# Patient Record
Sex: Female | Born: 1985 | Race: Black or African American | Hispanic: No | Marital: Married | Smoking: Never smoker
Health system: Southern US, Community
[De-identification: ages and names within clinical notes are randomized; demographics above are authoritative.]

## PROBLEM LIST (undated history)

## (undated) ENCOUNTER — Inpatient Hospital Stay (HOSPITAL_COMMUNITY): Payer: Self-pay

## (undated) HISTORY — PX: NO PAST SURGERIES: SHX2092

---

## 2008-01-25 ENCOUNTER — Inpatient Hospital Stay (HOSPITAL_COMMUNITY): Admission: AD | Admit: 2008-01-25 | Discharge: 2008-01-25 | Payer: Self-pay | Admitting: Obstetrics & Gynecology

## 2008-01-28 ENCOUNTER — Inpatient Hospital Stay (HOSPITAL_COMMUNITY): Admission: AD | Admit: 2008-01-28 | Discharge: 2008-01-28 | Payer: Self-pay | Admitting: Obstetrics and Gynecology

## 2008-02-06 ENCOUNTER — Inpatient Hospital Stay (HOSPITAL_COMMUNITY): Admission: RE | Admit: 2008-02-06 | Discharge: 2008-02-06 | Payer: Self-pay | Admitting: Obstetrics & Gynecology

## 2008-02-16 ENCOUNTER — Encounter: Payer: Self-pay | Admitting: Family Medicine

## 2008-03-02 ENCOUNTER — Encounter: Payer: Self-pay | Admitting: Family Medicine

## 2008-03-23 ENCOUNTER — Encounter: Payer: Self-pay | Admitting: Family Medicine

## 2008-08-05 ENCOUNTER — Ambulatory Visit: Payer: Self-pay | Admitting: Obstetrics & Gynecology

## 2008-08-05 ENCOUNTER — Inpatient Hospital Stay (HOSPITAL_COMMUNITY): Admission: AD | Admit: 2008-08-05 | Discharge: 2008-08-12 | Payer: Self-pay | Admitting: Obstetrics & Gynecology

## 2008-08-10 ENCOUNTER — Encounter: Payer: Self-pay | Admitting: Family

## 2008-12-13 ENCOUNTER — Encounter: Payer: Self-pay | Admitting: Family Medicine

## 2008-12-26 IMAGING — US US OB TRANSVAGINAL
1 series · 14 of 18 positions shown · non-contrast
Comparison: none

OBSTETRICAL ULTRASOUND:
 This ultrasound exam was performed in the [HOSPITAL] Ultrasound Department.  The OB US report was generated in the AS system, and faxed to the ordering physician.  This report is also available in [REDACTED] PACS.

[Series 1: us ob transvaginal · 0.11mm/px · 14 of 18 slices shown]
[im 1/18]
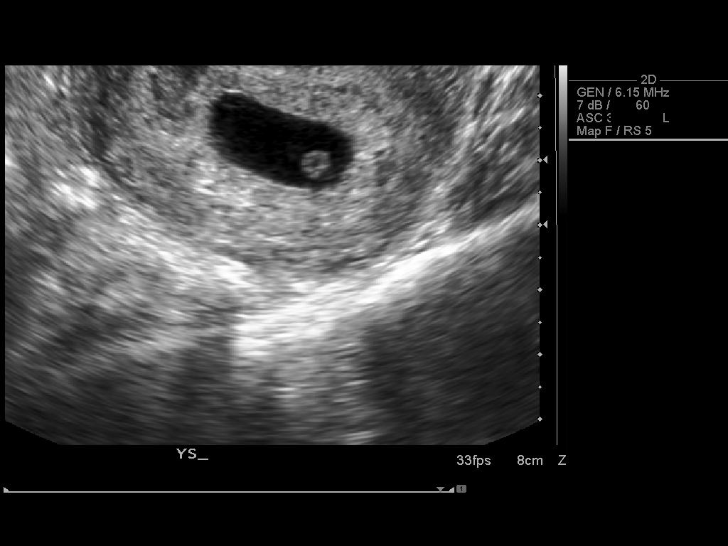
[im 2/18]
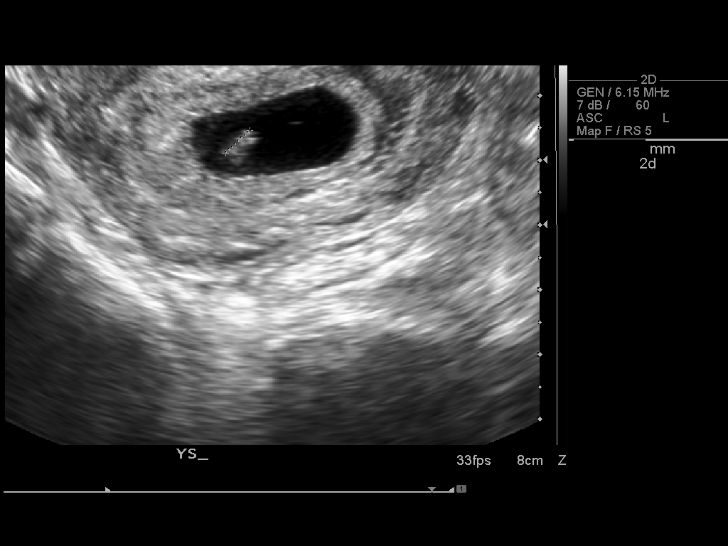
[im 4/18]
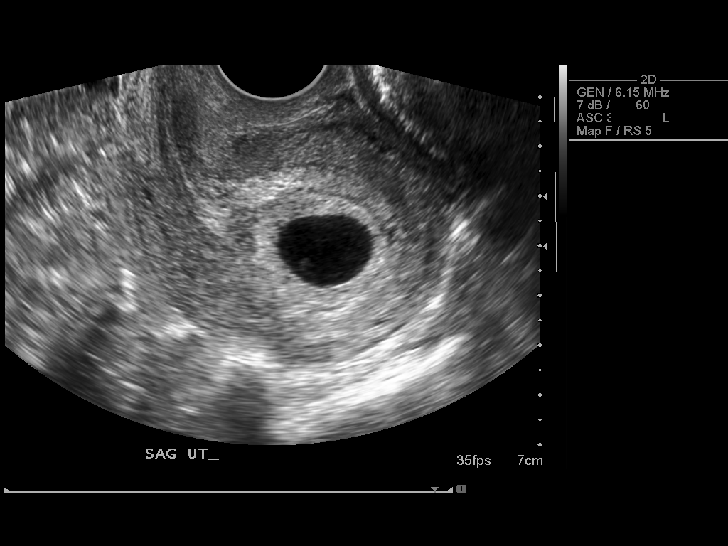
[im 5/18]
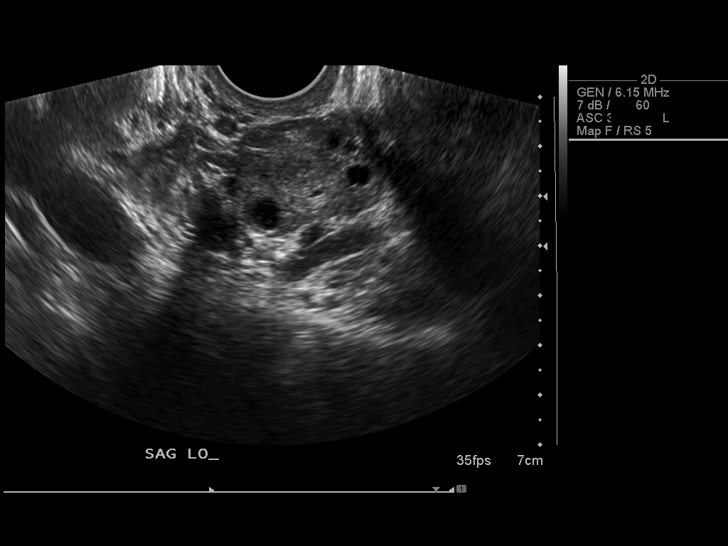
[im 6/18]
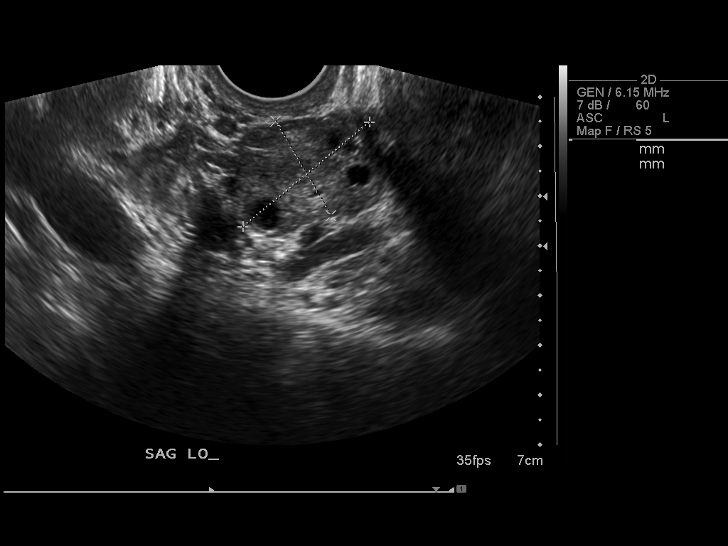
[im 8/18]
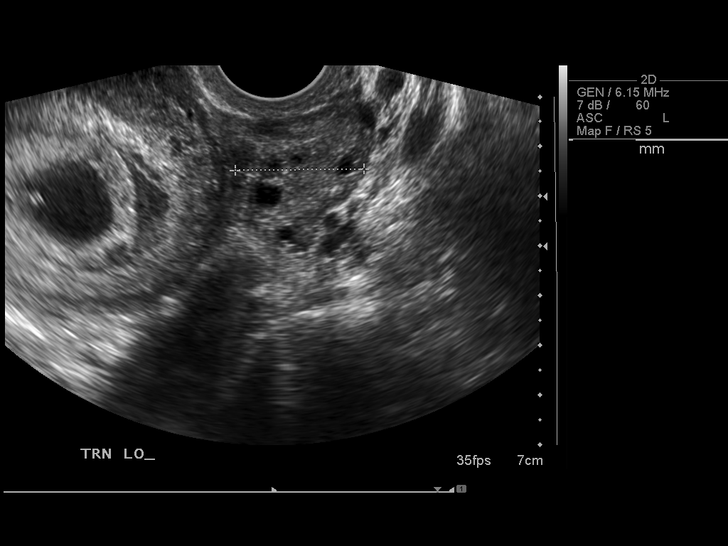
[im 9/18]
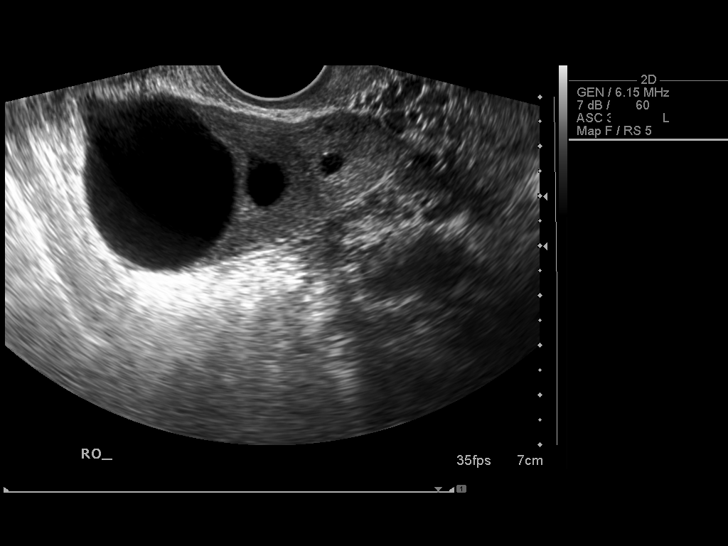
[im 10/18]
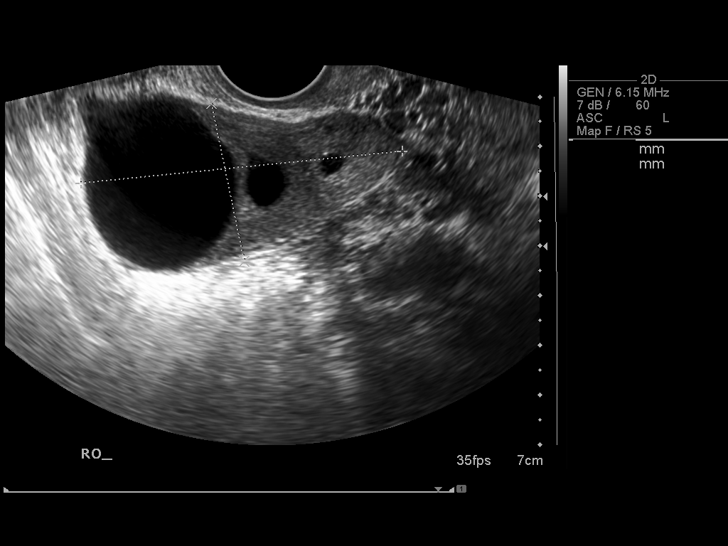
[im 11/18]
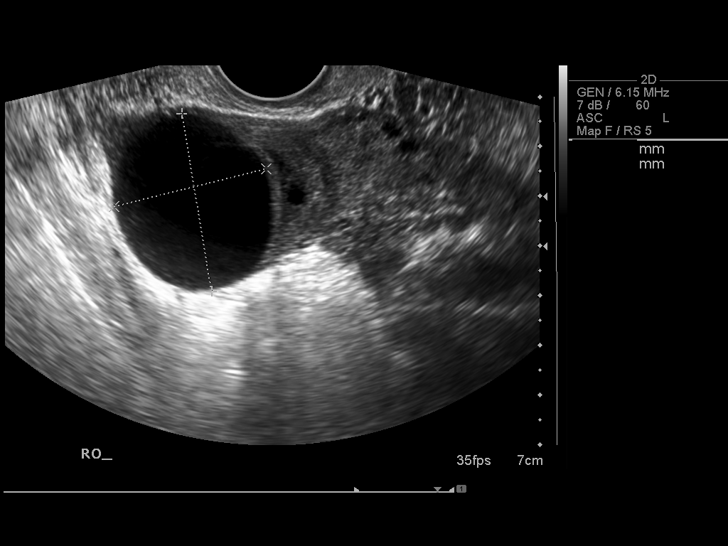
[im 13/18]
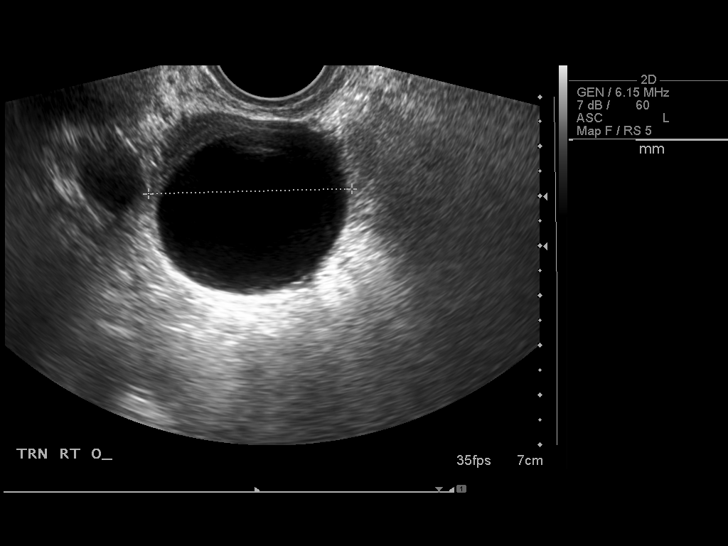
[im 14/18]
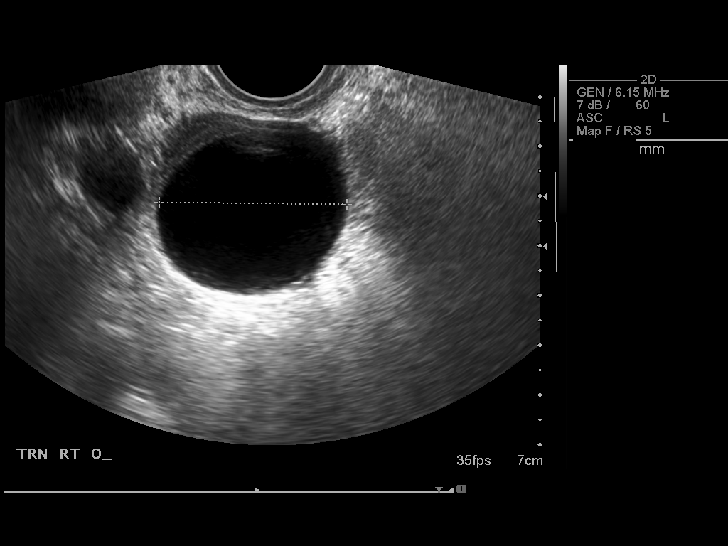
[im 15/18]
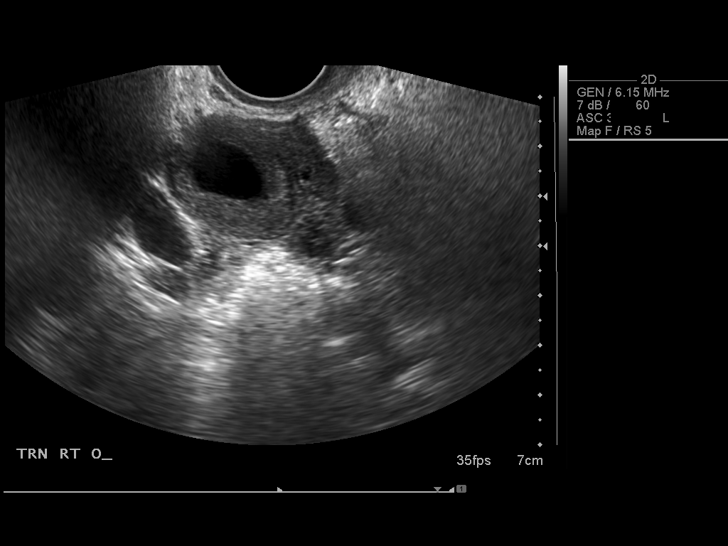
[im 17/18]
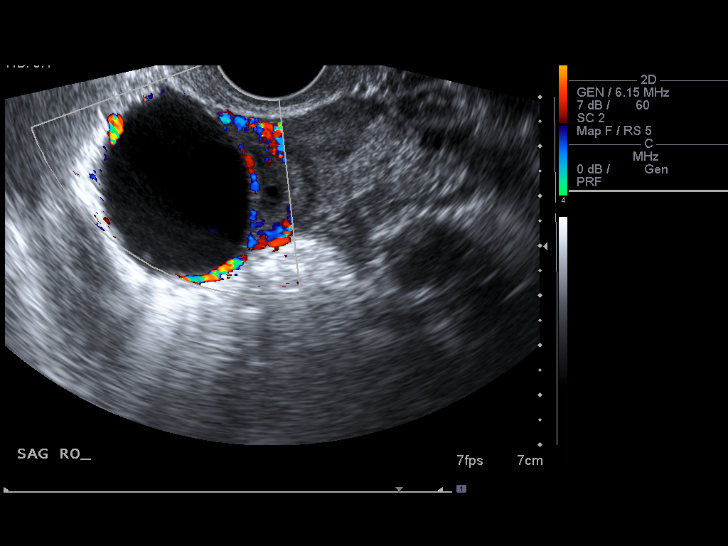
[im 18/18]
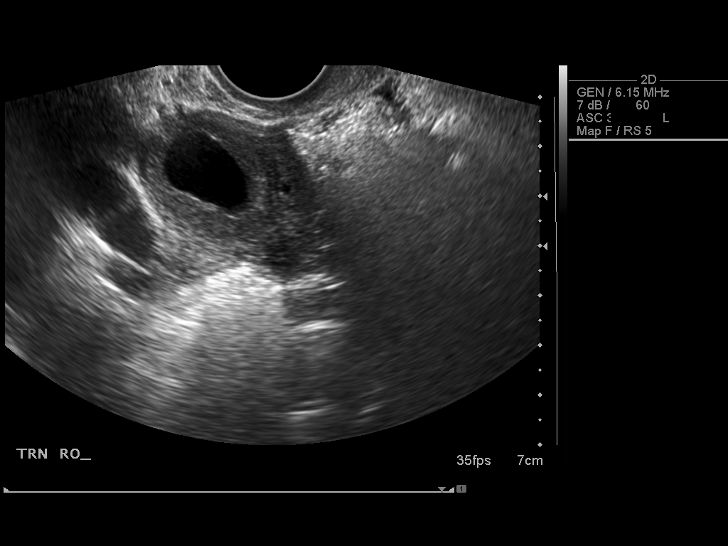

[14 of 18 positions shown; findings below may reference images not displayed]

IMPRESSION: See AS Obstetric US report.

## 2009-02-25 ENCOUNTER — Encounter: Payer: Self-pay | Admitting: Family Medicine

## 2009-03-14 ENCOUNTER — Emergency Department (HOSPITAL_COMMUNITY): Admission: EM | Admit: 2009-03-14 | Discharge: 2009-03-15 | Payer: Self-pay | Admitting: Emergency Medicine

## 2009-04-04 ENCOUNTER — Encounter (INDEPENDENT_AMBULATORY_CARE_PROVIDER_SITE_OTHER): Payer: Self-pay | Admitting: *Deleted

## 2009-05-08 ENCOUNTER — Encounter: Payer: Self-pay | Admitting: Family Medicine

## 2009-05-08 ENCOUNTER — Ambulatory Visit: Payer: Self-pay | Admitting: Family Medicine

## 2009-05-08 DIAGNOSIS — D352 Benign neoplasm of pituitary gland: Secondary | ICD-10-CM | POA: Insufficient documentation

## 2009-05-08 DIAGNOSIS — D353 Benign neoplasm of craniopharyngeal duct: Secondary | ICD-10-CM

## 2009-05-08 DIAGNOSIS — N912 Amenorrhea, unspecified: Secondary | ICD-10-CM

## 2009-05-08 LAB — CONVERTED CEMR LAB: Beta hcg, urine, semiquantitative: NEGATIVE

## 2009-05-20 ENCOUNTER — Telehealth: Payer: Self-pay | Admitting: Family Medicine

## 2009-06-12 ENCOUNTER — Encounter: Payer: Self-pay | Admitting: Family Medicine

## 2009-06-12 ENCOUNTER — Ambulatory Visit: Payer: Self-pay | Admitting: Family Medicine

## 2009-06-12 LAB — CONVERTED CEMR LAB: Prolactin: 29.4 ng/mL

## 2009-06-18 ENCOUNTER — Encounter: Payer: Self-pay | Admitting: Family Medicine

## 2009-07-08 ENCOUNTER — Encounter: Payer: Self-pay | Admitting: Family Medicine

## 2009-07-08 DIAGNOSIS — D573 Sickle-cell trait: Secondary | ICD-10-CM

## 2009-11-15 ENCOUNTER — Ambulatory Visit: Payer: Self-pay | Admitting: Family Medicine

## 2009-11-18 ENCOUNTER — Encounter: Payer: Self-pay | Admitting: Family Medicine

## 2010-01-01 ENCOUNTER — Encounter: Payer: Self-pay | Admitting: Family Medicine

## 2010-06-20 ENCOUNTER — Telehealth: Payer: Self-pay | Admitting: Family Medicine

## 2010-08-18 ENCOUNTER — Ambulatory Visit: Payer: Self-pay | Admitting: Family Medicine

## 2010-08-18 ENCOUNTER — Encounter: Payer: Self-pay | Admitting: Family Medicine

## 2010-08-18 DIAGNOSIS — E559 Vitamin D deficiency, unspecified: Secondary | ICD-10-CM | POA: Insufficient documentation

## 2010-08-18 LAB — CONVERTED CEMR LAB
ALT: 13 units/L (ref 0–35)
AST: 14 units/L (ref 0–37)
Albumin: 4.2 g/dL (ref 3.5–5.2)
Alkaline Phosphatase: 47 units/L (ref 39–117)
CO2: 25 meq/L (ref 19–32)
Calcium: 9.4 mg/dL (ref 8.4–10.5)
Creatinine, Ser: 0.71 mg/dL (ref 0.40–1.20)
HDL: 62 mg/dL (ref >39)
Potassium: 4.2 meq/L (ref 3.5–5.3)
Sodium: 140 meq/L (ref 135–145)
Total CHOL/HDL Ratio: 3.5
Total Protein: 7.3 g/dL (ref 6.0–8.3)
Triglycerides: 80 mg/dL (ref <150)

## 2010-08-21 ENCOUNTER — Ambulatory Visit: Payer: Self-pay | Admitting: Family Medicine

## 2010-08-21 ENCOUNTER — Encounter: Payer: Self-pay | Admitting: Family Medicine

## 2010-08-21 LAB — CONVERTED CEMR LAB: Beta hcg, urine, semiquantitative: NEGATIVE

## 2010-11-04 NOTE — Assessment & Plan Note (Signed)
Summary: gyn exam,fu/tcb   Vital Signs:  Patient profile:   25 year old female Height:      65.25 inches Weight:      122 pounds BMI:     20.22 BSA:     1.61 Temp:     98.5 degrees F Pulse rate:   94 / minute BP sitting:   127 / 75  Vitals Entered By: Jone Baseman CMA (November 15, 2009 9:25 AM) CC: Annual Gyn, Prolactinoma Is Patient Diabetic? No Pain Assessment Patient in pain? no        Primary Care Provider:  Delbert Harness MD  CC:  Annual Gyn and Prolactinoma.  History of Present Illness: 25 yo here for follow-up of pituitary adenoma and annual gynecological exam.  LMP:  currently ending today. Contraception: tri-sprintec- irregular periods due to bleeding when forgetting pills. Regular Menses: yes, when rememebers OCP FHx of Breast, Uterine, Cervical or Ovarian Cancer:  No Last Pap:  about a year ago?   Hx of Abnormal Pap:  No Desires STD testing: No Last Mammogram:  N/A Abnormalities on Self-exam:  No Hx of Abnormal Mammogram:  No  Pituitary Adenoma: Patient uncomfortable with not having endocrinology follow-up.  unable to find referral for her at last visit throught [facility] due to no insurance.  Patient says she is willing to pay for consultation.  Patient continues to be asymptomatic.   Mesntruating, no longer lactating (baby is now 74 months old)  No visual changes, headaches, dizziness, nausea, or other signs of mass effect. prolactin checked 06/12/09: 29.4   Habits & Providers  Alcohol-Tobacco-Diet     Tobacco Status: never  Allergies: No Known Drug Allergies  Review of Systems      See HPI General:  Denies fever, weakness, and weight loss. Eyes:  Denies blurring. CV:  Denies chest pain or discomfort and shortness of breath with exertion. Resp:  Denies cough and wheezing. GI:  Denies abdominal pain. GU:  Denies abnormal vaginal bleeding. Neuro:  Denies numbness, visual disturbances, and weakness.  Physical Exam  General:   Well-developed,well-nourished,in no acute distress; alert,appropriate and cooperative throughout examination Eyes:  No corneal or conjunctival inflammation noted. EOMI. Perrla.  Nose:  External nasal examination shows no deformity or inflammation. Nasal mucosa are pink and moist without lesions or exudates. Mouth:  Oral mucosa and oropharynx without lesions or exudates.  Teeth in good repair. Neck:  No deformities, masses, or tenderness noted. no masses.   Breasts:  No mass, nodules, thickening, tenderness, bulging, retraction, inflamation, nipple discharge or skin changes noted.   Lungs:  Normal respiratory effort, chest expands symmetrically. Lungs are clear to auscultation, no crackles or wheezes. Heart:  Normal rate and regular rhythm. S1 and S2 normal without gallop, murmur, click, rub or other extra sounds. Abdomen:  Bowel sounds positive,abdomen soft and non-tender without masses, organomegaly or hernias noted. Genitalia:  Pelvic Exam:        External: normal female genitalia without lesions or masses        Vagina: normal without lesions or masses.  Light menstration noted.        Cervix: normal without lesions or masses        Adnexa: normal bimanual exam without masses or fullness        Uterus: normal by palpation        Pap smear: performed Extremities:  no LE edema   Impression & Recommendations:  Problem # 1:  Gynecological examination-routine (ICD-V72.31) Performed pap as she is  now 23.  Discussed contraception options.  patient prefers somethign she does not have to remember daily or weekly and does not like idea of NuvaRing. Not doing well remembering OCP's.  Discussed opions of depo shot which caused breakthrough bleeding in the past for her vs Implanon vs Mirena.  Demonstarted Mirena and given informational pamphlet.  patient to think about options and contact health dept for pricing of implanon or offered depo or mirena placement here.  Continue OCP's for now  Advised  daily multivitamin for reproductive age female.  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  Orders: Endocrinology Referral (Endocrine)  Problem # 3:  CONTRACEPTIVE MANAGEMENT (ICD-V25.09) See #1  Complete Medication List: 1)  Sprintec 28 0.25-35 Mg-mcg Tabs (Norgestimate-eth estradiol) .... Take one tablet at the same time daily  Other Orders: Pap Smear-[facility] (91478-29562) [facility] - Est  18-39 yrs (13086)  Patient Instructions: 1)  Take a multivitamin daily 2)  Think about options: Mirena (here in office), vs Depo shot, vs Implanon (under skin). 3)  We will call you with endocrine referral info.  if you do not hear from [place] in 1-2 weeks, please call.  You wil likely have to pay out of pocket for this visit.

## 2010-11-04 NOTE — Miscellaneous (Signed)
Summary: Consent: Implanon Insertion  Consent: Implanon Insertion   Imported By: Knox Royalty 08/22/2010 12:11:00  _____________________________________________________________________  External Attachment:    Type:   Image     Comment:   External Document

## 2010-11-04 NOTE — Op Note (Signed)
Summary: Implanon Insertion  Implanon Insertion   Imported By: De Nurse 08/26/2010 11:18:14  _____________________________________________________________________  External Attachment:    Type:   Image     Comment:   External Document

## 2010-11-04 NOTE — Progress Notes (Signed)
Summary: refill  Phone Note Refill Request Call back at Home Phone [phone] Message from:  Patient  Refills Requested: Medication #1:  SPRINTEC 28 0.25-35 MG-MCG TABS Take one tablet at the same time daily. [facility] [address]  Initial call taken by: De Nurse,  June 20, 2010 8:33 AM    Prescriptions: SPRINTEC 28 0.25-35 MG-MCG TABS (NORGESTIMATE-ETH ESTRADIOL) Take one tablet at the same time daily  #1 pack x 5   Entered and Authorized by:   Delbert Harness MD   Signed by:   Delbert Harness MD on 06/20/2010   Method used:   Electronically to        [facility] [address] [id number]* (retail)       [address] [place], [place]  [address]       Ph: [phone]       Fax: [phone]   RxID:   [id number]

## 2010-11-04 NOTE — Assessment & Plan Note (Signed)
Summary: contraception,df   Vital Signs:  Patient profile:   25 year old female Height:      65.25 inches Weight:      124 pounds BMI:     20.55 Temp:     97.7 degrees F oral Pulse rate:   77 / minute BP sitting:   115 / 81  (right arm) Cuff size:   regular  Vitals Entered By: Tessie Fass CMA (August 18, 2010 8:54 AM) CC: change birth control Is Patient Diabetic? No Pain Assessment Patient in pain? no        Primary Care Elior Robinette:  Delbert Harness MD  CC:  change birth control.  History of Present Illness: 25 yo here to discuss contraception and fill out health forms  contraception: wants to switch from [facility] because difficult to remember every day. Also is in school to get bachelor's degree and wants a long term solution for contraception but does plan on having more children in the future.  Currenlty still on birth control pills.  Last intercourse 2 days ago.  forms:  needs wellness screening form filled out for her workplace  Habits & Providers  Alcohol-Tobacco-Diet     Tobacco Status: never  Current Medications (verified): 1)  Sprintec 28 0.25-35 Mg-Mcg Tabs (Norgestimate-Eth Estradiol) .... Take One Tablet At The Same Time Daily 2)  Vitamin D3 5000 Unit Caps (Cholecalciferol) .... One Tablet Daily- Otc Per Endocrinology  Allergies (verified): No Known Drug Allergies PMH-FH-SH reviewed for relevance  Review of Systems      See HPI  Physical Exam  General:  Well-developed,well-nourished,in no acute distress; alert,appropriate and cooperative throughout examination   Impression & Recommendations:  Problem # 1:  CONTRACEPTIVE MANAGEMENT (ICD-V25.09) Discussed birth control options.  Will plan on implanon this week.  Discussed risks side effects- given patient information to review.  Orders: Comp Met-[facility] ([id number]) [facility]- Est Level  3 (63875)  Problem # 2:  Preventive Health Care (ICD-V70.0)  Will get fasting lipids, glucose for her workplace wellness  form.  Orders: [facility]- Est Level  3 (64332)  Complete Medication List: 1)  Sprintec 28 0.25-35 Mg-mcg Tabs (Norgestimate-eth estradiol) .... Take one tablet at the same time daily 2)  Vitamin D3 5000 Unit Caps (Cholecalciferol) .... One tablet daily- otc per endocrinology  Other Orders: Lipid-[facility] ([id number]) Vit D, 25 OH-[facility] (63016-01093)  Patient Instructions: 1)  See implanon info 2)  Please schedule for this thursday at 10:15 for 30 minute spot   Orders Added: 1)  Lipid-[facility] [[id number]] 2)  Vit D, 25 OH-[facility] [82306-85810] 3)  Comp Met-[facility] [80053-22900] 4)  [facility]- Est Level  3 [23557]

## 2010-11-04 NOTE — Letter (Signed)
Summary: Results Follow-up Letter  [facility]  [address]   [place], [place] [address]   Phone: [phone]  Fax: [phone]    11/18/2009  [address] [place], [place]  [address]  [place]  Dear Ms. MILIUS,   The following are the results of your recent test(s):  Test     Result     Pap Smear    Normal  Sincerely,  Delbert Harness MD [facility]           Appended Document: Results Follow-up Letter mailed.

## 2010-11-04 NOTE — Consult Note (Signed)
Summary: Endocrinology Consult: prolactinoma  Endocrinology Consult   Imported By: De Nurse 01/13/2010 16:01:43  _____________________________________________________________________  External Attachment:    Type:   Image     Comment:   External Document

## 2010-11-04 NOTE — Assessment & Plan Note (Signed)
Summary: Implanon Insertion   Vital Signs:  Patient profile:   25 year old female Height:      65.25 inches Weight:      126 pounds BMI:     20.88 Temp:     98.6 degrees F oral Pulse rate:   64 / minute BP sitting:   119 / 78  (left arm) Cuff size:   regular  Vitals Entered By: Garen Grams LPN (August 21, 2010 10:33 AM) CC: Implanon Insertion Is Patient Diabetic? No Pain Assessment Patient in pain? no        Primary Care Provider:  Delbert Harness MD  CC:  Implanon Insertion.  History of Present Illness: 25 yo here for implanon insertion  patient has reveiwed infromation given at last visit.  She wishes to proceed.  We reviewed side effects which include irregular bleeding,  and possiblity of infect, need for surgical removal, etc as outlined in Implasnon consent form.  [facility] consent signed.    LMP 08/18/2010 Taking ortho tri cyclen currently  Habits & Providers  Alcohol-Tobacco-Diet     Tobacco Status: never  Current Medications (verified): 1)  Implanon 68 Mg Impl (Etonogestrel) .... Placed 08/21/2010 2)  Ergocalciferol 50000 Unit Caps (Ergocalciferol) .... Take One Tablet Weekly For 8 Weeks  Allergies: No Known Drug Allergies  Past History:  Past Medical History: Pituitary adenoma  Social History: Pt lives with her son Melina Modena) born in November 2009 and father of baby Charline Bills Diarassoaba).  She works full time in [employer] and is also a Consulting civil engineer.  She does not drink alcohol, use tobacco, or illicit drugs.  Originally from [place].  Review of Systems      See HPI  Physical Exam  General:  Well-developed,well-nourished,in no acute distress; alert,appropriate and cooperative throughout examination Additional Exam:  Procedure note:  consetrn discuss and signed.  Patient right hand dominant.  left upper arms prepped in sterile procedure.  2 ml 1% lidocaine placed into site in upper arm 8 cm from epicondyle.  Implanon inserted superficially  without complication.  implant palpated in proper position by both provider and patient.  good hemostasis, patient tolerated procesdue well.   Complete Medication List: 1)  Implanon 68 Mg Impl (Etonogestrel) .... Placed 08/21/2010 2)  Ergocalciferol 50000 Unit Caps (Ergocalciferol) .... Take one tablet weekly for 8 weeks  Other Orders: U Preg-[facility] (16109) Etonogestrel implant system, including implant and supplie (U0454)  Insertion implantable contraceptive capsules   (09811) Insertion, non biodegradable drug deliver implant, (91478)  Patient Instructions: 1)  Make appointment for annual gynecological exam in spring. 2)  Take vitamin d once a week of 8 weeksm then take 1000-2000 International Units per day-available over the counter Prescriptions: ERGOCALCIFEROL 50000 UNIT CAPS (ERGOCALCIFEROL) take one tablet weekly for 8 weeks  #8 x 0   Entered and Authorized by:   Delbert Harness MD   Signed by:   Delbert Harness MD on 08/21/2010   Method used:   Electronically to        [facility] [address] [id number]* (retail)       [address] [place], [place]  [address]       Ph: [phone]       Fax: [phone]   RxID:   [id number]    Orders Added: 1)  U Preg-[facility] [81025] 2)  Etonogestrel implant system, including implant and supplie [J7307] 3)   Insertion implantable contraceptive capsules   [11975] 4)  Insertion, non  biodegradable drug deliver implant, [id number]    Laboratory Results   Urine Tests  Date/Time Received: August 21, 2010 10:39 AM  Date/Time Reported: August 21, 2010 10:46 AM     Urine HCG: negative Comments: ...............test performed by......Marland KitchenBonnie A. [place], MLS (ASCP)cm

## 2010-12-10 ENCOUNTER — Ambulatory Visit (INDEPENDENT_AMBULATORY_CARE_PROVIDER_SITE_OTHER): Payer: 59 | Admitting: Family Medicine

## 2010-12-10 ENCOUNTER — Encounter: Payer: Self-pay | Admitting: Family Medicine

## 2010-12-10 VITALS — BP 113/85 | HR 83 | Temp 98.6°F | Wt 129.2 lb

## 2010-12-10 DIAGNOSIS — Z7184 Encounter for health counseling related to travel: Secondary | ICD-10-CM | POA: Insufficient documentation

## 2010-12-10 DIAGNOSIS — Z124 Encounter for screening for malignant neoplasm of cervix: Secondary | ICD-10-CM

## 2010-12-10 DIAGNOSIS — D353 Benign neoplasm of craniopharyngeal duct: Secondary | ICD-10-CM

## 2010-12-10 DIAGNOSIS — E559 Vitamin D deficiency, unspecified: Secondary | ICD-10-CM

## 2010-12-10 DIAGNOSIS — Z7189 Other specified counseling: Secondary | ICD-10-CM

## 2010-12-10 DIAGNOSIS — B373 Candidiasis of vulva and vagina: Secondary | ICD-10-CM

## 2010-12-10 DIAGNOSIS — Z01419 Encounter for gynecological examination (general) (routine) without abnormal findings: Secondary | ICD-10-CM

## 2010-12-10 LAB — PROLACTIN: Prolactin: 30.5 ng/mL

## 2010-12-10 LAB — CONVERTED CEMR LAB
Prolactin: 30.5 ng/mL
Vit D, 25-Hydroxy: 19 ng/mL — ABNORMAL LOW (ref 30–89)

## 2010-12-10 MED ORDER — FLUCONAZOLE 150 MG PO TABS: 150.0000 mg | ORAL_TABLET | Freq: Once | ORAL | Status: AC

## 2010-12-10 NOTE — Progress Notes (Signed)
  Subjective:    Patient ID: Sarah Prince, female    DOB: 21-Feb-1986, 25 y.o.   MRN: [id number]  HPI Annual Gynecological Exam   Wt Readings from Last 3 Encounters:  12/10/10 129 lb 3.2 oz (58.605 kg)  08/21/10 126 lb (57.153 kg)  08/18/10 124 lb (56.246 kg)   Last period:  Nov 2011 Regular periods: no  Heavy bleeding: no  Sexually active: yes, married Birth control or hormonal therapy:implanon Hx of STD: Patient desires STD screening Dyspareunia: No Hot flashes: No Vaginal discharge:  Dysuria:No   Last mammogram: N/A Breast mass or concerns: No Last Pap: Feb 2011, normal. History of abnormal pap: No  FH of breast, uterine, ovarian, colon cancer: No  Pituitary adenoma:  No visual changes, headaches, dizziness, nausea, or other signs of mass effect.  Amenorrhea secondary to implanon placement.   Review of Systems See HPI    Objective:   Physical Exam  Constitutional: She appears well-developed and well-nourished.  HENT:  Head: Normocephalic and atraumatic.  Right Ear: External ear normal.  Left Ear: External ear normal.  Nose: Nose normal.  Mouth/Throat: Oropharynx is clear and moist. No oropharyngeal exudate.  Eyes: Conjunctivae and EOM are normal. Pupils are equal, round, and reactive to light.       Fundoscopic exam normal  Neck: Normal range of motion. Neck supple. No thyromegaly present.  Cardiovascular: Normal rate, regular rhythm and normal heart sounds.   No murmur heard. Pulmonary/Chest: Effort normal and breath sounds normal. No respiratory distress. She has no wheezes. She has no rhonchi. She has no rales.  Abdominal: Soft. Bowel sounds are normal. There is no hepatosplenomegaly. There is no tenderness. There is no rebound and no guarding.  Genitourinary: Vagina normal and uterus normal. No breast tenderness or discharge. Cervix exhibits no motion tenderness, no discharge and no friability. Right adnexum displays no mass, no tenderness and no fullness. Left  adnexum displays no mass and no tenderness. No bleeding around the vagina.       Thick white discharge consistent with yeast  Lymphadenopathy:    She has no cervical adenopathy.          Assessment & Plan:

## 2010-12-10 NOTE — Assessment & Plan Note (Signed)
Asymtptomatic.  Will check prolactin.  If no significant increase, will check annually.  If significantly elevated, will refer back to endocrinology.

## 2010-12-10 NOTE — Assessment & Plan Note (Signed)
Finished course of weekly vit D and now only 1000 units daily.   Will recheck today.

## 2010-12-10 NOTE — Assessment & Plan Note (Signed)
Today's pap will make 3 normal paps.  Will change to every other year cervical cancer screening.  Continue implanon for contraception, patient happy.  Follow-up in 1 year or sooner if needed.

## 2010-12-10 NOTE — Patient Instructions (Signed)
See recommendations for vaccinations before you go to [place] again. I will send you a letter if labs results normal, otherwise I will call you. Follow-up in 1 year or sooner if needed

## 2010-12-11 ENCOUNTER — Telehealth: Payer: Self-pay | Admitting: Family Medicine

## 2010-12-11 MED ORDER — ERGOCALCIFEROL 1.25 MG (50000 UT) PO CAPS: 50000.0000 [IU] | ORAL_CAPSULE | ORAL | Status: DC

## 2010-12-11 NOTE — Telephone Encounter (Signed)
Spoke with patient about low vitamin D.  Advised I will send in another 8 weeks of vitamin D and advised to take 2000 units daily instead of 1000 after this.  Prolactin level stable, continue annual monitoring.

## 2011-01-12 LAB — URINALYSIS, ROUTINE W REFLEX MICROSCOPIC
Bilirubin Urine: NEGATIVE
Glucose, UA: NEGATIVE mg/dL
Hgb urine dipstick: NEGATIVE
Specific Gravity, Urine: 1.01 (ref 1.005–1.030)

## 2011-01-12 LAB — URINE MICROSCOPIC-ADD ON

## 2011-01-12 LAB — POCT PREGNANCY, URINE: Preg Test, Ur: NEGATIVE

## 2011-01-19 ENCOUNTER — Encounter: Payer: Self-pay | Admitting: Family Medicine

## 2011-01-19 ENCOUNTER — Ambulatory Visit (INDEPENDENT_AMBULATORY_CARE_PROVIDER_SITE_OTHER): Payer: 59 | Admitting: Family Medicine

## 2011-01-19 VITALS — BP 108/64 | HR 102 | Temp 98.5°F | Ht 66.54 in | Wt 129.0 lb

## 2011-01-19 DIAGNOSIS — Z7184 Encounter for health counseling related to travel: Secondary | ICD-10-CM

## 2011-01-19 DIAGNOSIS — N760 Acute vaginitis: Secondary | ICD-10-CM

## 2011-01-19 DIAGNOSIS — B373 Candidiasis of vulva and vagina: Secondary | ICD-10-CM

## 2011-01-19 DIAGNOSIS — Z7189 Other specified counseling: Secondary | ICD-10-CM

## 2011-01-19 LAB — POCT WET PREP (WET MOUNT)
Clue Cells Wet Prep HPF POC: NEGATIVE
Trichomonas Wet Prep HPF POC: NEGATIVE

## 2011-01-19 MED ORDER — FLUCONAZOLE 150 MG PO TABS: 150.0000 mg | ORAL_TABLET | Freq: Once | ORAL | Status: AC

## 2011-01-19 NOTE — Progress Notes (Signed)
  Subjective:    Patient ID: Sarah Prince, female    DOB: 02-Jul-1986, 25 y.o.   MRN: [id number]  HPIIncreased vaginal spotting, Implanon since November.  Does not have periods, just brown spotting that is irregular.  She is going to [place] in [place] in June and would like to know what immunizations that she needs for her and her son.  She is up to date on all recommended for the [place], yellow fever, and meningococcus  Will need to be determined by the health department.  She and son will also need malaria prophylaxis.       Review of Systems  Constitutional: Negative for fever and chills.  Genitourinary: Positive for vaginal bleeding, vaginal discharge and menstrual problem. Negative for dysuria, vaginal pain and dyspareunia.       Objective:   Physical Exam  Constitutional: She appears well-developed and well-nourished.  Genitourinary: Uterus normal. Vaginal discharge found.       Brown discharge from cervix, vaginal walls red and moist.          Assessment & Plan:

## 2011-01-19 NOTE — Assessment & Plan Note (Signed)
Treat with oral fluconazole and one refill

## 2011-01-19 NOTE — Patient Instructions (Addendum)
Please contact the health department at [phone] to determine if you need yellow fever and malaria prophylaxis  They will be able to determine all your needs   Implanon is associated with irregular spotting, this will likely occur the duration of it presence (3 years)

## 2011-01-19 NOTE — Assessment & Plan Note (Signed)
Reviewed immunizations recommended, and her own record she will likely need yellow fever and malaria prophylaxis, referred to the [facility] Department for this.

## 2011-01-20 ENCOUNTER — Ambulatory Visit: Payer: 59 | Admitting: Family Medicine

## 2011-02-20 NOTE — Discharge Summary (Signed)
NAME:  SHAKEENA, KAFER                  ACCOUNT NO.:  [id number]   MEDICAL RECORD NO.:  [id number]          PATIENT TYPE:  INP   LOCATION:  9310                          FACILITY:  [facility]   PHYSICIAN:  Tanya S. Shawnie Pons, M.D.   DATE OF BIRTH:  1986-01-25   DATE OF ADMISSION:  08/05/2008  DATE OF DISCHARGE:  08/12/2008                               DISCHARGE SUMMARY   REASON FOR ADMISSION:  Pregnancy at 32 weeks and 3 days with premature  rupture of membranes.   DISCHARGE DIAGNOSIS:  Pregnancy, delivered on August 10, 2008,  premature viable female child without any complications.   HOSPITAL COURSE:  The patient was admitted on August 05, 2008, and  given preterm rupture of membrane protocol.  The patient received IV  antibiotics, betamethasone 12 mg IM, and that was repeated 24 hours  later.  She essentially had an uneventful course and spontaneous labor  on August 10, 2008, and had an uneventful delivery at that time of a  viable female child.  Postpartum period was uneventful without any  complication and on August 12, 2008, the patient desired discharge  home.   PHYSICAL EXAMINATION:  Vital signs were stable.  Lungs were clear to  auscultation bilaterally.  Fundus was firm.  Lochia small amount.  Abdomen was soft and nontender.  There was no edema of the lower  extremities.   ASSESSMENT:  Postpartum day #2 stable and she was discharged home to  follow up in 6 weeks at [facility].  She was  undecided about her contraception, so at this time she decided no  contraception was needed until she follows up at the Health Department  for her postpartum checkup.      Zerita Boers, N.M.      Shelbie Proctor. Shawnie Pons, M.D.  Electronically Signed    DL/MEDQ  D:  09/81/1914  T:  09/10/2008  Job:  [id number]

## 2011-06-30 LAB — URINALYSIS, ROUTINE W REFLEX MICROSCOPIC
Bilirubin Urine: NEGATIVE
Ketones, ur: NEGATIVE

## 2011-06-30 LAB — CBC
Hemoglobin: 13.1
MCHC: 34.3

## 2011-06-30 LAB — HCG, QUANTITATIVE, PREGNANCY: hCG, Beta Chain, Quant, S: 8903 — ABNORMAL HIGH

## 2011-06-30 LAB — POCT PREGNANCY, URINE
Operator id: 202651
Preg Test, Ur: POSITIVE

## 2011-06-30 LAB — WET PREP, GENITAL: Trich, Wet Prep: NONE SEEN

## 2011-07-07 LAB — COMPREHENSIVE METABOLIC PANEL
AST: 18
Albumin: 3 — ABNORMAL LOW
BUN: 7
Chloride: 105
Creatinine, Ser: 0.44
GFR calc Af Amer: >60
Potassium: 3.7
Total Bilirubin: 0.5
Total Protein: 6.6

## 2011-07-07 LAB — CBC
Hemoglobin: 12.8
MCHC: 33.6
MCV: 90.3
Platelets: 168
Platelets: 205
RDW: 13.1
RDW: 13.2
WBC: 11.4 — ABNORMAL HIGH

## 2011-07-07 LAB — STREP B DNA PROBE: Strep Group B Ag: NEGATIVE

## 2011-07-07 LAB — GC/CHLAMYDIA PROBE AMP, GENITAL
Chlamydia, DNA Probe: NEGATIVE
GC Probe Amp, Genital: NEGATIVE

## 2011-07-21 ENCOUNTER — Ambulatory Visit: Payer: 59 | Admitting: Family Medicine

## 2011-09-25 ENCOUNTER — Ambulatory Visit: Payer: 59 | Admitting: Family Medicine

## 2011-09-30 ENCOUNTER — Ambulatory Visit: Payer: 59 | Admitting: Family Medicine

## 2011-10-30 ENCOUNTER — Emergency Department (HOSPITAL_COMMUNITY): Payer: BC Managed Care – HMO

## 2011-10-30 ENCOUNTER — Encounter (HOSPITAL_COMMUNITY): Payer: Self-pay | Admitting: *Deleted

## 2011-10-30 ENCOUNTER — Encounter: Payer: Self-pay | Admitting: Family Medicine

## 2011-10-30 ENCOUNTER — Ambulatory Visit (INDEPENDENT_AMBULATORY_CARE_PROVIDER_SITE_OTHER): Payer: BC Managed Care – HMO | Admitting: Family Medicine

## 2011-10-30 ENCOUNTER — Emergency Department (HOSPITAL_COMMUNITY)
Admission: EM | Admit: 2011-10-30 | Discharge: 2011-10-31 | Disposition: A | Discharge status: Home / Self Care | Payer: BC Managed Care – HMO | Attending: Emergency Medicine | Admitting: Emergency Medicine

## 2011-10-30 VITALS — BP 114/72 | HR 111 | Temp 99.8°F | Ht 66.5 in | Wt 137.0 lb

## 2011-10-30 DIAGNOSIS — J069 Acute upper respiratory infection, unspecified: Secondary | ICD-10-CM

## 2011-10-30 DIAGNOSIS — R630 Anorexia: Secondary | ICD-10-CM | POA: Insufficient documentation

## 2011-10-30 DIAGNOSIS — R070 Pain in throat: Secondary | ICD-10-CM | POA: Insufficient documentation

## 2011-10-30 DIAGNOSIS — R5381 Other malaise: Secondary | ICD-10-CM | POA: Insufficient documentation

## 2011-10-30 DIAGNOSIS — N390 Urinary tract infection, site not specified: Secondary | ICD-10-CM | POA: Insufficient documentation

## 2011-10-30 DIAGNOSIS — Z3009 Encounter for other general counseling and advice on contraception: Secondary | ICD-10-CM

## 2011-10-30 DIAGNOSIS — R509 Fever, unspecified: Secondary | ICD-10-CM | POA: Insufficient documentation

## 2011-10-30 DIAGNOSIS — R519 Headache, unspecified: Secondary | ICD-10-CM | POA: Insufficient documentation

## 2011-10-30 DIAGNOSIS — R51 Headache: Secondary | ICD-10-CM | POA: Insufficient documentation

## 2011-10-30 DIAGNOSIS — R05 Cough: Secondary | ICD-10-CM | POA: Insufficient documentation

## 2011-10-30 DIAGNOSIS — Z309 Encounter for contraceptive management, unspecified: Secondary | ICD-10-CM

## 2011-10-30 DIAGNOSIS — R059 Cough, unspecified: Secondary | ICD-10-CM | POA: Insufficient documentation

## 2011-10-30 DIAGNOSIS — R5383 Other fatigue: Secondary | ICD-10-CM | POA: Insufficient documentation

## 2011-10-30 DIAGNOSIS — R350 Frequency of micturition: Secondary | ICD-10-CM

## 2011-10-30 LAB — BASIC METABOLIC PANEL
BUN: 7 mg/dL (ref 6–23)
CO2: 28 mEq/L (ref 19–32)
Calcium: 9.2 mg/dL (ref 8.4–10.5)
Chloride: 100 mEq/L (ref 96–112)
Creatinine, Ser: 0.75 mg/dL (ref 0.50–1.10)
GFR calc Af Amer: >90 mL/min (ref >90)
GFR calc non Af Amer: >90 mL/min (ref >90)
Glucose, Bld: 109 mg/dL — ABNORMAL HIGH (ref 70–99)
Potassium: 3.2 mEq/L — ABNORMAL LOW (ref 3.5–5.1)
Sodium: 136 mEq/L (ref 135–145)

## 2011-10-30 LAB — URINALYSIS, ROUTINE W REFLEX MICROSCOPIC
Bilirubin Urine: NEGATIVE
Glucose, UA: NEGATIVE mg/dL
Ketones, ur: NEGATIVE mg/dL
Nitrite: NEGATIVE
Protein, ur: 30 mg/dL — AB
Specific Gravity, Urine: 1.011 (ref 1.005–1.030)
Urobilinogen, UA: 4 mg/dL — ABNORMAL HIGH (ref 0.0–1.0)
pH: 6.5 (ref 5.0–8.0)

## 2011-10-30 LAB — POCT URINALYSIS DIPSTICK
Nitrite, UA: POSITIVE
Protein, UA: 100
Urobilinogen, UA: 1
pH, UA: 6

## 2011-10-30 LAB — CBC
HCT: 33.8 % — ABNORMAL LOW (ref 36.0–46.0)
Hemoglobin: 11.9 g/dL — ABNORMAL LOW (ref 12.0–15.0)
MCH: 28.1 pg (ref 26.0–34.0)
MCHC: 35.2 g/dL (ref 30.0–36.0)
MCV: 79.9 fL (ref 78.0–100.0)
Platelets: 214 10*3/uL (ref 150–400)
RBC: 4.23 MIL/uL (ref 3.87–5.11)
RDW: 12.6 % (ref 11.5–15.5)
WBC: 9.6 10*3/uL (ref 4.0–10.5)

## 2011-10-30 LAB — DIFFERENTIAL
Basophils Absolute: 0 10*3/uL (ref 0.0–0.1)
Basophils Relative: 0 % (ref 0–1)
Eosinophils Absolute: 0 10*3/uL (ref 0.0–0.7)
Eosinophils Relative: 0 % (ref 0–5)
Lymphocytes Relative: 15 % (ref 12–46)
Lymphs Abs: 1.4 10*3/uL (ref 0.7–4.0)
Monocytes Absolute: 1.3 10*3/uL — ABNORMAL HIGH (ref 0.1–1.0)
Monocytes Relative: 13 % — ABNORMAL HIGH (ref 3–12)
Neutro Abs: 6.9 10*3/uL (ref 1.7–7.7)
Neutrophils Relative %: 72 % (ref 43–77)

## 2011-10-30 LAB — POCT URINE PREGNANCY: Preg Test, Ur: NEGATIVE

## 2011-10-30 LAB — POCT UA - MICROSCOPIC ONLY

## 2011-10-30 LAB — URINE MICROSCOPIC-ADD ON

## 2011-10-30 LAB — RAPID STREP SCREEN (MED CTR MEBANE ONLY): Streptococcus, Group A Screen (Direct): NEGATIVE

## 2011-10-30 MED ORDER — SODIUM CHLORIDE 0.9 % IV BOLUS (SEPSIS)
1000.0000 mL | Freq: Once | INTRAVENOUS | Status: AC
  Administered 2011-10-30: 1000 mL via INTRAVENOUS

## 2011-10-30 MED ORDER — IBUPROFEN 800 MG PO TABS
800.0000 mg | ORAL_TABLET | Freq: Once | ORAL | Status: AC
  Administered 2011-10-30: 800 mg via ORAL
  Filled 2011-10-30: qty 1

## 2011-10-30 MED ORDER — FLUCONAZOLE 150 MG PO TABS: 150.0000 mg | ORAL_TABLET | Freq: Once | ORAL | Status: DC

## 2011-10-30 MED ORDER — ACETAMINOPHEN 500 MG PO TABS
1000.0000 mg | ORAL_TABLET | Freq: Once | ORAL | Status: AC
  Administered 2011-10-30: 1000 mg via ORAL
  Filled 2011-10-30: qty 2

## 2011-10-30 MED ORDER — CEPHALEXIN 500 MG PO CAPS: 500.0000 mg | ORAL_CAPSULE | Freq: Two times a day (BID) | ORAL | Status: AC

## 2011-10-30 NOTE — Patient Instructions (Addendum)
It is normal to have a headache with a cold.  Please seek medical care if headache becomes severe, change in vision, numbness, tingling, weakness, or if continues for a week  Take a prenatal vitamin every day to help protect your future pregnancy  You can buy pregnancy tests over the counter.- even from some dollar stores.  Due for annual check in mid march.  See medicine for urinary tract infection   Preparing for Pregnancy Preparing for pregnancy (preconceptual care) by getting counseling and information from your caregiver before getting pregnant is a good idea. It will help you and your baby have a better chance to have a healthy, safe pregnancy and delivery of your baby. Make an appointment with your caregiver to talk about your health, medical, and family history and how to prepare yourself before getting pregnant. Your caregiver will do a complete physical exam and a Pap test. They will want to know:  About you, your spouse or partner, and your family's medical and genetic history.     If you are eating a balanced diet and drinking enough fluids.     What vitamins and mineral supplements you are taking. This includes taking folic acid before getting pregnant to help prevent birth defects.     What medications you are taking including prescription, over-the-counter and herbal medications.     If there is any substance abuse like alcohol, smoking, and illegal drugs.     If there is any mental or physical domestic violence.     If there is any risk of sexually transmitted disease between you and your partner.     What immunizations and vaccinations you have had and what you may need before getting pregnant.     If you should get tested for HIV infection.     If there is any exposure to chemical or toxic substances at home or work.     If there are medical problems you have that need to be treated and kept under control before getting pregnant such as diabetes, high blood pressure  or others.     If there were any past surgeries, pregnancies and problems with them.     What your current weight is and to set a goal as to how much weight you should gain while pregnant. Also, they will check if you should lose or gain weight before getting pregnant.     What is your exercise routine and what it is safe when you are pregnant.     If there are any physical disabilities that need to be addressed.     About spacing your pregnancies when there are other children.     If there is a financial problem that may affect you having a child.  After talking about the above points with your caregiver, your caregiver will give you advice on how to help treat and work with you on solving any issues, if necessary, before getting pregnant. The goal is to have a healthy and safe pregnancy for you and your baby. You should keep an accurate record of your menstrual periods because it will help in determining your due date. Immunizations that you should have before getting pregnant:    Regular measles, [place] measles (rubella) and mumps.     Tetanus and diphtheria.     Chickenpox, if not immune.     Herpes zoster (Varicella) if not immune.     Human papilloma virus vaccine (HPV) between the age of 50 and 24  years old.     Hepatitis A vaccine.     Hepatitis B vaccine.     Influenza vaccine.     Pneumococcal vaccine (pneumonia).  You should avoid getting pregnant for one month after getting vaccinated with a live virus vaccine such as [place] measles (rubella) vaccine. Other immunizations may be necessary depending on where you live, such as malaria. Ask your caregiver if any other immunizations are needed for you. HOME CARE INSTRUCTIONS    Follow the advice of your caregiver.     Before getting pregnant:     Begin taking vitamins, supplements, and 0.4 milligrams folic acid daily.     Get your immunizations up-to-date.     Get help from a nutrition counselor if you do not  understand what a balanced diet is, need help with a special medical diet or if you need help to lose or gain weight.     Begin exercising.     Stop smoking, taking illegal drugs, and drinking alcoholic beverages.     Get counseling if there is and type of domestic violence.     Get checked for sexually transmitted diseases including HIV.     Get any medical problems under control (diabetes, high blood pressure, convulsions, asthma or others).     Resolve any financial concerns.     Be sure you and your spouse or partner are ready to have a baby.     Keep an accurate record of your menstrual periods.  Document Released: 09/03/2008 Document Revised: 06/03/2011 Document Reviewed: 09/03/2008 [facility] Patient Information 2012 [place], [place].

## 2011-10-30 NOTE — ED Notes (Signed)
The pt saw her doctor earlier today and she was given kelflex.  She has not taken any tylenol or advil in the past few hours

## 2011-10-30 NOTE — Progress Notes (Signed)
  Subjective:    Patient ID: Sarah Prince, female    DOB: 1986-02-01, 26 y.o.   MRN: [id number]  HPI  Would like to take a urine pregnancy test and discuss headache  [facility].  Had a positive pregnancy test in mid December, than had a period December 25th.  Is trying to get pregnant.  Not taking PNV.  Headache:  3 days of sinus pressure, mild cough, feeling hot, feeling of drainage down back of throat.  Not abd pain, change in vision, numbness, tingling, weakness.  Has been taking tylenol several times a day,which helps but it very concerned with using tylenol for 3 days.  Urinary frequency:  For past week has had to urinate every few hours  No dysuria, incontinence.  NO bladder pressure or pain.  Review of Systems See above    Objective:   Physical Exam GEN: Alert & Oriented, No acute distress.  No neck stiffness. HEENT: [place]/AT. EOMI, PERRLA, no conjunctival injection or scleral icterus.  Bilateral tympanic membranes intact without erythema or effusion.  .  Nares without edema or rhinorrhea.  Oropharynx is without erythema or exudates.  CV:  Regular Rate & Rhythm, no murmur Respiratory:  Normal work of breathing, CTAB Abd:  + BS, soft, no tenderness to palpation NEURO:  Normal EOMI< PERRLA>  Gait normal.        Assessment & Plan:

## 2011-10-30 NOTE — ED Notes (Signed)
Pt ambulated to RR for CCUS. 

## 2011-10-30 NOTE — Assessment & Plan Note (Addendum)
Keflex x 7 days, will culture. Also treating yeast in urine

## 2011-10-30 NOTE — ED Provider Notes (Signed)
History     CSN: [id number]  Arrival date & time 10/30/11  2102   First MD Initiated Contact with Patient 10/30/11 2141      Chief Complaint  Patient presents with  . Headache    (Consider location/radiation/quality/duration/timing/severity/associated sxs/prior treatment)  HPI 26 year old AA female presents with headache that began about 3 days ago. The onset was gradual. She denies nausea and photophobia. She describes the headache as bilateral, temporal, and intermittent. She also has a sore throat, fever, chills, and cough. She denies nasal congestion and sinus pressure. She reports poor appetite in the past 3 days. She went to her PCP earlier today who gave her a Rx for Keflex for UTI. She denies any dysuria, abdominal pain, nausea, and vomiting. She reports that she noticed her urine was malodorous.   Past Medical History  Diagnosis Date  . Prolactinoma     History reviewed. No pertinent past surgical history.  Family History  Problem Relation Age of Onset  . Diabetes Mother     History  Substance Use Topics  . Smoking status: Never Smoker   . Smokeless tobacco: Not on file  . Alcohol Use: No    OB History    Grav Para Term Preterm Abortions TAB SAB Ect Mult Living                  Review of Systems  Constitutional: Positive for fever, chills, appetite change and fatigue.  HENT: Positive for sore throat. Negative for hearing loss, ear pain, congestion, rhinorrhea, sneezing, neck pain, neck stiffness and sinus pressure.   Eyes: Negative for photophobia and visual disturbance.  Respiratory: Positive for cough. Negative for chest tightness and shortness of breath.   Cardiovascular: Negative for chest pain and palpitations.  Gastrointestinal: Negative for nausea, vomiting, abdominal pain and diarrhea.  Genitourinary: Negative for dysuria.  Neurological: Positive for weakness and headaches. Negative for dizziness.    Allergies  Review of patient's allergies  indicates no known allergies.  Home Medications   Current Outpatient Rx  Name Route Sig Dispense Refill  . CEPHALEXIN 500 MG PO CAPS Oral Take 1 capsule (500 mg total) by mouth 2 (two) times daily. 14 capsule 0  . VITAMIN D 1000 UNITS PO TABS Oral Take 1,000 Units by mouth daily.      . ERGOCALCIFEROL 50000 UNITS PO CAPS Oral Take 1 capsule (50,000 Units total) by mouth once a week. 8 capsule 0    BP 106/66  Pulse 124  Temp(Src) 103.1 F (39.5 C) (Oral)  Resp 20  SpO2 100%  LMP 09/29/2011  Physical Exam  Constitutional: She is oriented to person, place, and time. She appears well-developed and well-nourished. No distress.  HENT:  Head: Normocephalic and atraumatic.  Nose: Mucosal edema present. Right sinus exhibits no maxillary sinus tenderness and no frontal sinus tenderness. Left sinus exhibits no maxillary sinus tenderness and no frontal sinus tenderness.  Mouth/Throat: Posterior oropharyngeal erythema present.  Eyes: Conjunctivae are normal. Pupils are equal, round, and reactive to light. Right eye exhibits no discharge. Left eye exhibits no discharge.  Neck: Normal range of motion. Neck supple.  Cardiovascular: Normal rate, regular rhythm, normal heart sounds and intact distal pulses.   Pulmonary/Chest: Effort normal and breath sounds normal. No respiratory distress. She has no wheezes. She has no rales. She exhibits no tenderness.  Abdominal: Soft. Bowel sounds are normal. She exhibits no distension. There is no tenderness.  Musculoskeletal: Normal range of motion.  Neurological: She is  alert and oriented to person, place, and time.  Skin: Skin is warm and dry. She is not diaphoretic.  Psychiatric: She has a normal mood and affect. Her behavior is normal.     ED Course  Procedures (including critical care time)  Labs Reviewed - No data to display  11:35 PM Checked on patient. She states the headache has improved some with Tylenol, Advil, and 1 bolus of NS. Patient's  blood pressure was 84/38. Will give another bolus of fluids and recheck.   The patient seems to several issues going on.  Most likely cause of her fevers and upper respiratory tract infection.  Her urine tonight did not show significant signs of infection but there is a culture pending from earlier urine.  Advised to continue taking the Keflex.  Patient is agreeable to the plan and all questions were answered.  She is advised to increase her fluid intake and return here for any worsening in her symptoms.  Patient has not had any nausea or vomiting per her report display with the nursing note states.  MDM  MDM Reviewed: nursing note and vitals Interpretation: labs and x-ray    Medical screening examination/treatment/procedure(s) were performed by non-physician practitioner and as supervising physician I was immediately available for consultation/collaboration. Osvaldo Human, M.D.        Carlyle Dolly, PA-C 10/31/11 0054  Carleene Cooper III, MD 10/31/11 1019

## 2011-10-30 NOTE — Assessment & Plan Note (Signed)
Trying to get pregnant.  Advised starting PNV.

## 2011-10-30 NOTE — ED Notes (Signed)
MD at bedside. 

## 2011-10-30 NOTE — ED Notes (Signed)
Pt changing into gown

## 2011-10-30 NOTE — Assessment & Plan Note (Signed)
Headache with sinus pressure and post nasal drainage of 3 days in duration.  No red flags.  Discussed reasons for further workup and will need repeat monitoring of prolactin in March for history of pituitary adenoma.  At this point would not pursue any further imaging.  I have asked to return if persists longer than a week, or has new symptoms.

## 2011-10-30 NOTE — ED Notes (Signed)
Headache for 3 days with nv 

## 2011-10-31 MED ORDER — KETOROLAC TROMETHAMINE 30 MG/ML IJ SOLN
30.0000 mg | Freq: Once | INTRAMUSCULAR | Status: AC
  Administered 2011-10-31: 30 mg via INTRAVENOUS
  Filled 2011-10-31: qty 1

## 2011-10-31 MED ORDER — IBUPROFEN 800 MG PO TABS: 800.0000 mg | ORAL_TABLET | Freq: Three times a day (TID) | ORAL | Status: AC | PRN

## 2011-10-31 NOTE — Discharge Instructions (Signed)
Take Tylenol and Motrin for fever.  Continue the antibiotics.  Increase your fluid intake.

## 2011-11-02 ENCOUNTER — Ambulatory Visit: Payer: 59 | Admitting: Family Medicine

## 2011-11-02 LAB — URINE CULTURE: Colony Count: >=100000

## 2011-12-10 ENCOUNTER — Ambulatory Visit (INDEPENDENT_AMBULATORY_CARE_PROVIDER_SITE_OTHER): Payer: BC Managed Care – HMO | Admitting: Family Medicine

## 2011-12-10 ENCOUNTER — Encounter: Payer: Self-pay | Admitting: Family Medicine

## 2011-12-10 VITALS — BP 112/81 | HR 90 | Temp 98.6°F | Ht 66.5 in | Wt 140.0 lb

## 2011-12-10 DIAGNOSIS — N915 Oligomenorrhea, unspecified: Secondary | ICD-10-CM

## 2011-12-10 DIAGNOSIS — E559 Vitamin D deficiency, unspecified: Secondary | ICD-10-CM

## 2011-12-10 DIAGNOSIS — Z01419 Encounter for gynecological examination (general) (routine) without abnormal findings: Secondary | ICD-10-CM

## 2011-12-10 DIAGNOSIS — D352 Benign neoplasm of pituitary gland: Secondary | ICD-10-CM

## 2011-12-10 DIAGNOSIS — B373 Candidiasis of vulva and vagina: Secondary | ICD-10-CM

## 2011-12-10 DIAGNOSIS — N760 Acute vaginitis: Secondary | ICD-10-CM

## 2011-12-10 DIAGNOSIS — N390 Urinary tract infection, site not specified: Secondary | ICD-10-CM

## 2011-12-10 DIAGNOSIS — R350 Frequency of micturition: Secondary | ICD-10-CM

## 2011-12-10 DIAGNOSIS — D353 Benign neoplasm of craniopharyngeal duct: Secondary | ICD-10-CM

## 2011-12-10 DIAGNOSIS — Z3009 Encounter for other general counseling and advice on contraception: Secondary | ICD-10-CM

## 2011-12-10 LAB — POCT WET PREP (WET MOUNT)
Clue Cells Wet Prep Whiff POC: NEGATIVE
WBC, Wet Prep HPF POC: >20

## 2011-12-10 LAB — POCT URINALYSIS DIPSTICK
Blood, UA: NEGATIVE
Ketones, UA: NEGATIVE
Protein, UA: NEGATIVE
Spec Grav, UA: 1.015
pH, UA: 7

## 2011-12-10 LAB — POCT UA - MICROSCOPIC ONLY

## 2011-12-10 MED ORDER — CEPHALEXIN 500 MG PO CAPS: 500.0000 mg | ORAL_CAPSULE | Freq: Two times a day (BID) | ORAL | Status: AC

## 2011-12-10 MED ORDER — FLUCONAZOLE 150 MG PO TABS: 150.0000 mg | ORAL_TABLET | Freq: Once | ORAL | Status: AC

## 2011-12-10 NOTE — Patient Instructions (Signed)
Take prenatal vitamins daily  I do not recommend taking any supplements or fertility enhancing drugs you can buy  Usually fertility evaluation does not start until 12 months of trying  If you continue to have irregular periods, please let me know and will refer you to a gynecologist.  Will let you know if you urine or vaginal swabs show signs of infection  Will check your prolactin, thyroid to make sure these aren't contributing to irregular periods

## 2011-12-10 NOTE — Progress Notes (Signed)
  Subjective:    Patient ID: Sarah Prince, female    DOB: 09/02/1986, 26 y.o.   MRN: [id number]  HPI Annual Gynecological Exam  G1p1 Wt Readings from Last 3 Encounters:  12/10/11 140 lb (63.504 kg)  10/30/11 137 lb (62.143 kg)  01/19/11 129 lb (58.514 kg)   Last period:  Feb 22 Regular periods:no- every other month.  Had implanon removed august, period start October, had menses in December and February.  Concerned with fertility- requests fertility medications Heavy bleeding: no  Sexually active: yes Birth control or hormonal therapy:non-trying to concevive Hx of STD: Patient desires STD screening Dyspareunia: No Hot flashes: No Vaginal discharge: no Dysuria:No   Last mammogram:no Breast mass or concerns: No Last Pap: Last year History of abnormal pap: No  FH of breast, uterine, ovarian, colon cancer: No  Wants to check for urine infection and yeast infection feels like there is a bad smell, has to urinate more frequently than usual.  No abdominal pain.  Not sure how long it has been going on.  Patient Information Form: Screening and ROS  AUDIT-C Score: 0 Do you feel safe in relationships? yes PHQ-2:negative  Review of Symptoms  General:  Negative for nexplained weight loss, fever Skin: Negative for new or changing mole, sore that won't heal HEENT: Negative for trouble hearing, trouble seeing, ringing in ears, mouth sores, hoarseness, change in voice, dysphagia. CV:  Negative for chest pain, dyspnea, edema, palpitations Resp: Negative for cough, dyspnea, hemoptysis GI: Negative for nausea, vomiting, diarrhea, constipation, abdominal pain, melena, hematochezia. GU: Negative for dysuria, incontinence, urinary hesitance, hematuria, vaginal or penile discharge, polyuria, sexual difficulty, lumps in testicle or breasts MSK: Negative for muscle cramps or aches, joint pain or swelling Neuro: Negative for headaches, weakness, numbness, dizziness, passing out/fainting Psych:  Negative for depression, anxiety, memory problems  Review of Systems See HPI    Objective:   Physical Exam GEN: Alert & Oriented, No acute distress Neck: supple CV:  Regular Rate & Rhythm, no murmur Respiratory:  Normal work of breathing, CTAB Abd:  + BS, soft, no tenderness to palpation Ext: no pre-tibial edema Pelvic Exam:        External: normal female genitalia without lesions or masses        Vagina: normal without lesions or masses        Cervix: normal without lesions or masses        Adnexa: normal bimanual exam without masses or fullness        Uterus: normal by palpation        Pap smear: performed        Samples for Wet prep, GC/Chlamydia obtained         Assessment & Plan:

## 2011-12-10 NOTE — Assessment & Plan Note (Signed)
Normal pap in March 2012.  Screening up to date

## 2011-12-10 NOTE — Assessment & Plan Note (Addendum)
Will assess prolactin (history of prolactinoma) and tsh to rule out these endocrine causes for oligomenorrhea.    Discussed with patient that I am not worried about fertility given < 4 months of unprotected regular intercourse since menses returned and  not long enough to establish if her cycles are regular.  Offered her gynecology consult for second opinion- she declines stating she just would like fertility meds to speed up the process.  Agreed to monitor menses and if continued to be irregular, asked her to let me know and would refer at that time for further workup for oligomenorrhea and discuss fertility

## 2011-12-10 NOTE — Assessment & Plan Note (Signed)
Diflucan

## 2011-12-10 NOTE — Assessment & Plan Note (Signed)
Recurrent uti, will treat with keflex and given diflucan for yeast vaginitis.

## 2011-12-14 ENCOUNTER — Telehealth: Payer: Self-pay | Admitting: Family Medicine

## 2011-12-14 ENCOUNTER — Encounter: Payer: Self-pay | Admitting: Family Medicine

## 2011-12-14 DIAGNOSIS — D352 Benign neoplasm of pituitary gland: Secondary | ICD-10-CM

## 2011-12-14 DIAGNOSIS — D353 Benign neoplasm of craniopharyngeal duct: Secondary | ICD-10-CM

## 2011-12-14 DIAGNOSIS — E559 Vitamin D deficiency, unspecified: Secondary | ICD-10-CM

## 2011-12-14 MED ORDER — VITAMIN D 1000 UNITS PO TABS: 1000.0000 [IU] | ORAL_TABLET | Freq: Every day | ORAL | Status: DC

## 2011-12-14 NOTE — Telephone Encounter (Signed)
Patient is returning Dr. Earnest Bailey about results.

## 2011-12-14 NOTE — Telephone Encounter (Signed)
Forward to Dr Earnest Bailey

## 2011-12-14 NOTE — Assessment & Plan Note (Addendum)
Vitamin d low again.  Given desiring pregnancy, will not treat with high dose, but instead 1000 units daily in addition to PNV

## 2011-12-14 NOTE — Assessment & Plan Note (Signed)
Patient would like to be referred to return to see endocrinologist.  will place order

## 2012-02-02 ENCOUNTER — Other Ambulatory Visit (HOSPITAL_COMMUNITY)
Admission: RE | Admit: 2012-02-02 | Discharge: 2012-02-02 | Disposition: A | Discharge status: Home / Self Care | Payer: BC Managed Care – HMO | Source: Ambulatory Visit | Attending: Family Medicine | Admitting: Family Medicine

## 2012-02-02 ENCOUNTER — Encounter: Payer: Self-pay | Admitting: Family Medicine

## 2012-02-02 ENCOUNTER — Ambulatory Visit (INDEPENDENT_AMBULATORY_CARE_PROVIDER_SITE_OTHER): Payer: BC Managed Care – HMO | Admitting: Family Medicine

## 2012-02-02 VITALS — BP 100/72 | HR 98 | Temp 98.6°F | Ht 66.0 in | Wt 141.0 lb

## 2012-02-02 DIAGNOSIS — Z331 Pregnant state, incidental: Secondary | ICD-10-CM

## 2012-02-02 DIAGNOSIS — Z3009 Encounter for other general counseling and advice on contraception: Secondary | ICD-10-CM

## 2012-02-02 DIAGNOSIS — N760 Acute vaginitis: Secondary | ICD-10-CM

## 2012-02-02 DIAGNOSIS — E559 Vitamin D deficiency, unspecified: Secondary | ICD-10-CM

## 2012-02-02 DIAGNOSIS — B3731 Acute candidiasis of vulva and vagina: Secondary | ICD-10-CM

## 2012-02-02 DIAGNOSIS — B373 Candidiasis of vulva and vagina: Secondary | ICD-10-CM

## 2012-02-02 DIAGNOSIS — Z113 Encounter for screening for infections with a predominantly sexual mode of transmission: Secondary | ICD-10-CM | POA: Insufficient documentation

## 2012-02-02 DIAGNOSIS — N390 Urinary tract infection, site not specified: Secondary | ICD-10-CM

## 2012-02-02 LAB — POCT WET PREP (WET MOUNT)

## 2012-02-02 LAB — POCT URINALYSIS DIPSTICK
Bilirubin, UA: NEGATIVE
Blood, UA: NEGATIVE
Ketones, UA: NEGATIVE
pH, UA: 6.5

## 2012-02-02 LAB — POCT UA - MICROSCOPIC ONLY: Epithelial cells, urine per micros: >20

## 2012-02-02 LAB — POCT URINE PREGNANCY: Preg Test, Ur: POSITIVE

## 2012-02-02 MED ORDER — FLUCONAZOLE 150 MG PO TABS: 150.0000 mg | ORAL_TABLET | Freq: Once | ORAL | Status: AC

## 2012-02-02 NOTE — Progress Notes (Signed)
  Subjective:    Patient ID: Sarah Prince, female    DOB: 1985-11-09, 26 y.o.   MRN: [id number]  HPIwork in appt  Urine dark yellow for past several days.  No dysuria.  Vaginal discharge, white, some odor.  No abdominal pain , fevers.  Last antibiotic in March for UTI and vaginal discharge.  Married, no outside sexual partners.  No itching or pain.  Fertility:  Taking ovulation tests- first month doing this- positive ovulation test on 14-15th of this month.  Last period started march 29 ended April 1st.  Review of Systems     Objective:   Physical Exam GEN: NAD Pelvic Exam:        External: normal female genitalia without lesions or masses        Vagina: normal without lesions or masses        Cervix: normal without lesions or masses        Adnexa: normal bimanual exam without masses or fullness        Uterus: normal by palpation        Samples for Wet prep, GC/Chlamydia obtained        Assessment & Plan:

## 2012-02-02 NOTE — Patient Instructions (Signed)
Have sex every other day and every day the week before your ovulation.  We have called your endocrinologists office  I will call you with results of your urine and vaginal tests

## 2012-02-02 NOTE — Assessment & Plan Note (Signed)
We discussed proper use of ovulation testing , stressing importance of regular intercourse the week before anticipated ovulation and not waiting until after she gets a positive test.  Reassured her that she is ovulating, has been trying sporadically for less than 6 months in the setting of getting her Implanon out 6 months ago.  Will continue to follow

## 2012-02-02 NOTE — Assessment & Plan Note (Signed)
Tolerating daily vitamin d, will continue to supplement for longer before rechecking.

## 2012-02-02 NOTE — Assessment & Plan Note (Addendum)
Called patient back- urine pregnancy test is positive!  She will continue to take prenatal vitamins, plans on doing prenatal care with our practice.  LMP March 29  Will schedule that appointment for her.    Endocrinology appts also pending for follow-up of prolactinoma- not actively under treatment

## 2012-02-22 ENCOUNTER — Telehealth: Payer: Self-pay | Admitting: Family Medicine

## 2012-02-22 NOTE — Telephone Encounter (Signed)
Needs a proof of pregnancy and estimated due date for her to take to [facility] call when ready - leave VM if she doesn't answer

## 2012-02-22 NOTE — Telephone Encounter (Signed)
Left message on voicemail informing patient that letter was ready to be picked up.

## 2012-02-25 ENCOUNTER — Other Ambulatory Visit: Payer: BC Managed Care – HMO

## 2012-02-25 DIAGNOSIS — Z331 Pregnant state, incidental: Secondary | ICD-10-CM

## 2012-02-25 NOTE — Progress Notes (Signed)
PRENATAL LABS DONE TODAY Sarah Prince 

## 2012-02-26 LAB — OBSTETRIC PANEL
Eosinophils Relative: 2 % (ref 0–5)
Hepatitis B Surface Ag: NEGATIVE
Lymphocytes Relative: 23 % (ref 12–46)
Lymphs Abs: 1.7 10*3/uL (ref 0.7–4.0)
MCV: 78.7 fL (ref 78.0–100.0)
Neutro Abs: 4.7 10*3/uL (ref 1.7–7.7)
Platelets: 258 10*3/uL (ref 150–400)
RBC: 4.27 MIL/uL (ref 3.87–5.11)
Rubella: 198.9 IU/mL — ABNORMAL HIGH
WBC: 7.2 10*3/uL (ref 4.0–10.5)

## 2012-02-26 LAB — SICKLE CELL SCREEN: Sickle Cell Screen: POSITIVE — AB

## 2012-02-27 LAB — CULTURE, OB URINE
Colony Count: NO GROWTH
Organism ID, Bacteria: NO GROWTH

## 2012-03-03 ENCOUNTER — Ambulatory Visit (INDEPENDENT_AMBULATORY_CARE_PROVIDER_SITE_OTHER): Payer: BC Managed Care – HMO | Admitting: Family Medicine

## 2012-03-03 ENCOUNTER — Other Ambulatory Visit: Payer: Self-pay | Admitting: Family Medicine

## 2012-03-03 ENCOUNTER — Other Ambulatory Visit (HOSPITAL_COMMUNITY)
Admission: RE | Admit: 2012-03-03 | Discharge: 2012-03-03 | Disposition: A | Discharge status: Home / Self Care | Payer: BC Managed Care – HMO | Source: Ambulatory Visit | Attending: Family Medicine | Admitting: Family Medicine

## 2012-03-03 ENCOUNTER — Encounter: Payer: Self-pay | Admitting: Family Medicine

## 2012-03-03 VITALS — BP 128/78 | Wt 144.0 lb

## 2012-03-03 DIAGNOSIS — Z3480 Encounter for supervision of other normal pregnancy, unspecified trimester: Secondary | ICD-10-CM

## 2012-03-03 DIAGNOSIS — Z3682 Encounter for antenatal screening for nuchal translucency: Secondary | ICD-10-CM

## 2012-03-03 DIAGNOSIS — Z01419 Encounter for gynecological examination (general) (routine) without abnormal findings: Secondary | ICD-10-CM | POA: Insufficient documentation

## 2012-03-03 DIAGNOSIS — Z3490 Encounter for supervision of normal pregnancy, unspecified, unspecified trimester: Secondary | ICD-10-CM

## 2012-03-03 DIAGNOSIS — Z113 Encounter for screening for infections with a predominantly sexual mode of transmission: Secondary | ICD-10-CM | POA: Insufficient documentation

## 2012-03-03 LAB — POCT WET PREP (WET MOUNT)

## 2012-03-03 MED ORDER — MICONAZOLE NITRATE 200 MG VA SUPP: 200.0000 mg | Freq: Every day | VAGINAL | Status: AC

## 2012-03-03 NOTE — Patient Instructions (Signed)
It was good to see you today!  I would encourage you to take a vitamin. You can take 2 children's vitamins if you do not like the prenatal. Also, you can take Folic Acid alone if you had rather. I have put in a referral for you to have the integrated genetic screening test. This will be in  3 weeks. Please return to clinic to see me in 4 weeks. I have also sent in a prescription to the pharmacy for a cream that you can use if/when you want.  Everything looks great today!  We will see you in 4 weeks- at that time, we will get a Varicella (chicken pox) titer and have you fill out paperwork.  Take care! Marcine Gadway M. Alee Katen, M.D.

## 2012-03-04 DIAGNOSIS — O0990 Supervision of high risk pregnancy, unspecified, unspecified trimester: Secondary | ICD-10-CM | POA: Insufficient documentation

## 2012-03-04 NOTE — Progress Notes (Signed)
Patient is a 26 yo G2P0101 at [redacted]w[redacted]d by LMP establishing prenatal care  This is a wanted pregnancy. She had Implanon removed last March and has been trying to conceive since then. She is hoping for twins. She overall feels well other than feeling hungry all the time. No N/V. No abd pain. No bleeding. She does have a recurrent yeast infection, currently with symptoms. She has one child at home, born in 2009.   PMH: Prolactinoma- Followed by [facility]. Also, sickle cell screen positive. OB Hx: Preterm ROM at 33 weeks with last pregnancy. Delivered 5 days later at [facility]. No history of GDM or GHTN PSH: None Family Hx: Mother-DM. No cancer in family. Son with sickle cell trait Social Hx: Married since 2008, feels safe at home. 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 Hopes to get Master's degree. 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 She does not smoke, no EtOH, no drug use.   PE:  Gen: Well appearing, no acute distress HEENT: Good dentition. No LAD Pulm: CTAB Heart: RRR Abd: Soft, nontender. Uterus with IUP noted on bedside ultrasound. GU: Vagina wnl, no lesions. Cervix with thick white discharge. Bleeding noted after pap. Bimanual exam with uterus the size of a softball. Extremities: No edema Neuro: Grossly intact  A/P: 26 yo G2P0101 at [redacted]w[redacted]d initial OB - Prenatal labs completed and reviewed. Sickle cell trait + - Unsure of Varicella status. Will draw titers at next visit - Not taking prenatal vitamins. Encouraged her to take children's vitamins or Folic acid alone - Yeast infection, difficult to treat. Hesitant to use cream due to +HPT in December then subsequently having period when starting cream. Will prescribe Miconazole cream to use if/when she would like to. - Will follow up with Endocrine for Prolactinoma - Patient is interested in Integrated screen. Will schedule for 3 weeks from today (after 11 weeks of  pregnancy) - Patient has history of early ROM. Will see her more frequently towards the end of pregnancy and give PTL precautions. - Will need Tdap at 28 weeks, as well as Flu shot after October - She will return to clinic in 4 weeks for next visit. Will refer to [facility] clinic in 2nd trimester.

## 2012-03-09 ENCOUNTER — Encounter: Payer: Self-pay | Admitting: Family Medicine

## 2012-03-21 ENCOUNTER — Encounter: Payer: Self-pay | Admitting: Family Medicine

## 2012-03-21 NOTE — Progress Notes (Signed)
Note reviewed.  Pt noted to have following issues: H/o preterm delivery at 65 and 1/7.  Pt eligible for 17P.  Needs transfer to high risk OB for PTD prior to 34 weeks. + FH of diabetes in 1st degree relative - needs early glucola. +Sickle cell trait -should offer genetics counseling and paternal testing, if desired.   Prolactinoma - followed by endocrine.  Varicella unknown - can draw titers when pt comes in for early glucola.

## 2012-03-24 ENCOUNTER — Ambulatory Visit (INDEPENDENT_AMBULATORY_CARE_PROVIDER_SITE_OTHER): Payer: BC Managed Care – HMO | Admitting: Family Medicine

## 2012-03-24 ENCOUNTER — Ambulatory Visit (HOSPITAL_COMMUNITY)
Admission: RE | Admit: 2012-03-24 | Discharge: 2012-03-24 | Disposition: A | Discharge status: Home / Self Care | Payer: BC Managed Care – HMO | Source: Ambulatory Visit | Attending: Family Medicine | Admitting: Family Medicine

## 2012-03-24 ENCOUNTER — Encounter (HOSPITAL_COMMUNITY): Payer: Self-pay

## 2012-03-24 VITALS — BP 115/79 | Wt 143.6 lb

## 2012-03-24 DIAGNOSIS — O3510X0 Maternal care for (suspected) chromosomal abnormality in fetus, unspecified, not applicable or unspecified: Secondary | ICD-10-CM | POA: Insufficient documentation

## 2012-03-24 DIAGNOSIS — O351XX0 Maternal care for (suspected) chromosomal abnormality in fetus, not applicable or unspecified: Secondary | ICD-10-CM | POA: Insufficient documentation

## 2012-03-24 DIAGNOSIS — Z8751 Personal history of pre-term labor: Secondary | ICD-10-CM | POA: Insufficient documentation

## 2012-03-24 DIAGNOSIS — Z3689 Encounter for other specified antenatal screening: Secondary | ICD-10-CM | POA: Insufficient documentation

## 2012-03-24 DIAGNOSIS — Z3490 Encounter for supervision of normal pregnancy, unspecified, unspecified trimester: Secondary | ICD-10-CM

## 2012-03-24 DIAGNOSIS — Z3682 Encounter for antenatal screening for nuchal translucency: Secondary | ICD-10-CM

## 2012-03-24 DIAGNOSIS — Z3480 Encounter for supervision of other normal pregnancy, unspecified trimester: Secondary | ICD-10-CM

## 2012-03-24 NOTE — Progress Notes (Signed)
26 yo G2P0101 at [redacted]w[redacted]d Patient doing well. Had her integrated screen at [facility] today. She states she was happy to see the baby (not twins!) She is not having and nausea, pain, pressure, vaginal bleeding or discharge. She continues to have some vaginal irritation but has not used any cream for it.  - She was informed that she needs to be seen at high risk clinic due to PTL and prolactinoma. Patient states she had rather receive care here. Will make referral to High Risk clinic and let patient know at [phone] - She has limited time for appointment today therefore early GTT was not performed. Will need to be done at next visit. She will need varicella titer that day as well - Will need Anatomy screen at 19 weeks, Tdap at 28 weeks. - Instructed to be seen at [facility] for any pain, pressure or vaginal bleeding. - Will need follow up at High Risk clinic in 4 weeks

## 2012-03-24 NOTE — Patient Instructions (Signed)
It was good to see you today. I'm glad things are going well!  I will call you with the appointment at [facility]. If you need anything, let me know!  Offie Waide M. Janssen Zee, M.D.

## 2012-03-25 ENCOUNTER — Other Ambulatory Visit (HOSPITAL_COMMUNITY): Payer: BC Managed Care – HMO

## 2012-03-29 ENCOUNTER — Other Ambulatory Visit: Payer: Self-pay

## 2012-04-18 ENCOUNTER — Encounter: Payer: Self-pay | Admitting: Obstetrics & Gynecology

## 2012-04-18 ENCOUNTER — Ambulatory Visit (INDEPENDENT_AMBULATORY_CARE_PROVIDER_SITE_OTHER): Payer: BC Managed Care – HMO | Admitting: Obstetrics & Gynecology

## 2012-04-18 VITALS — BP 114/66 | Temp 98.5°F | Ht 65.75 in | Wt 144.1 lb

## 2012-04-18 DIAGNOSIS — Z331 Pregnant state, incidental: Secondary | ICD-10-CM

## 2012-04-18 DIAGNOSIS — D353 Benign neoplasm of craniopharyngeal duct: Secondary | ICD-10-CM

## 2012-04-18 DIAGNOSIS — B373 Candidiasis of vulva and vagina: Secondary | ICD-10-CM

## 2012-04-18 DIAGNOSIS — Z3490 Encounter for supervision of normal pregnancy, unspecified, unspecified trimester: Secondary | ICD-10-CM

## 2012-04-18 DIAGNOSIS — N915 Oligomenorrhea, unspecified: Secondary | ICD-10-CM

## 2012-04-18 LAB — POCT URINALYSIS DIP (DEVICE)
Bilirubin Urine: NEGATIVE
Glucose, UA: NEGATIVE mg/dL
Hgb urine dipstick: NEGATIVE
Ketones, ur: NEGATIVE mg/dL
Nitrite: NEGATIVE
Specific Gravity, Urine: 1.02 (ref 1.005–1.030)

## 2012-04-18 MED ORDER — HYDROXYPROGESTERONE CAPROATE 250 MG/ML IM OIL
250.0000 mg | TOPICAL_OIL | INTRAMUSCULAR | Status: DC
  Administered 2012-04-25 – 2012-06-07 (×7): 250 mg via INTRAMUSCULAR

## 2012-04-18 NOTE — Progress Notes (Signed)
U/S scheduled with MFM May 09, 2012 at 11 am.

## 2012-04-18 NOTE — Progress Notes (Signed)
Nutrition Note: (1st visit consult) Pt dx with hx of PTD (33w) and LBW.  Current wt gain of 9# is within normal limits @ [redacted]w[redacted]d gestation.  Pt reports good intake of 6 small, frequent meals daily.  Is lactose intolerant.  Pt does not eat a wide variety of foods, limited to mostly carbs and proteins.  Pt rarely eats fruits, vegetables or gets dairy products.  Disc importance of vitamins and minerals from all food groups.  Pt agrees to take PNV daily or at least qod.  Pt also agrees to increase intake of dairy to at least 1 serving daily. Pt does receive WIC services and plans to breastfeed.  Follow up if referred. Cy Blamer, RD

## 2012-04-18 NOTE — Addendum Note (Signed)
Addended by: Doreen Salvage on: 04/18/2012 11:34 AM   Modules accepted: Orders

## 2012-04-18 NOTE — Progress Notes (Signed)
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  Pt has no visual symptoms or headaches currently.  Pt will need serial visual field checks and rpt MRI if has symptoms.  Pt has history of PTD at 33 weeks.  Pt agrees to 17-P.  Pt knows she is sickle cell trait and FOB can be tested.  Pt will decide at next visit.  AFP only today.  Anatomy US ordered.

## 2012-04-18 NOTE — Patient Instructions (Addendum)
Breastfeeding BENEFITS OF BREASTFEEDING For the baby  The first milk (colostrum) helps the baby's digestive system function better.   There are antibodies from the mother in the milk that help the baby fight off infections.   The baby has a lower incidence of asthma, allergies, and SIDS (sudden infant death syndrome).   The nutrients in breast milk are better than formulas for the baby and helps the baby's brain grow better.   Babies who breastfeed have less gas, colic, and constipation.  For the mother  Breastfeeding helps develop a very special bond between mother and baby.   It is more convenient, always available at the correct temperature and cheaper than formula feeding.   It burns calories in the mother and helps with losing weight that was gained during pregnancy.   It makes the uterus contract back down to normal size faster and slows bleeding following delivery.   Breastfeeding mothers have a lower risk of developing breast cancer.  NURSE FREQUENTLY  A healthy, full-term baby may breastfeed as often as every hour or space his or her feedings to every 3 hours.   How often to nurse will vary from baby to baby. Watch your baby for signs of hunger, not the clock.   Nurse as often as the baby requests, or when you feel the need to reduce the fullness of your breasts.   Awaken the baby if it has been 3 to 4 hours since the last feeding.   Frequent feeding will help the mother make more milk and will prevent problems like sore nipples and engorgement of the breasts.  BABY'S POSITION AT THE BREAST  Whether lying down or sitting, be sure that the baby's tummy is facing your tummy.   Support the breast with 4 fingers underneath the breast and the thumb above. Make sure your fingers are well away from the nipple and baby's mouth.   Stroke the baby's lips and cheek closest to the breast gently with your finger or nipple.   When the baby's mouth is open wide enough, place all  of your nipple and as much of the dark area around the nipple as possible into your baby's mouth.   Pull the baby in close so the tip of the nose and the baby's cheeks touch the breast during the feeding.  FEEDINGS  The length of each feeding varies from baby to baby and from feeding to feeding.   The baby must suck about 2 to 3 minutes for your milk to get to him or her. This is called a "let down." For this reason, allow the baby to feed on each breast as long as he or she wants. Your baby will end the feeding when he or she has received the right balance of nutrients.   To break the suction, put your finger into the corner of the baby's mouth and slide it between his or her gums before removing your breast from his or her mouth. This will help prevent sore nipples.  REDUCING BREAST ENGORGEMENT  In the first week after your baby is born, you may experience signs of breast engorgement. When breasts are engorged, they feel heavy, warm, full, and may be tender to the touch. You can reduce engorgement if you:   Nurse frequently, every 2 to 3 hours. Mothers who breastfeed early and often have fewer problems with engorgement.   Place light ice packs on your breasts between feedings. This reduces swelling. Wrap the ice packs in a   lightweight towel to protect your skin.   Apply moist hot packs to your breast for 5 to 10 minutes before each feeding. This increases circulation and helps the milk flow.   Gently massage your breast before and during the feeding.   Make sure that the baby empties at least one breast at every feeding before switching sides.   Use a breast pump to empty the breasts if your baby is sleepy or not nursing well. You may also want to pump if you are returning to work or or you feel you are getting engorged.   Avoid bottle feeds, pacifiers or supplemental feedings of water or juice in place of breastfeeding.   Be sure the baby is latched on and positioned properly while  breastfeeding.   Prevent fatigue, stress, and anemia.   Wear a supportive bra, avoiding underwire styles.   Eat a balanced diet with enough fluids.  If you follow these suggestions, your engorgement should improve in 24 to 48 hours. If you are still experiencing difficulty, call your lactation consultant or caregiver. IS MY BABY GETTING ENOUGH MILK? Sometimes, mothers worry about whether their babies are getting enough milk. You can be assured that your baby is getting enough milk if:  The baby is actively sucking and you hear swallowing.   The baby nurses at least 8 to 12 times in a 24 hour time period. Nurse your baby until he or she unlatches or falls asleep at the first breast (at least 10 to 20 minutes), then offer the second side.   The baby is wetting 5 to 6 disposable diapers (6 to 8 cloth diapers) in a 24 hour period by 5 to 6 days of age.   The baby is having at least 2 to 3 stools every 24 hours for the first few months. Breast milk is all the food your baby needs. It is not necessary for your baby to have water or formula. In fact, to help your breasts make more milk, it is best not to give your baby supplemental feedings during the early weeks.   The stool should be soft and yellow.   The baby should gain 4 to 7 ounces per week after he is 4 days old.  TAKE CARE OF YOURSELF Take care of your breasts by:  Bathing or showering daily.   Avoiding the use of soaps on your nipples.   Start feedings on your left breast at one feeding and on your right breast at the next feeding.   You will notice an increase in your milk supply 2 to 5 days after delivery. You may feel some discomfort from engorgement, which makes your breasts very firm and often tender. Engorgement "peaks" out within 24 to 48 hours. In the meantime, apply warm moist towels to your breasts for 5 to 10 minutes before feeding. Gentle massage and expression of some milk before feeding will soften your breasts, making  it easier for your baby to latch on. Wear a well fitting nursing bra and air dry your nipples for 10 to 15 minutes after each feeding.   Only use cotton bra pads.   Only use pure lanolin on your nipples after nursing. You do not need to wash it off before nursing.  Take care of yourself by:   Eating well-balanced meals and nutritious snacks.   Drinking milk, fruit juice, and water to satisfy your thirst (about 8 glasses a day).   Getting plenty of rest.   Increasing calcium in   your diet (1200 mg a day).   Avoiding foods that you notice affect the baby in a bad way.  SEEK MEDICAL CARE IF:   You have any questions or difficulty with breastfeeding.   You need help.   You have a hard, red, sore area on your breast, accompanied by a fever of 100.5 F (38.1 C) or more.   Your baby is too sleepy to eat well or is having trouble sleeping.   Your baby is wetting less than 6 diapers per day, by 5 days of age.   Your baby's skin or white part of his or her eyes is more yellow than it was in the hospital.   You feel depressed.  Document Released: 09/21/2005 Document Revised: 09/10/2011 Document Reviewed: 05/06/2009 Exit[employer] Patient Information 2012 [employer]. 

## 2012-04-18 NOTE — Progress Notes (Signed)
Pulse 96 1 hr gtt @ 1103 varicella titer as well

## 2012-04-19 LAB — VARICELLA ZOSTER ANTIBODY, IGG: Varicella IgG: 3.22 {ISR} — ABNORMAL HIGH

## 2012-04-20 ENCOUNTER — Encounter: Payer: Self-pay | Admitting: *Deleted

## 2012-04-25 ENCOUNTER — Ambulatory Visit (INDEPENDENT_AMBULATORY_CARE_PROVIDER_SITE_OTHER): Payer: BC Managed Care – HMO | Admitting: *Deleted

## 2012-04-25 VITALS — BP 105/73 | HR 86 | Resp 12 | Wt 144.2 lb

## 2012-04-25 DIAGNOSIS — Z3490 Encounter for supervision of normal pregnancy, unspecified, unspecified trimester: Secondary | ICD-10-CM

## 2012-04-25 DIAGNOSIS — O09219 Supervision of pregnancy with history of pre-term labor, unspecified trimester: Secondary | ICD-10-CM

## 2012-04-25 LAB — POCT URINALYSIS DIP (DEVICE)
Glucose, UA: NEGATIVE mg/dL
Ketones, ur: NEGATIVE mg/dL
Specific Gravity, Urine: 1.025 (ref 1.005–1.030)
Urobilinogen, UA: 0.2 mg/dL (ref 0.0–1.0)

## 2012-04-28 ENCOUNTER — Encounter: Payer: Self-pay | Admitting: Obstetrics & Gynecology

## 2012-05-02 ENCOUNTER — Ambulatory Visit (INDEPENDENT_AMBULATORY_CARE_PROVIDER_SITE_OTHER): Payer: BC Managed Care – HMO

## 2012-05-02 VITALS — BP 122/64 | HR 84 | Wt 143.9 lb

## 2012-05-02 DIAGNOSIS — O09899 Supervision of other high risk pregnancies, unspecified trimester: Secondary | ICD-10-CM

## 2012-05-02 DIAGNOSIS — O09219 Supervision of pregnancy with history of pre-term labor, unspecified trimester: Secondary | ICD-10-CM

## 2012-05-02 DIAGNOSIS — D352 Benign neoplasm of pituitary gland: Secondary | ICD-10-CM

## 2012-05-09 ENCOUNTER — Ambulatory Visit (HOSPITAL_COMMUNITY): Payer: BC Managed Care – HMO

## 2012-05-09 ENCOUNTER — Ambulatory Visit (INDEPENDENT_AMBULATORY_CARE_PROVIDER_SITE_OTHER): Payer: BC Managed Care – HMO | Admitting: *Deleted

## 2012-05-09 ENCOUNTER — Encounter: Payer: Self-pay | Admitting: Obstetrics & Gynecology

## 2012-05-09 ENCOUNTER — Ambulatory Visit (HOSPITAL_COMMUNITY)
Admission: RE | Admit: 2012-05-09 | Discharge: 2012-05-09 | Disposition: A | Discharge status: Home / Self Care | Payer: BC Managed Care – HMO | Source: Ambulatory Visit | Attending: Obstetrics & Gynecology | Admitting: Obstetrics & Gynecology

## 2012-05-09 VITALS — BP 121/69 | HR 95 | Wt 145.5 lb

## 2012-05-09 VITALS — BP 124/72 | HR 98 | Temp 97.7°F | Ht 66.0 in | Wt 144.7 lb

## 2012-05-09 DIAGNOSIS — O09219 Supervision of pregnancy with history of pre-term labor, unspecified trimester: Secondary | ICD-10-CM

## 2012-05-09 DIAGNOSIS — Z3490 Encounter for supervision of normal pregnancy, unspecified, unspecified trimester: Secondary | ICD-10-CM

## 2012-05-09 DIAGNOSIS — O358XX0 Maternal care for other (suspected) fetal abnormality and damage, not applicable or unspecified: Secondary | ICD-10-CM | POA: Insufficient documentation

## 2012-05-09 DIAGNOSIS — Z8751 Personal history of pre-term labor: Secondary | ICD-10-CM

## 2012-05-09 DIAGNOSIS — Z363 Encounter for antenatal screening for malformations: Secondary | ICD-10-CM | POA: Insufficient documentation

## 2012-05-09 DIAGNOSIS — Z1389 Encounter for screening for other disorder: Secondary | ICD-10-CM | POA: Insufficient documentation

## 2012-05-09 DIAGNOSIS — D352 Benign neoplasm of pituitary gland: Secondary | ICD-10-CM

## 2012-05-09 NOTE — Progress Notes (Signed)
Patient seen today  for ultrasound.  See full report in [facility].  Alpha Gula, MD  Single IUP at 18 3/7 weeks Normal detailed anatomy survey No markers associated with aneuploidy noted Normal amniotic fluid volume  Recommend follow up ultrasounds as clinically indicated

## 2012-05-16 ENCOUNTER — Encounter: Payer: Self-pay | Admitting: *Deleted

## 2012-05-16 ENCOUNTER — Ambulatory Visit (INDEPENDENT_AMBULATORY_CARE_PROVIDER_SITE_OTHER): Payer: BC Managed Care – HMO | Admitting: Obstetrics & Gynecology

## 2012-05-16 VITALS — BP 121/71 | Temp 97.6°F | Wt 145.5 lb

## 2012-05-16 DIAGNOSIS — O09219 Supervision of pregnancy with history of pre-term labor, unspecified trimester: Secondary | ICD-10-CM

## 2012-05-16 DIAGNOSIS — O09899 Supervision of other high risk pregnancies, unspecified trimester: Secondary | ICD-10-CM

## 2012-05-16 LAB — POCT URINALYSIS DIP (DEVICE)
Bilirubin Urine: NEGATIVE
Ketones, ur: NEGATIVE mg/dL
Protein, ur: NEGATIVE mg/dL
Specific Gravity, Urine: 1.025 (ref 1.005–1.030)
pH: 6 (ref 5.0–8.0)

## 2012-05-16 NOTE — Progress Notes (Signed)
17 P. Has vaginal d/c and itch, no odor, used yeast prep 2 weeks ago and sx returned. Slight yellow d/c noted, wet prep done

## 2012-05-16 NOTE — Patient Instructions (Signed)

## 2012-05-16 NOTE — Progress Notes (Signed)
Pt c/o "hands and feet freeze up" and possible yeast infection.  Pain-back Pulse- 97

## 2012-05-17 LAB — WET PREP, GENITAL: Clue Cells Wet Prep HPF POC: NONE SEEN

## 2012-05-23 ENCOUNTER — Encounter: Payer: Self-pay | Admitting: Medical

## 2012-05-23 ENCOUNTER — Ambulatory Visit (INDEPENDENT_AMBULATORY_CARE_PROVIDER_SITE_OTHER): Payer: BC Managed Care – HMO | Admitting: Medical

## 2012-05-23 ENCOUNTER — Other Ambulatory Visit: Payer: Self-pay | Admitting: Medical

## 2012-05-23 VITALS — BP 111/75 | HR 90 | Resp 12

## 2012-05-23 DIAGNOSIS — B373 Candidiasis of vulva and vagina: Secondary | ICD-10-CM

## 2012-05-23 DIAGNOSIS — O09219 Supervision of pregnancy with history of pre-term labor, unspecified trimester: Secondary | ICD-10-CM

## 2012-05-23 MED ORDER — FLUCONAZOLE 150 MG PO TABS: 150.0000 mg | ORAL_TABLET | Freq: Once | ORAL | Status: AC

## 2012-05-30 ENCOUNTER — Ambulatory Visit (INDEPENDENT_AMBULATORY_CARE_PROVIDER_SITE_OTHER): Payer: BC Managed Care – HMO

## 2012-05-30 VITALS — BP 122/72 | HR 95 | Wt 146.2 lb

## 2012-05-30 DIAGNOSIS — O09219 Supervision of pregnancy with history of pre-term labor, unspecified trimester: Secondary | ICD-10-CM

## 2012-06-07 ENCOUNTER — Ambulatory Visit (INDEPENDENT_AMBULATORY_CARE_PROVIDER_SITE_OTHER): Payer: BC Managed Care – HMO

## 2012-06-07 VITALS — BP 128/78 | HR 101 | Wt 148.8 lb

## 2012-06-07 DIAGNOSIS — O09219 Supervision of pregnancy with history of pre-term labor, unspecified trimester: Secondary | ICD-10-CM

## 2012-06-13 ENCOUNTER — Encounter: Payer: BC Managed Care – HMO | Admitting: Family Medicine

## 2012-06-15 ENCOUNTER — Ambulatory Visit (INDEPENDENT_AMBULATORY_CARE_PROVIDER_SITE_OTHER): Payer: BC Managed Care – HMO | Admitting: *Deleted

## 2012-06-15 VITALS — BP 127/79 | HR 96 | Temp 98.2°F | Ht 66.0 in | Wt 150.2 lb

## 2012-06-15 DIAGNOSIS — O09219 Supervision of pregnancy with history of pre-term labor, unspecified trimester: Secondary | ICD-10-CM

## 2012-06-15 MED ORDER — HYDROXYPROGESTERONE CAPROATE 250 MG/ML IM OIL
250.0000 mg | TOPICAL_OIL | INTRAMUSCULAR | Status: DC
  Administered 2012-06-15: 250 mg via INTRAMUSCULAR

## 2012-06-15 NOTE — Addendum Note (Signed)
Addended by: Sherre Lain A on: 06/15/2012 08:06 AM   Modules accepted: Level of Service

## 2012-06-20 ENCOUNTER — Encounter: Payer: Self-pay | Admitting: Obstetrics & Gynecology

## 2012-06-20 ENCOUNTER — Ambulatory Visit (INDEPENDENT_AMBULATORY_CARE_PROVIDER_SITE_OTHER): Payer: BC Managed Care – HMO | Admitting: Obstetrics & Gynecology

## 2012-06-20 VITALS — BP 109/69 | Temp 99.3°F | Wt 153.4 lb

## 2012-06-20 DIAGNOSIS — O0990 Supervision of high risk pregnancy, unspecified, unspecified trimester: Secondary | ICD-10-CM

## 2012-06-20 DIAGNOSIS — O09219 Supervision of pregnancy with history of pre-term labor, unspecified trimester: Secondary | ICD-10-CM

## 2012-06-20 LAB — POCT URINALYSIS DIP (DEVICE)
Glucose, UA: NEGATIVE mg/dL
Ketones, ur: NEGATIVE mg/dL
Protein, ur: NEGATIVE mg/dL
Specific Gravity, Urine: 1.02 (ref 1.005–1.030)

## 2012-06-20 MED ORDER — TERCONAZOLE 0.4 % VA CREA: 1.0000 | TOPICAL_CREAM | Freq: Every day | VAGINAL | Status: AC

## 2012-06-20 MED ORDER — HYDROXYPROGESTERONE CAPROATE 250 MG/ML IM OIL
250.0000 mg | TOPICAL_OIL | INTRAMUSCULAR | Status: DC
  Administered 2012-06-20: 250 mg via INTRAMUSCULAR

## 2012-06-20 NOTE — Progress Notes (Signed)
Gets blurred vision on occasion. Was prescribed vision correction years ago but using none now. Recommend vision exam at optometrist before next office visit. Continue 17 P weekly.

## 2012-06-20 NOTE — Progress Notes (Signed)
P = 104 C/o increased white discharge, LBP and occasional blurred vision.

## 2012-06-20 NOTE — Progress Notes (Signed)
Still with white discharge and itching despite treatment for yeast last visit with diflucan. Will Rx Terazol cream for 7 days.

## 2012-06-20 NOTE — Addendum Note (Signed)
Addended by: Sherre Lain A on: 06/20/2012 09:15 AM   Modules accepted: Orders

## 2012-06-20 NOTE — Patient Instructions (Signed)
Monilial Vaginitis Vaginitis in a soreness, swelling and redness (inflammation) of the vagina and vulva. Monilial vaginitis is not a sexually transmitted infection. CAUSES  Yeast vaginitis is caused by yeast (candida) that is normally found in your vagina. With a yeast infection, the candida has overgrown in number to a point that upsets the chemical balance. SYMPTOMS   White, thick vaginal discharge.   Swelling, itching, redness and irritation of the vagina and possibly the lips of the vagina (vulva).   Burning or painful urination.   Painful intercourse.  DIAGNOSIS  Things that may contribute to monilial vaginitis are:  Postmenopausal and virginal states.   Pregnancy.   Infections.   Being tired, sick or stressed, especially if you had monilial vaginitis in the past.   Diabetes. Good control will help lower the chance.   Birth control pills.   Tight fitting garments.   Using bubble bath, feminine sprays, douches or deodorant tampons.   Taking certain medications that kill germs (antibiotics).   Sporadic recurrence can occur if you become ill.  TREATMENT  Your caregiver will give you medication.  There are several kinds of anti monilial vaginal creams and suppositories specific for monilial vaginitis. For recurrent yeast infections, use a suppository or cream in the vagina 2 times a week, or as directed.   Anti-monilial or steroid cream for the itching or irritation of the vulva may also be used. Get your caregiver's permission.   Painting the vagina with methylene blue solution may help if the monilial cream does not work.   Eating yogurt may help prevent monilial vaginitis.  HOME CARE INSTRUCTIONS   Finish all medication as prescribed.   Do not have sex until treatment is completed or after your caregiver tells you it is okay.   Take warm sitz baths.   Do not douche.   Do not use tampons, especially scented ones.   Wear cotton underwear.   Avoid tight  pants and panty hose.   Tell your sexual partner that you have a yeast infection. They should go to their caregiver if they have symptoms such as mild rash or itching.   Your sexual partner should be treated as well if your infection is difficult to eliminate.   Practice safer sex. Use condoms.   Some vaginal medications cause latex condoms to fail. Vaginal medications that harm condoms are:   Cleocin cream.   Butoconazole (Femstat).   Terconazole (Terazol) vaginal suppository.   Miconazole (Monistat) (may be purchased over the counter).  SEEK MEDICAL CARE IF:   You have a temperature by mouth above 102 F (38.9 C).   The infection is getting worse after 2 days of treatment.   The infection is not getting better after 3 days of treatment.   You develop blisters in or around your vagina.   You develop vaginal bleeding, and it is not your menstrual period.   You have pain when you urinate.   You develop intestinal problems.   You have pain with sexual intercourse.  Document Released: 07/01/2005 Document Revised: 09/10/2011 Document Reviewed: 03/15/2009 Exit[employer] Patient Information 2012 [employer]. 

## 2012-06-27 ENCOUNTER — Ambulatory Visit (INDEPENDENT_AMBULATORY_CARE_PROVIDER_SITE_OTHER): Payer: BC Managed Care – HMO | Admitting: General Practice

## 2012-06-27 VITALS — BP 116/73 | HR 108 | Temp 99.2°F | Ht 65.5 in | Wt 152.8 lb

## 2012-06-27 DIAGNOSIS — O09219 Supervision of pregnancy with history of pre-term labor, unspecified trimester: Secondary | ICD-10-CM

## 2012-06-27 MED ORDER — HYDROXYPROGESTERONE CAPROATE 250 MG/ML IM OIL
250.0000 mg | TOPICAL_OIL | INTRAMUSCULAR | Status: DC
  Administered 2012-06-27: 250 mg via INTRAMUSCULAR

## 2012-07-04 ENCOUNTER — Ambulatory Visit (INDEPENDENT_AMBULATORY_CARE_PROVIDER_SITE_OTHER): Payer: BC Managed Care – HMO | Admitting: *Deleted

## 2012-07-04 ENCOUNTER — Encounter: Payer: Self-pay | Admitting: *Deleted

## 2012-07-04 VITALS — BP 118/76 | HR 105 | Temp 99.4°F | Ht 65.5 in | Wt 155.4 lb

## 2012-07-04 DIAGNOSIS — O09219 Supervision of pregnancy with history of pre-term labor, unspecified trimester: Secondary | ICD-10-CM

## 2012-07-04 MED ORDER — HYDROXYPROGESTERONE CAPROATE 250 MG/ML IM OIL
250.0000 mg | TOPICAL_OIL | Freq: Once | INTRAMUSCULAR | Status: AC
  Administered 2012-07-04: 250 mg via INTRAMUSCULAR

## 2012-07-11 ENCOUNTER — Encounter (HOSPITAL_COMMUNITY): Payer: Self-pay | Admitting: *Deleted

## 2012-07-11 ENCOUNTER — Inpatient Hospital Stay (HOSPITAL_COMMUNITY)
Admission: AD | Admit: 2012-07-11 | Discharge: 2012-07-11 | Disposition: A | Discharge status: Home / Self Care | Payer: BC Managed Care – HMO | Source: Ambulatory Visit | Attending: Obstetrics & Gynecology | Admitting: Obstetrics & Gynecology

## 2012-07-11 ENCOUNTER — Ambulatory Visit (INDEPENDENT_AMBULATORY_CARE_PROVIDER_SITE_OTHER): Payer: BC Managed Care – HMO | Admitting: Family Medicine

## 2012-07-11 VITALS — BP 108/71 | Temp 97.5°F | Wt 155.4 lb

## 2012-07-11 DIAGNOSIS — D352 Benign neoplasm of pituitary gland: Secondary | ICD-10-CM

## 2012-07-11 DIAGNOSIS — O99891 Other specified diseases and conditions complicating pregnancy: Secondary | ICD-10-CM | POA: Insufficient documentation

## 2012-07-11 DIAGNOSIS — O09219 Supervision of pregnancy with history of pre-term labor, unspecified trimester: Secondary | ICD-10-CM

## 2012-07-11 DIAGNOSIS — O0990 Supervision of high risk pregnancy, unspecified, unspecified trimester: Secondary | ICD-10-CM

## 2012-07-11 DIAGNOSIS — R109 Unspecified abdominal pain: Secondary | ICD-10-CM | POA: Insufficient documentation

## 2012-07-11 DIAGNOSIS — N915 Oligomenorrhea, unspecified: Secondary | ICD-10-CM

## 2012-07-11 DIAGNOSIS — K5900 Constipation, unspecified: Secondary | ICD-10-CM | POA: Insufficient documentation

## 2012-07-11 DIAGNOSIS — D353 Benign neoplasm of craniopharyngeal duct: Secondary | ICD-10-CM

## 2012-07-11 LAB — URINALYSIS, ROUTINE W REFLEX MICROSCOPIC
Ketones, ur: NEGATIVE mg/dL
Nitrite: NEGATIVE
Protein, ur: NEGATIVE mg/dL

## 2012-07-11 LAB — CBC
Platelets: 212 10*3/uL (ref 150–400)
RDW: 12.4 % (ref 11.5–15.5)
WBC: 7.5 10*3/uL (ref 4.0–10.5)

## 2012-07-11 LAB — POCT URINALYSIS DIP (DEVICE)
Glucose, UA: NEGATIVE mg/dL
Nitrite: NEGATIVE
Urobilinogen, UA: 0.2 mg/dL (ref 0.0–1.0)

## 2012-07-11 LAB — URINE MICROSCOPIC-ADD ON

## 2012-07-11 MED ORDER — HYDROXYPROGESTERONE CAPROATE 250 MG/ML IM OIL
250.0000 mg | TOPICAL_OIL | Freq: Once | INTRAMUSCULAR | Status: AC
  Administered 2012-07-11: 250 mg via INTRAMUSCULAR

## 2012-07-11 NOTE — MAU Provider Note (Signed)
Chief Complaint:  Pressure in lower abdomen    First Provider Initiated Contact with Patient 07/11/12 1644      HPI: Sarah Prince is a 26 y.o. G2P0101 at [redacted]w[redacted]d who presents to maternity admissions reporting intermittent pelvic and anal pressure that feels like something is opening or pushing down. Pain is worse when sitting and not alleviated by rest. Onset was today at 1300 after her visit to [facility]. She is also concerned they had trouble hearing the fetal heart.  Denies upper abdominal pain. No urinary sx. Has been constipated chronically and last BM 4 days ago. Has not tried anything for constipations.  Denies contractions, leakage of fluid or vaginal bleeding. Good fetal movement.  Student at [employer], walks a lot.   Pregnancy Course: Hx PTB, receives 17-P  Past Medical History: Past Medical History  Diagnosis Date  . Prolactinoma   . Preterm delivery     Past obstetric history: OB History    Grav Para Term Preterm Abortions TAB SAB Ect Mult Living   2 1  1      1      # Outc Date GA Lbr Len/2nd Wgt Sex Del Anes PTL Lv   1 PRE 11/09 [redacted]w[redacted]d  4lb2oz(1.871kg) M SVD  Yes Yes   Comments: Delivered at [facility], ROM at 33 weeks then delivered 4 days later   2 CUR               Past Surgical History: Past Surgical History  Procedure Date  . No past surgeries     Family History: Family History  Problem Relation Age of Onset  . Diabetes Mother     Social History: History  Substance Use Topics  . Smoking status: Never Smoker   . Smokeless tobacco: Never Used  . Alcohol Use: No    Allergies: No Known Allergies  Meds:  Prescriptions prior to admission  Medication Sig Dispense Refill  . Prenatal Vit-Fe Fumarate-FA (MULTIVITAMIN-PRENATAL) 27-0.8 MG TABS Take 1 tablet by mouth daily.  30 each  0    ROS: Pertinent findings in history of present illness.  Physical Exam  Blood pressure 118/75, pulse 95, temperature 99.1 F (37.3 C), temperature source Oral, resp. rate 18, height 5\' 6"   (1.676 m), weight 69.854 kg (154 lb), last menstrual period 01/01/2012. GENERAL: Well-developed, well-nourished female in no acute distress.  HEENT: normocephalic HEART: normal rate RESP: normal effort ABDOMEN: Soft, non-tender, gravid appropriate for gestational age EXTREMITIES: Nontender, no edema NEURO: alert and oriented VE: cx soft, mid ext 1/ int closedL//H No hemorrhoid, large amount stool in rectal vault    FHT:  Baseline 130-135 , moderate variability, accelerations present, no decelerations Contractions:none; some UI present   Labs: Results for orders placed in visit on 07/11/12 (from the past 24 hour(s))  POCT URINALYSIS DIP (DEVICE)     Status: Abnormal   Collection Time   07/11/12  8:46 AM      Component Value Range   Glucose, UA NEGATIVE  NEGATIVE mg/dL   Bilirubin Urine NEGATIVE  NEGATIVE   Ketones, ur NEGATIVE  NEGATIVE mg/dL   Specific Gravity, Urine 1.025  1.005 - 1.030   Hgb urine dipstick NEGATIVE  NEGATIVE   pH 6.0  5.0 - 8.0   Protein, ur NEGATIVE  NEGATIVE mg/dL   Urobilinogen, UA 0.2  0.0 - 1.0 mg/dL   Nitrite NEGATIVE  NEGATIVE   Leukocytes, UA SMALL (*) NEGATIVE  GLUCOSE TOLERANCE, 1 HOUR (50G) W/O FASTING     Status:  Normal (In process)   Collection Time   07/11/12 10:05 AM   Narrative:    Performed at:  [facility]                [address]                [place], [place] [address]  CBC     Status: Normal (In process)   Collection Time   07/11/12 10:05 AM   Narrative:    Performed at:  [facility]                [address], [address]                [place], [place] [address]  RPR     Status: Normal (In process)   Collection Time   07/11/12 10:05 AM   Narrative:    Performed at:  [facility]                [address]                [place], [place] [address]  HIV ANTIBODY (ROUTINE TESTING)     Status: Normal (In process)   Collection Time   07/11/12 10:05 AM   Narrative:    Performed  at:  [facility]                [address], [address]                [place], [place] [address]      Assessment: 1. H/O preterm delivery, currently pregnant   G2P0101 at [redacted]w[redacted]d Constipation  Plan: Discharge home Preterm labor precautions and AVS constipation. Start Colace 100 mg po qd, [place], increased fluids and high fiber F/U HRC 1 wk    Medication List     As of 07/11/2012  5:43 PM    TAKE these medications         multivitamin-prenatal 27-0.8 MG Tabs   Take 1 tablet by mouth daily.         Danae Orleans, CNM 07/11/2012 4:44 PM

## 2012-07-11 NOTE — Patient Instructions (Signed)
Pregnancy - Third Trimester  The third trimester of pregnancy (the last 3 months) is a period of the most rapid growth for you and your baby. The baby approaches a length of 20 inches and a weight of 6 to 10 pounds. The baby is adding on fat and getting ready for life outside your body. While inside, babies have periods of sleeping and waking, suck their thumbs, and hiccups. You can often feel small contractions of the uterus. This is false labor. It is also called Braxton-Hicks contractions. This is like a practice for labor. The usual problems in this stage of pregnancy include more difficulty breathing, swelling of the hands and feet from water retention, and having to urinate more often because of the uterus and baby pressing on your bladder.   PRENATAL EXAMS  · Blood work may continue to be done during prenatal exams. These tests are done to check on your health and the probable health of your baby. Blood work is used to follow your blood levels (hemoglobin). Anemia (low hemoglobin) is common during pregnancy. Iron and vitamins are given to help prevent this. You may also continue to be checked for diabetes. Some of the past blood tests may be done again.  · The size of the uterus is measured during each visit. This makes sure your baby is growing properly according to your pregnancy dates.  · Your blood pressure is checked every prenatal visit. This is to make sure you are not getting toxemia.  · Your urine is checked every prenatal visit for infection, diabetes and protein.  · Your weight is checked at each visit. This is done to make sure gains are happening at the suggested rate and that you and your baby are growing normally.  · Sometimes, an ultrasound is performed to confirm the position and the proper growth and development of the baby. This is a test done that bounces harmless sound waves off the baby so your caregiver can more accurately determine due dates.  · Discuss the type of pain medication and  anesthesia you will have during your labor and delivery.  · Discuss the possibility and anesthesia if a Cesarean Section might be necessary.  · Inform your caregiver if there is any mental or physical violence at home.  Sometimes, a specialized non-stress test, contraction stress test and biophysical profile are done to make sure the baby is not having a problem. Checking the amniotic fluid surrounding the baby is called an amniocentesis. The amniotic fluid is removed by sticking a needle into the belly (abdomen). This is sometimes done near the end of pregnancy if an early delivery is required. In this case, it is done to help make sure the baby's lungs are mature enough for the baby to live outside of the womb. If the lungs are not mature and it is unsafe to deliver the baby, an injection of cortisone medication is given to the mother 1 to 2 days before the delivery. This helps the baby's lungs mature and makes it safer to deliver the baby.  CHANGES OCCURING IN THE THIRD TRIMESTER OF PREGNANCY  Your body goes through many changes during pregnancy. They vary from person to person. Talk to your caregiver about changes you notice and are concerned about.  · During the last trimester, you have probably had an increase in your appetite. It is normal to have cravings for certain foods. This varies from person to person and pregnancy to pregnancy.  · You may begin to   get stretch marks on your hips, abdomen, and breasts. These are normal changes in the body during pregnancy. There are no exercises or medications to take which prevent this change.  · Constipation may be treated with a stool softener or adding bulk to your diet. Drinking lots of fluids, fiber in vegetables, fruits, and whole grains are helpful.  · Exercising is also helpful. If you have been very active up until your pregnancy, most of these activities can be continued during your pregnancy. If you have been less active, it is helpful to start an exercise  program such as walking. Consult your caregiver before starting exercise programs.  · Avoid all smoking, alcohol, un-prescribed drugs, herbs and "street drugs" during your pregnancy. These chemicals affect the formation and growth of the baby. Avoid chemicals throughout the pregnancy to ensure the delivery of a healthy infant.  · Backache, varicose veins and hemorrhoids may develop or get worse.  · You will tire more easily in the third trimester, which is normal.  · The baby's movements may be stronger and more often.  · You may become short of breath easily.  · Your belly button may stick out.  · A yellow discharge may leak from your breasts called colostrum.  · You may have a bloody mucus discharge. This usually occurs a few days to a week before labor begins.  HOME CARE INSTRUCTIONS   · Keep your caregiver's appointments. Follow your caregiver's instructions regarding medication use, exercise, and diet.  · During pregnancy, you are providing food for you and your baby. Continue to eat regular, well-balanced meals. Choose foods such as meat, fish, milk and other low fat dairy products, vegetables, fruits, and whole-grain breads and cereals. Your caregiver will tell you of the ideal weight gain.  · A physical sexual relationship may be continued throughout pregnancy if there are no other problems such as early (premature) leaking of amniotic fluid from the membranes, vaginal bleeding, or belly (abdominal) pain.  · Exercise regularly if there are no restrictions. Check with your caregiver if you are unsure of the safety of your exercises. Greater weight gain will occur in the last 2 trimesters of pregnancy. Exercising helps:  · Control your weight.  · Get you in shape for labor and delivery.  · You lose weight after you deliver.  · Rest a lot with legs elevated, or as needed for leg cramps or low back pain.  · Wear a good support or jogging bra for breast tenderness during pregnancy. This may help if worn during  sleep. Pads or tissues may be used in the bra if you are leaking colostrum.  · Do not use hot tubs, steam rooms, or saunas.  · Wear your seat belt when driving. This protects you and your baby if you are in an accident.  · Avoid raw meat, cat litter boxes and soil used by cats. These carry germs that can cause birth defects in the baby.  · It is easier to loose urine during pregnancy. Tightening up and strengthening the pelvic muscles will help with this problem. You can practice stopping your urination while you are going to the bathroom. These are the same muscles you need to strengthen. It is also the muscles you would use if you were trying to stop from passing gas. You can practice tightening these muscles up 10 times a set and repeating this about 3 times per day. Once you know what muscles to tighten up, do not perform these   exercises during urination. It is more likely to cause an infection by backing up the urine.  · Ask for help if you have financial, counseling or nutritional needs during pregnancy. Your caregiver will be able to offer counseling for these needs as well as refer you for other special needs.  · Make a list of emergency phone numbers and have them available.  · Plan on getting help from family or friends when you go home from the hospital.  · Make a trial run to the hospital.  · Take prenatal classes with the father to understand, practice and ask questions about the labor and delivery.  · Prepare the baby's room/nursery.  · Do not travel out of the city unless it is absolutely necessary and with the advice of your caregiver.  · Wear only low or no heal shoes to have better balance and prevent falling.  MEDICATIONS AND DRUG USE IN PREGNANCY  · Take prenatal vitamins as directed. The vitamin should contain 1 milligram of folic acid. Keep all vitamins out of reach of children. Only a couple vitamins or tablets containing iron may be fatal to a baby or young child when ingested.  · Avoid use  of all medications, including herbs, over-the-counter medications, not prescribed or suggested by your caregiver. Only take over-the-counter or prescription medicines for pain, discomfort, or fever as directed by your caregiver. Do not use aspirin, ibuprofen (Motrin®, Advil®, Nuprin®) or naproxen (Aleve®) unless OK'd by your caregiver.  · Let your caregiver also know about herbs you may be using.  · Alcohol is related to a number of birth defects. This includes fetal alcohol syndrome. All alcohol, in any form, should be avoided completely. Smoking will cause low birth rate and premature babies.  · Street/illegal drugs are very harmful to the baby. They are absolutely forbidden. A baby born to an addicted mother will be addicted at birth. The baby will go through the same withdrawal an adult does.  SEEK MEDICAL CARE IF:  You have any concerns or worries during your pregnancy. It is better to call with your questions if you feel they cannot wait, rather than worry about them.  DECISIONS ABOUT CIRCUMCISION  You may or may not know the sex of your baby. If you know your baby is a boy, it may be time to think about circumcision. Circumcision is the removal of the foreskin of the penis. This is the skin that covers the sensitive end of the penis. There is no proven medical need for this. Often this decision is made on what is popular at the time or based upon religious beliefs and social issues. You can discuss these issues with your caregiver or pediatrician.  SEEK IMMEDIATE MEDICAL CARE IF:   · An unexplained oral temperature above 102° F (38.9° C) develops, or as your caregiver suggests.  · You have leaking of fluid from the vagina (birth canal). If leaking membranes are suspected, take your temperature and tell your caregiver of this when you call.  · There is vaginal spotting, bleeding or passing clots. Tell your caregiver of the amount and how many pads are used.  · You develop a bad smelling vaginal discharge with  a change in the color from clear to white.  · You develop vomiting that lasts more than 24 hours.  · You develop chills or fever.  · You develop shortness of breath.  · You develop burning on urination.  · You loose more than 2 pounds of weight   or gain more than 2 pounds of weight or as suggested by your caregiver.  · You notice sudden swelling of your face, hands, and feet or legs.  · You develop belly (abdominal) pain. Round ligament discomfort is a common non-cancerous (benign) cause of abdominal pain in pregnancy. Your caregiver still must evaluate you.  · You develop a severe headache that does not go away.  · You develop visual problems, blurred or double vision.  · If you have not felt your baby move for more than 1 hour. If you think the baby is not moving as much as usual, eat something with sugar in it and lie down on your left side for an hour. The baby should move at least 4 to 5 times per hour. Call right away if your baby moves less than that.  · You fall, are in a car accident or any kind of trauma.  · There is mental or physical violence at home.  Document Released: 09/15/2001 Document Revised: 12/14/2011 Document Reviewed: 03/20/2009  [employer]® Patient Information ©2013 [employer].

## 2012-07-11 NOTE — Progress Notes (Signed)
No cramping/contractions, no LOF, no VB. Occasional blurry vision. No headache. Visual fields full to confrontation. 1 hour GTT today and 28 wk labs. On 17-p for hx preterm delivery (33 weeks PPROM). 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

## 2012-07-11 NOTE — MAU Provider Note (Signed)
Medical Screening exam and patient care preformed by advanced practice provider.  Agree with the above management.  

## 2012-07-11 NOTE — Progress Notes (Signed)
Pulse 94 Patient reports pain in upper abdomen and back  Needs CBC, RPR, HIV, 1 hr gtt @ 957

## 2012-07-11 NOTE — MAU Note (Signed)
Pressure in lower abdomen and anus like something is opening. Started around 1300. Delivered last baby at 33 wks. Is on 17P and being seen in [facility].

## 2012-07-12 ENCOUNTER — Telehealth: Payer: Self-pay | Admitting: *Deleted

## 2012-07-12 LAB — GLUCOSE TOLERANCE, 1 HOUR (50G) W/O FASTING: Glucose, 1 Hour GTT: 167 mg/dL — ABNORMAL HIGH (ref 70–140)

## 2012-07-12 LAB — HIV ANTIBODY (ROUTINE TESTING W REFLEX): HIV: NONREACTIVE

## 2012-07-12 NOTE — Telephone Encounter (Signed)
Called pt and left detailed message on her personal voice mail that 1hr GTT is abnormal. She will need the 3hr GTT to determine if she has Gestational Diabetes. Please call the appt line if she can come in on 10/11 for the 3hr GTT.

## 2012-07-12 NOTE — Telephone Encounter (Signed)
Message copied by Jill Side on Tue Jul 12, 2012 12:00 PM ------      Message from: FERRY, [place]      Created: Tue Jul 12, 2012 11:49 AM       Abnormal 1 hour GTT. Pt will need 3 hour GTT.

## 2012-07-13 NOTE — Telephone Encounter (Signed)
Called and left a message we are calling, please call back during office hours and ask for a nurse.

## 2012-07-13 NOTE — Telephone Encounter (Signed)
Called pt and pt stated that she would be here on Friday 07/15/12 @ taking son to school to do 3 hr.  I informed pt to not eat or drink after midnight and that she would need to be here at the facility for 3 hr.  Pt stated understanding and had no further questions.

## 2012-07-15 ENCOUNTER — Other Ambulatory Visit: Payer: BC Managed Care – HMO

## 2012-07-15 DIAGNOSIS — R7309 Other abnormal glucose: Secondary | ICD-10-CM

## 2012-07-16 LAB — GLUCOSE TOLERANCE, 3 HOURS
Glucose Tolerance, 1 hour: 182 mg/dL (ref 70–189)
Glucose Tolerance, 2 hour: 158 mg/dL (ref 70–164)
Glucose Tolerance, Fasting: 77 mg/dL (ref 70–104)
Glucose, GTT - 3 Hour: 129 mg/dL (ref 70–144)

## 2012-07-18 ENCOUNTER — Ambulatory Visit (INDEPENDENT_AMBULATORY_CARE_PROVIDER_SITE_OTHER): Payer: BC Managed Care – HMO | Admitting: Obstetrics and Gynecology

## 2012-07-18 VITALS — BP 121/74 | HR 98

## 2012-07-18 DIAGNOSIS — O09219 Supervision of pregnancy with history of pre-term labor, unspecified trimester: Secondary | ICD-10-CM

## 2012-07-18 MED ORDER — HYDROXYPROGESTERONE CAPROATE 250 MG/ML IM OIL
250.0000 mg | TOPICAL_OIL | Freq: Once | INTRAMUSCULAR | Status: AC
  Administered 2012-07-18: 250 mg via INTRAMUSCULAR

## 2012-07-25 ENCOUNTER — Ambulatory Visit (INDEPENDENT_AMBULATORY_CARE_PROVIDER_SITE_OTHER): Payer: BC Managed Care – HMO | Admitting: Medical

## 2012-07-25 DIAGNOSIS — O09219 Supervision of pregnancy with history of pre-term labor, unspecified trimester: Secondary | ICD-10-CM

## 2012-07-25 MED ORDER — HYDROXYPROGESTERONE CAPROATE 250 MG/ML IM OIL
250.0000 mg | TOPICAL_OIL | INTRAMUSCULAR | Status: DC
  Administered 2012-07-25: 250 mg via INTRAMUSCULAR

## 2012-07-27 ENCOUNTER — Emergency Department (HOSPITAL_COMMUNITY)
Admission: EM | Admit: 2012-07-27 | Discharge: 2012-07-27 | Disposition: A | Discharge status: Home / Self Care | Payer: BC Managed Care – HMO | Attending: Emergency Medicine | Admitting: Emergency Medicine

## 2012-07-27 ENCOUNTER — Encounter (HOSPITAL_COMMUNITY): Payer: Self-pay | Admitting: Family Medicine

## 2012-07-27 DIAGNOSIS — Z79899 Other long term (current) drug therapy: Secondary | ICD-10-CM | POA: Insufficient documentation

## 2012-07-27 DIAGNOSIS — R55 Syncope and collapse: Secondary | ICD-10-CM | POA: Insufficient documentation

## 2012-07-27 DIAGNOSIS — Z8589 Personal history of malignant neoplasm of other organs and systems: Secondary | ICD-10-CM | POA: Insufficient documentation

## 2012-07-27 DIAGNOSIS — Z8751 Personal history of pre-term labor: Secondary | ICD-10-CM | POA: Insufficient documentation

## 2012-07-27 LAB — CBC WITH DIFFERENTIAL/PLATELET
Basophils Absolute: 0 10*3/uL (ref 0.0–0.1)
Basophils Relative: 0 % (ref 0–1)
HCT: 32.2 % — ABNORMAL LOW (ref 36.0–46.0)
MCHC: 34.8 g/dL (ref 30.0–36.0)
Monocytes Absolute: 0.8 10*3/uL (ref 0.1–1.0)
Neutro Abs: 5.3 10*3/uL (ref 1.7–7.7)
Neutrophils Relative %: 67 % (ref 43–77)
Platelets: 177 10*3/uL (ref 150–400)
RDW: 12.5 % (ref 11.5–15.5)
WBC: 8 10*3/uL (ref 4.0–10.5)

## 2012-07-27 LAB — COMPREHENSIVE METABOLIC PANEL
ALT: 14 U/L (ref 0–35)
AST: 14 U/L (ref 0–37)
Albumin: 2.8 g/dL — ABNORMAL LOW (ref 3.5–5.2)
Chloride: 102 mEq/L (ref 96–112)
Creatinine, Ser: 0.42 mg/dL — ABNORMAL LOW (ref 0.50–1.10)
Sodium: 134 mEq/L — ABNORMAL LOW (ref 135–145)
Total Bilirubin: 0.1 mg/dL — ABNORMAL LOW (ref 0.3–1.2)

## 2012-07-27 NOTE — ED Notes (Signed)
Pt was at work and walking back to desk and had a syncopal episode. Pt did not eat this am. 7 months pregnant. CBG 87. Sinus tach at 110. BP 120/75. Gravida 2 para 1. Denies abdominal pain or vaginal leakage.

## 2012-07-27 NOTE — ED Notes (Addendum)
Rapid OB paged. Pt sts she has felt the baby move.

## 2012-07-27 NOTE — Progress Notes (Signed)
Dr. Penne Lash notified of EFM monitoring of 3 contractions detected over last 4 hours with uterine irritability. FHR tracing category I Baseline 140 with moderate variability, accels present, no decels. MD notified of Labwork. Stated pt my be discharged per ED.

## 2012-07-27 NOTE — Progress Notes (Signed)
Instructed pt on PTL precautions and informed pt to be seen at [facility] for [facility] related issues including contractions, decreased fetal movement, leaking fluid or vaginal bleeding. Also instructed if just doesn't feel "right" to be seen at [facility]. Pt verbalized understanding.

## 2012-07-27 NOTE — Progress Notes (Addendum)
At pt bedside. Pt G2P1 with history of PTL/delivery at 33 weeks. Pt states falling to knees and doesn't remember if she fell on her stomach. Abd soft nontender. Pt denies contractions/cramping, vaginal bleeding or leaking fluid. Pt states only pain is right knee. No visual external injury noted. Pt reports positive fetal movement. Pt states she is [facility] due to hx of PTD.

## 2012-07-27 NOTE — ED Provider Notes (Signed)
History     CSN: [id number]  Arrival date & time 07/27/12  4098   First MD Initiated Contact with Patient 07/27/12 1302      Chief Complaint  Patient presents with  . Loss of Consciousness    (Consider location/radiation/quality/duration/timing/severity/associated sxs/prior treatment) HPI Patient suffered  syncopal today while walking at work. She felt lightheaded immediately prior to the event. She awakened to find workers at her side. She does not think she was unconscious for more than one or 2 minutes. She is presently asymptomatic except for a mild right-sided parietal headache which is minimal. Headache was onset after regaining consciousness. Patient is presently [redacted] weeks pregnant. : [facility]. She denies abdominal pain denies contractions. Past Medical History  Diagnosis Date  . Prolactinoma   . Preterm delivery     Past Surgical History  Procedure Date  . No past surgeries     Family History  Problem Relation Age of Onset  . Diabetes Mother     History  Substance Use Topics  . Smoking status: Never Smoker   . Smokeless tobacco: Never Used  . Alcohol Use: No    OB History    Grav Para Term Preterm Abortions TAB SAB Ect Mult Living   2 1  1      1       Review of Systems  Constitutional: Negative.   Respiratory: Negative.   Cardiovascular: Negative.   Gastrointestinal: Negative.   Genitourinary:       Pregnant  Musculoskeletal: Negative.   Skin: Negative.   Neurological: Positive for dizziness and headaches.  Hematological: Negative.   Psychiatric/Behavioral: Negative.   All other systems reviewed and are negative.    Allergies  Review of patient's allergies indicates no known allergies.  Home Medications   Current Outpatient Rx  Name Route Sig Dispense Refill  . PRENATAL 27-0.8 MG PO TABS Oral Take 1 tablet by mouth daily. 30 each 0    BP 110/71  Pulse 92  Temp 98.6 F (37 C)  Resp 17  SpO2 99%  LMP 01/01/2012  Physical  Exam  Nursing note and vitals reviewed. Constitutional: She is oriented to person, place, and time. She appears well-developed and well-nourished.  HENT:  Head: Normocephalic and atraumatic.  Eyes: Conjunctivae normal are normal. Pupils are equal, round, and reactive to light.  Neck: Neck supple. No tracheal deviation present. No thyromegaly present.  Cardiovascular: Normal rate and regular rhythm.   No murmur heard. Pulmonary/Chest: Effort normal and breath sounds normal.  Abdominal: Soft. Bowel sounds are normal. She exhibits no distension. There is no tenderness.       Gravid  Musculoskeletal: Normal range of motion. She exhibits no edema and no tenderness.  Neurological: She is alert and oriented to person, place, and time. Coordination normal.       Gait normal not lightheaded on standing  Skin: Skin is warm and dry. No rash noted.  Psychiatric: She has a normal mood and affect.    ED Course  Procedures (including critical care time)  Labs Reviewed  CBC WITH DIFFERENTIAL - Abnormal; Notable for the following:    Hemoglobin 11.2 (*)     HCT 32.2 (*)     All other components within normal limits  COMPREHENSIVE METABOLIC PANEL - Abnormal; Notable for the following:    Sodium 134 (*)     BUN 5 (*)     Creatinine, Ser 0.42 (*)     Albumin 2.8 (*)  Total Bilirubin 0.1 (*)     All other components within normal limits  GLUCOSE, CAPILLARY   No results found.   No diagnosis found.   Patient was monitored for 4 hours by rapid response OB nurse. Abnormal activity. From an OB standpoint she is stable to follow up at the [facility] clinic with her neck egularly scheduled visit   Date: 07/27/2012  Rate: 90  Rhythm: normal sinus rhythm  QRS Axis: normal  Intervals: normal  ST/T Wave abnormalities: normal  Conduction Disutrbances:none  Narrative Interpretation:   Old EKG Reviewed: none available Results for orders placed during the hospital encounter of 07/27/12  CBC WITH  DIFFERENTIAL      Component Value Range   WBC 8.0  4.0 - 10.5 K/uL   RBC 3.90  3.87 - 5.11 MIL/uL   Hemoglobin 11.2 (*) 12.0 - 15.0 g/dL   HCT 14.7 (*) 82.9 - 56.2 %   MCV 82.6  78.0 - 100.0 fL   MCH 28.7  26.0 - 34.0 pg   MCHC 34.8  30.0 - 36.0 g/dL   RDW 13.0  86.5 - 78.4 %   Platelets 177  150 - 400 K/uL   Neutrophils Relative 67  43 - 77 %   Neutro Abs 5.3  1.7 - 7.7 K/uL   Lymphocytes Relative 18  12 - 46 %   Lymphs Abs 1.4  0.7 - 4.0 K/uL   Monocytes Relative 11  3 - 12 %   Monocytes Absolute 0.8  0.1 - 1.0 K/uL   Eosinophils Relative 5  0 - 5 %   Eosinophils Absolute 0.4  0.0 - 0.7 K/uL   Basophils Relative 0  0 - 1 %   Basophils Absolute 0.0  0.0 - 0.1 K/uL  COMPREHENSIVE METABOLIC PANEL      Component Value Range   Sodium 134 (*) 135 - 145 mEq/L   Potassium 3.7  3.5 - 5.1 mEq/L   Chloride 102  96 - 112 mEq/L   CO2 22  19 - 32 mEq/L   Glucose, Bld 79  70 - 99 mg/dL   BUN 5 (*) 6 - 23 mg/dL   Creatinine, Ser 6.96 (*) 0.50 - 1.10 mg/dL   Calcium 8.7  8.4 - 29.5 mg/dL   Total Protein 6.4  6.0 - 8.3 g/dL   Albumin 2.8 (*) 3.5 - 5.2 g/dL   AST 14  0 - 37 U/L   ALT 14  0 - 35 U/L   Alkaline Phosphatase 98  39 - 117 U/L   Total Bilirubin 0.1 (*) 0.3 - 1.2 mg/dL   GFR calc non Af Amer >90  >90 mL/min   GFR calc Af Amer >90  >90 mL/min  GLUCOSE, CAPILLARY      Component Value Range   Glucose-Capillary 78  70 - 99 mg/dL   No results found.  MDM  Syncope not felt to be of serious etiology. Doubt pulmonary embolism patient has no chest pain no shortness of breath doubt subarachnoid hemorrhage. Patient has mild headache onset after syncope. She declines medication for headache presently she is discharged in stable condition Diagnosis syncope        Doug Sou, MD 07/27/12 1416

## 2012-07-27 NOTE — Progress Notes (Addendum)
Call from [facility] ED stating pt was in route at 7 mos pregnant with syncope episode. OB RR RN in route

## 2012-07-27 NOTE — ED Notes (Signed)
OB at bedside placing pt on external fetal monitor.

## 2012-07-27 NOTE — Progress Notes (Signed)
Dr. Penne Lash called and notified of G2P1 29 5/[redacted] week pregnant patient at [facility] ED with syncope episode who states she fell to her knee and was found on her back. Reported history of PTD at 33 weeks due to SROM. At this time pt denies vaginal bleeding or leaking fluid and reports positive fetal movement. Orders for 4 hours of monitoring and to Call [facility] with lab results and EFM tracing. Pt can remain in ED for monitoring at this time.

## 2012-08-01 ENCOUNTER — Ambulatory Visit (INDEPENDENT_AMBULATORY_CARE_PROVIDER_SITE_OTHER): Payer: BC Managed Care – HMO | Admitting: Advanced Practice Midwife

## 2012-08-01 ENCOUNTER — Encounter: Payer: Self-pay | Admitting: Advanced Practice Midwife

## 2012-08-01 VITALS — BP 124/72 | Temp 97.8°F | Wt 158.0 lb

## 2012-08-01 DIAGNOSIS — O09219 Supervision of pregnancy with history of pre-term labor, unspecified trimester: Secondary | ICD-10-CM

## 2012-08-01 DIAGNOSIS — Z23 Encounter for immunization: Secondary | ICD-10-CM

## 2012-08-01 LAB — POCT URINALYSIS DIP (DEVICE)
Bilirubin Urine: NEGATIVE
Glucose, UA: NEGATIVE mg/dL
Hgb urine dipstick: NEGATIVE
Specific Gravity, Urine: 1.025 (ref 1.005–1.030)
Urobilinogen, UA: 0.2 mg/dL (ref 0.0–1.0)

## 2012-08-01 MED ORDER — INFLUENZA VIRUS VACC SPLIT PF IM SUSP
0.5000 mL | Freq: Once | INTRAMUSCULAR | Status: AC
  Administered 2012-08-01: 0.5 mL via INTRAMUSCULAR

## 2012-08-01 MED ORDER — HYDROXYPROGESTERONE CAPROATE 250 MG/ML IM OIL
250.0000 mg | TOPICAL_OIL | INTRAMUSCULAR | Status: DC
  Administered 2012-08-01: 250 mg via INTRAMUSCULAR

## 2012-08-01 NOTE — Progress Notes (Signed)
Doing well. Had a fall last week due to syncope. Admits did not eat or drink that day due to busy schedule. No further episodes.   No headaches or visual changes. Feels well. No contractions. Glucola 78

## 2012-08-01 NOTE — Progress Notes (Signed)
Pulse 115. Pt reports edema trace in face.

## 2012-08-01 NOTE — Patient Instructions (Addendum)
Pregnancy - Third Trimester  The third trimester of pregnancy (the last 3 months) is a period of the most rapid growth for you and your baby. The baby approaches a length of 20 inches and a weight of 6 to 10 pounds. The baby is adding on fat and getting ready for life outside your body. While inside, babies have periods of sleeping and waking, suck their thumbs, and hiccups. You can often feel small contractions of the uterus. This is false labor. It is also called Braxton-Hicks contractions. This is like a practice for labor. The usual problems in this stage of pregnancy include more difficulty breathing, swelling of the hands and feet from water retention, and having to urinate more often because of the uterus and baby pressing on your bladder.   PRENATAL EXAMS  · Blood work may continue to be done during prenatal exams. These tests are done to check on your health and the probable health of your baby. Blood work is used to follow your blood levels (hemoglobin). Anemia (low hemoglobin) is common during pregnancy. Iron and vitamins are given to help prevent this. You may also continue to be checked for diabetes. Some of the past blood tests may be done again.  · The size of the uterus is measured during each visit. This makes sure your baby is growing properly according to your pregnancy dates.  · Your blood pressure is checked every prenatal visit. This is to make sure you are not getting toxemia.  · Your urine is checked every prenatal visit for infection, diabetes and protein.  · Your weight is checked at each visit. This is done to make sure gains are happening at the suggested rate and that you and your baby are growing normally.  · Sometimes, an ultrasound is performed to confirm the position and the proper growth and development of the baby. This is a test done that bounces harmless sound waves off the baby so your caregiver can more accurately determine due dates.  · Discuss the type of pain medication and  anesthesia you will have during your labor and delivery.  · Discuss the possibility and anesthesia if a Cesarean Section might be necessary.  · Inform your caregiver if there is any mental or physical violence at home.  Sometimes, a specialized non-stress test, contraction stress test and biophysical profile are done to make sure the baby is not having a problem. Checking the amniotic fluid surrounding the baby is called an amniocentesis. The amniotic fluid is removed by sticking a needle into the belly (abdomen). This is sometimes done near the end of pregnancy if an early delivery is required. In this case, it is done to help make sure the baby's lungs are mature enough for the baby to live outside of the womb. If the lungs are not mature and it is unsafe to deliver the baby, an injection of cortisone medication is given to the mother 1 to 2 days before the delivery. This helps the baby's lungs mature and makes it safer to deliver the baby.  CHANGES OCCURING IN THE THIRD TRIMESTER OF PREGNANCY  Your body goes through many changes during pregnancy. They vary from person to person. Talk to your caregiver about changes you notice and are concerned about.  · During the last trimester, you have probably had an increase in your appetite. It is normal to have cravings for certain foods. This varies from person to person and pregnancy to pregnancy.  · You may begin to   get stretch marks on your hips, abdomen, and breasts. These are normal changes in the body during pregnancy. There are no exercises or medications to take which prevent this change.  · Constipation may be treated with a stool softener or adding bulk to your diet. Drinking lots of fluids, fiber in vegetables, fruits, and whole grains are helpful.  · Exercising is also helpful. If you have been very active up until your pregnancy, most of these activities can be continued during your pregnancy. If you have been less active, it is helpful to start an exercise  program such as walking. Consult your caregiver before starting exercise programs.  · Avoid all smoking, alcohol, un-prescribed drugs, herbs and "street drugs" during your pregnancy. These chemicals affect the formation and growth of the baby. Avoid chemicals throughout the pregnancy to ensure the delivery of a healthy infant.  · Backache, varicose veins and hemorrhoids may develop or get worse.  · You will tire more easily in the third trimester, which is normal.  · The baby's movements may be stronger and more often.  · You may become short of breath easily.  · Your belly button may stick out.  · A yellow discharge may leak from your breasts called colostrum.  · You may have a bloody mucus discharge. This usually occurs a few days to a week before labor begins.  HOME CARE INSTRUCTIONS   · Keep your caregiver's appointments. Follow your caregiver's instructions regarding medication use, exercise, and diet.  · During pregnancy, you are providing food for you and your baby. Continue to eat regular, well-balanced meals. Choose foods such as meat, fish, milk and other low fat dairy products, vegetables, fruits, and whole-grain breads and cereals. Your caregiver will tell you of the ideal weight gain.  · A physical sexual relationship may be continued throughout pregnancy if there are no other problems such as early (premature) leaking of amniotic fluid from the membranes, vaginal bleeding, or belly (abdominal) pain.  · Exercise regularly if there are no restrictions. Check with your caregiver if you are unsure of the safety of your exercises. Greater weight gain will occur in the last 2 trimesters of pregnancy. Exercising helps:  · Control your weight.  · Get you in shape for labor and delivery.  · You lose weight after you deliver.  · Rest a lot with legs elevated, or as needed for leg cramps or low back pain.  · Wear a good support or jogging bra for breast tenderness during pregnancy. This may help if worn during  sleep. Pads or tissues may be used in the bra if you are leaking colostrum.  · Do not use hot tubs, steam rooms, or saunas.  · Wear your seat belt when driving. This protects you and your baby if you are in an accident.  · Avoid raw meat, cat litter boxes and soil used by cats. These carry germs that can cause birth defects in the baby.  · It is easier to loose urine during pregnancy. Tightening up and strengthening the pelvic muscles will help with this problem. You can practice stopping your urination while you are going to the bathroom. These are the same muscles you need to strengthen. It is also the muscles you would use if you were trying to stop from passing gas. You can practice tightening these muscles up 10 times a set and repeating this about 3 times per day. Once you know what muscles to tighten up, do not perform these   exercises during urination. It is more likely to cause an infection by backing up the urine.  · Ask for help if you have financial, counseling or nutritional needs during pregnancy. Your caregiver will be able to offer counseling for these needs as well as refer you for other special needs.  · Make a list of emergency phone numbers and have them available.  · Plan on getting help from family or friends when you go home from the hospital.  · Make a trial run to the hospital.  · Take prenatal classes with the father to understand, practice and ask questions about the labor and delivery.  · Prepare the baby's room/nursery.  · Do not travel out of the city unless it is absolutely necessary and with the advice of your caregiver.  · Wear only low or no heal shoes to have better balance and prevent falling.  MEDICATIONS AND DRUG USE IN PREGNANCY  · Take prenatal vitamins as directed. The vitamin should contain 1 milligram of folic acid. Keep all vitamins out of reach of children. Only a couple vitamins or tablets containing iron may be fatal to a baby or young child when ingested.  · Avoid use  of all medications, including herbs, over-the-counter medications, not prescribed or suggested by your caregiver. Only take over-the-counter or prescription medicines for pain, discomfort, or fever as directed by your caregiver. Do not use aspirin, ibuprofen (Motrin®, Advil®, Nuprin®) or naproxen (Aleve®) unless OK'd by your caregiver.  · Let your caregiver also know about herbs you may be using.  · Alcohol is related to a number of birth defects. This includes fetal alcohol syndrome. All alcohol, in any form, should be avoided completely. Smoking will cause low birth rate and premature babies.  · Street/illegal drugs are very harmful to the baby. They are absolutely forbidden. A baby born to an addicted mother will be addicted at birth. The baby will go through the same withdrawal an adult does.  SEEK MEDICAL CARE IF:  You have any concerns or worries during your pregnancy. It is better to call with your questions if you feel they cannot wait, rather than worry about them.  DECISIONS ABOUT CIRCUMCISION  You may or may not know the sex of your baby. If you know your baby is a boy, it may be time to think about circumcision. Circumcision is the removal of the foreskin of the penis. This is the skin that covers the sensitive end of the penis. There is no proven medical need for this. Often this decision is made on what is popular at the time or based upon religious beliefs and social issues. You can discuss these issues with your caregiver or pediatrician.  SEEK IMMEDIATE MEDICAL CARE IF:   · An unexplained oral temperature above 102° F (38.9° C) develops, or as your caregiver suggests.  · You have leaking of fluid from the vagina (birth canal). If leaking membranes are suspected, take your temperature and tell your caregiver of this when you call.  · There is vaginal spotting, bleeding or passing clots. Tell your caregiver of the amount and how many pads are used.  · You develop a bad smelling vaginal discharge with  a change in the color from clear to white.  · You develop vomiting that lasts more than 24 hours.  · You develop chills or fever.  · You develop shortness of breath.  · You develop burning on urination.  · You loose more than 2 pounds of weight   or gain more than 2 pounds of weight or as suggested by your caregiver.  · You notice sudden swelling of your face, hands, and feet or legs.  · You develop belly (abdominal) pain. Round ligament discomfort is a common non-cancerous (benign) cause of abdominal pain in pregnancy. Your caregiver still must evaluate you.  · You develop a severe headache that does not go away.  · You develop visual problems, blurred or double vision.  · If you have not felt your baby move for more than 1 hour. If you think the baby is not moving as much as usual, eat something with sugar in it and lie down on your left side for an hour. The baby should move at least 4 to 5 times per hour. Call right away if your baby moves less than that.  · You fall, are in a car accident or any kind of trauma.  · There is mental or physical violence at home.  Document Released: 09/15/2001 Document Revised: 12/14/2011 Document Reviewed: 03/20/2009  [employer]® Patient Information ©2013 [employer].

## 2012-08-08 ENCOUNTER — Ambulatory Visit (INDEPENDENT_AMBULATORY_CARE_PROVIDER_SITE_OTHER): Payer: BC Managed Care – HMO | Admitting: Medical

## 2012-08-08 VITALS — BP 122/70 | HR 100

## 2012-08-08 DIAGNOSIS — O09219 Supervision of pregnancy with history of pre-term labor, unspecified trimester: Secondary | ICD-10-CM

## 2012-08-08 MED ORDER — HYDROXYPROGESTERONE CAPROATE 250 MG/ML IM OIL
250.0000 mg | TOPICAL_OIL | Freq: Once | INTRAMUSCULAR | Status: AC
  Administered 2012-08-08: 250 mg via INTRAMUSCULAR

## 2012-08-15 ENCOUNTER — Ambulatory Visit (INDEPENDENT_AMBULATORY_CARE_PROVIDER_SITE_OTHER): Payer: 59 | Admitting: Advanced Practice Midwife

## 2012-08-15 VITALS — BP 119/70 | Temp 98.0°F | Wt 160.7 lb

## 2012-08-15 DIAGNOSIS — Z0000 Encounter for general adult medical examination without abnormal findings: Secondary | ICD-10-CM

## 2012-08-15 LAB — POCT URINALYSIS DIP (DEVICE)
Bilirubin Urine: NEGATIVE
Hgb urine dipstick: NEGATIVE
Ketones, ur: NEGATIVE mg/dL
pH: 7 (ref 5.0–8.0)

## 2012-08-15 MED ORDER — HYDROXYPROGESTERONE CAPROATE 250 MG/ML IM OIL
250.0000 mg | TOPICAL_OIL | INTRAMUSCULAR | Status: DC
  Administered 2012-08-15: 250 mg via INTRAMUSCULAR

## 2012-08-15 NOTE — Progress Notes (Signed)
Doing well.  Good fetal movement, denies vaginal bleeding, LOF, regular contractions.  17P given today.

## 2012-08-15 NOTE — Progress Notes (Signed)
Pulse- 108 Edema-feet  Pain-pressure

## 2012-08-22 ENCOUNTER — Ambulatory Visit (INDEPENDENT_AMBULATORY_CARE_PROVIDER_SITE_OTHER): Payer: 59 | Admitting: Medical

## 2012-08-22 VITALS — BP 116/72 | HR 103

## 2012-08-22 DIAGNOSIS — Z0000 Encounter for general adult medical examination without abnormal findings: Secondary | ICD-10-CM

## 2012-08-22 MED ORDER — HYDROXYPROGESTERONE CAPROATE 250 MG/ML IM OIL
250.0000 mg | TOPICAL_OIL | Freq: Once | INTRAMUSCULAR | Status: AC
  Administered 2012-08-22: 250 mg via INTRAMUSCULAR

## 2012-08-29 ENCOUNTER — Encounter: Payer: BC Managed Care – HMO | Admitting: Obstetrics and Gynecology

## 2012-08-30 ENCOUNTER — Ambulatory Visit (INDEPENDENT_AMBULATORY_CARE_PROVIDER_SITE_OTHER): Payer: BC Managed Care – HMO

## 2012-08-30 VITALS — BP 123/80 | HR 111 | Wt 163.1 lb

## 2012-08-30 DIAGNOSIS — O09219 Supervision of pregnancy with history of pre-term labor, unspecified trimester: Secondary | ICD-10-CM

## 2012-08-30 MED ORDER — HYDROXYPROGESTERONE CAPROATE 250 MG/ML IM OIL
250.0000 mg | TOPICAL_OIL | Freq: Once | INTRAMUSCULAR | Status: AC
  Administered 2012-08-30: 250 mg via INTRAMUSCULAR

## 2012-09-05 ENCOUNTER — Ambulatory Visit (INDEPENDENT_AMBULATORY_CARE_PROVIDER_SITE_OTHER): Payer: 59 | Admitting: Family Medicine

## 2012-09-05 VITALS — BP 131/76 | Temp 98.1°F | Wt 160.2 lb

## 2012-09-05 DIAGNOSIS — O09219 Supervision of pregnancy with history of pre-term labor, unspecified trimester: Secondary | ICD-10-CM

## 2012-09-05 DIAGNOSIS — O99810 Abnormal glucose complicating pregnancy: Secondary | ICD-10-CM

## 2012-09-05 DIAGNOSIS — O0990 Supervision of high risk pregnancy, unspecified, unspecified trimester: Secondary | ICD-10-CM

## 2012-09-05 LAB — POCT URINALYSIS DIP (DEVICE)
Ketones, ur: NEGATIVE mg/dL
Protein, ur: NEGATIVE mg/dL
Specific Gravity, Urine: 1.015 (ref 1.005–1.030)
pH: 6 (ref 5.0–8.0)

## 2012-09-05 MED ORDER — HYDROXYPROGESTERONE CAPROATE 250 MG/ML IM OIL
250.0000 mg | TOPICAL_OIL | Freq: Once | INTRAMUSCULAR | Status: AC
  Administered 2012-09-05: 250 mg via INTRAMUSCULAR

## 2012-09-05 NOTE — Patient Instructions (Signed)
Pregnancy - Third Trimester The third trimester of pregnancy (the last 3 months) is a period of the most rapid growth for you and your baby. The baby approaches a length of 20 inches and a weight of 6 to 10 pounds. The baby is adding on fat and getting ready for life outside your body. While inside, babies have periods of sleeping and waking, suck their thumbs, and hiccups. You can often feel small contractions of the uterus. This is false labor. It is also called Braxton-Hicks contractions. This is like a practice for labor. The usual problems in this stage of pregnancy include more difficulty breathing, swelling of the hands and feet from water retention, and having to urinate more often because of the uterus and baby pressing on your bladder.  PRENATAL EXAMS  Blood work may continue to be done during prenatal exams. These tests are done to check on your health and the probable health of your baby. Blood work is used to follow your blood levels (hemoglobin). Anemia (low hemoglobin) is common during pregnancy. Iron and vitamins are given to help prevent this. You may also continue to be checked for diabetes. Some of the past blood tests may be done again.  The size of the uterus is measured during each visit. This makes sure your baby is growing properly according to your pregnancy dates.  Your blood pressure is checked every prenatal visit. This is to make sure you are not getting toxemia.  Your urine is checked every prenatal visit for infection, diabetes and protein.  Your weight is checked at each visit. This is done to make sure gains are happening at the suggested rate and that you and your baby are growing normally.  Sometimes, an ultrasound is performed to confirm the position and the proper growth and development of the baby. This is a test done that bounces harmless sound waves off the baby so your caregiver can more accurately determine due dates.  Discuss the type of pain medication  and anesthesia you will have during your labor and delivery.  Discuss the possibility and anesthesia if a Cesarean Section might be necessary.  Inform your caregiver if there is any mental or physical violence at home. Sometimes, a specialized non-stress test, contraction stress test and biophysical profile are done to make sure the baby is not having a problem. Checking the amniotic fluid surrounding the baby is called an amniocentesis. The amniotic fluid is removed by sticking a needle into the belly (abdomen). This is sometimes done near the end of pregnancy if an early delivery is required. In this case, it is done to help make sure the baby's lungs are mature enough for the baby to live outside of the womb. If the lungs are not mature and it is unsafe to deliver the baby, an injection of cortisone medication is given to the mother 1 to 2 days before the delivery. This helps the baby's lungs mature and makes it safer to deliver the baby. CHANGES OCCURING IN THE THIRD TRIMESTER OF PREGNANCY Your body goes through many changes during pregnancy. They vary from person to person. Talk to your caregiver about changes you notice and are concerned about.  During the last trimester, you have probably had an increase in your appetite. It is normal to have cravings for certain foods. This varies from person to person and pregnancy to pregnancy.  You may begin to get stretch marks on your hips, abdomen, and breasts. These are normal changes in the body   during pregnancy. There are no exercises or medications to take which prevent this change.  Constipation may be treated with a stool softener or adding bulk to your diet. Drinking lots of fluids, fiber in vegetables, fruits, and whole grains are helpful.  Exercising is also helpful. If you have been very active up until your pregnancy, most of these activities can be continued during your pregnancy. If you have been less active, it is helpful to start an  exercise program such as walking. Consult your caregiver before starting exercise programs.  Avoid all smoking, alcohol, un-prescribed drugs, herbs and "street drugs" during your pregnancy. These chemicals affect the formation and growth of the baby. Avoid chemicals throughout the pregnancy to ensure the delivery of a healthy infant.  Backache, varicose veins and hemorrhoids may develop or get worse.  You will tire more easily in the third trimester, which is normal.  The baby's movements may be stronger and more often.  You may become short of breath easily.  Your belly button may stick out.  A yellow discharge may leak from your breasts called colostrum.  You may have a bloody mucus discharge. This usually occurs a few days to a week before labor begins. HOME CARE INSTRUCTIONS   Keep your caregiver's appointments. Follow your caregiver's instructions regarding medication use, exercise, and diet.  During pregnancy, you are providing food for you and your baby. Continue to eat regular, well-balanced meals. Choose foods such as meat, fish, milk and other low fat dairy products, vegetables, fruits, and whole-grain breads and cereals. Your caregiver will tell you of the ideal weight gain.  A physical sexual relationship may be continued throughout pregnancy if there are no other problems such as early (premature) leaking of amniotic fluid from the membranes, vaginal bleeding, or belly (abdominal) pain.  Exercise regularly if there are no restrictions. Check with your caregiver if you are unsure of the safety of your exercises. Greater weight gain will occur in the last 2 trimesters of pregnancy. Exercising helps:  Control your weight.  Get you in shape for labor and delivery.  You lose weight after you deliver.  Rest a lot with legs elevated, or as needed for leg cramps or low back pain.  Wear a good support or jogging bra for breast tenderness during pregnancy. This may help if worn  during sleep. Pads or tissues may be used in the bra if you are leaking colostrum.  Do not use hot tubs, steam rooms, or saunas.  Wear your seat belt when driving. This protects you and your baby if you are in an accident.  Avoid raw meat, cat litter boxes and soil used by cats. These carry germs that can cause birth defects in the baby.  It is easier to loose urine during pregnancy. Tightening up and strengthening the pelvic muscles will help with this problem. You can practice stopping your urination while you are going to the bathroom. These are the same muscles you need to strengthen. It is also the muscles you would use if you were trying to stop from passing gas. You can practice tightening these muscles up 10 times a set and repeating this about 3 times per day. Once you know what muscles to tighten up, do not perform these exercises during urination. It is more likely to cause an infection by backing up the urine.  Ask for help if you have financial, counseling or nutritional needs during pregnancy. Your caregiver will be able to offer counseling for these   needs as well as refer you for other special needs.  Make a list of emergency phone numbers and have them available.  Plan on getting help from family or friends when you go home from the hospital.  Make a trial run to the hospital.  Take prenatal classes with the father to understand, practice and ask questions about the labor and delivery.  Prepare the baby's room/nursery.  Do not travel out of the city unless it is absolutely necessary and with the advice of your caregiver.  Wear only low or no heal shoes to have better balance and prevent falling. MEDICATIONS AND DRUG USE IN PREGNANCY  Take prenatal vitamins as directed. The vitamin should contain 1 milligram of folic acid. Keep all vitamins out of reach of children. Only a couple vitamins or tablets containing iron may be fatal to a baby or young child when  ingested.  Avoid use of all medications, including herbs, over-the-counter medications, not prescribed or suggested by your caregiver. Only take over-the-counter or prescription medicines for pain, discomfort, or fever as directed by your caregiver. Do not use aspirin, ibuprofen (Motrin, Advil, Nuprin) or naproxen (Aleve) unless OK'd by your caregiver.  Let your caregiver also know about herbs you may be using.  Alcohol is related to a number of birth defects. This includes fetal alcohol syndrome. All alcohol, in any form, should be avoided completely. Smoking will cause low birth rate and premature babies.  Street/illegal drugs are very harmful to the baby. They are absolutely forbidden. A baby born to an addicted mother will be addicted at birth. The baby will go through the same withdrawal an adult does. SEEK MEDICAL CARE IF: You have any concerns or worries during your pregnancy. It is better to call with your questions if you feel they cannot wait, rather than worry about them. DECISIONS ABOUT CIRCUMCISION You may or may not know the sex of your baby. If you know your baby is a boy, it may be time to think about circumcision. Circumcision is the removal of the foreskin of the penis. This is the skin that covers the sensitive end of the penis. There is no proven medical need for this. Often this decision is made on what is popular at the time or based upon religious beliefs and social issues. You can discuss these issues with your caregiver or pediatrician. SEEK IMMEDIATE MEDICAL CARE IF:   An unexplained oral temperature above 102 F (38.9 C) develops, or as your caregiver suggests.  You have leaking of fluid from the vagina (birth canal). If leaking membranes are suspected, take your temperature and tell your caregiver of this when you call.  There is vaginal spotting, bleeding or passing clots. Tell your caregiver of the amount and how many pads are used.  You develop a bad smelling  vaginal discharge with a change in the color from clear to white.  You develop vomiting that lasts more than 24 hours.  You develop chills or fever.  You develop shortness of breath.  You develop burning on urination.  You loose more than 2 pounds of weight or gain more than 2 pounds of weight or as suggested by your caregiver.  You notice sudden swelling of your face, hands, and feet or legs.  You develop belly (abdominal) pain. Round ligament discomfort is a common non-cancerous (benign) cause of abdominal pain in pregnancy. Your caregiver still must evaluate you.  You develop a severe headache that does not go away.  You develop visual   problems, blurred or double vision.  If you have not felt your baby move for more than 1 hour. If you think the baby is not moving as much as usual, eat something with sugar in it and lie down on your left side for an hour. The baby should move at least 4 to 5 times per hour. Call right away if your baby moves less than that.  You fall, are in a car accident or any kind of trauma.  There is mental or physical violence at home. Document Released: 09/15/2001 Document Revised: 12/14/2011 Document Reviewed: 03/20/2009 [employer] Patient Information 2013 [employer].  Breastfeeding Deciding to breastfeed is one of the best choices you can make for you and your baby. The information that follows gives a brief overview of the benefits of breastfeeding as well as common topics surrounding breastfeeding. BENEFITS OF BREASTFEEDING For the baby  The first milk (colostrum) helps the baby's digestive system function better.   There are antibodies in the mother's milk that help the baby fight off infections.   The baby has a lower incidence of asthma, allergies, and sudden infant death syndrome (SIDS).   The nutrients in breast milk are better for the baby than infant formulas, and breast milk helps the baby's brain grow better.   Babies who  breastfeed have less gas, colic, and constipation.  For the mother  Breastfeeding helps develop a very special bond between the mother and her baby.   Breastfeeding is convenient, always available at the correct temperature, and costs nothing.   Breastfeeding burns calories in the mother and helps her lose weight that was gained during pregnancy.   Breastfeeding makes the uterus contract back down to normal size faster and slows bleeding following delivery.   Breastfeeding mothers have a lower risk of developing breast cancer.  BREASTFEEDING FREQUENCY  A healthy, full-term baby may breastfeed as often as every hour or space his or her feedings to every 3 hours.   Watch your baby for signs of hunger. Nurse your baby if he or she shows signs of hunger. How often you nurse will vary from baby to baby.   Nurse as often as the baby requests, or when you feel the need to reduce the fullness of your breasts.   Awaken the baby if it has been 3 4 hours since the last feeding.   Frequent feeding will help the mother make more milk and will help prevent problems, such as sore nipples and engorgement of the breasts.  BABY'S POSITION AT THE BREAST  Whether lying down or sitting, be sure that the baby's tummy is facing your tummy.   Support the breast with 4 fingers underneath the breast and the thumb above. Make sure your fingers are well away from the nipple and baby's mouth.   Stroke the baby's lips gently with your finger or nipple.   When the baby's mouth is open wide enough, place all of your nipple and as much of the areola as possible into your baby's mouth.   Pull the baby in close so the tip of the nose and the baby's cheeks touch the breast during the feeding.  FEEDINGS AND SUCTION  The length of each feeding varies from baby to baby and from feeding to feeding.   The baby must suck about 2 3 minutes for your milk to get to him or her. This is called a "let down."  For this reason, allow the baby to feed on each breast as   long as he or she wants. Your baby will end the feeding when he or she has received the right balance of nutrients.   To break the suction, put your finger into the corner of the baby's mouth and slide it between his or her gums before removing your breast from his or her mouth. This will help prevent sore nipples.  HOW TO TELL WHETHER YOUR BABY IS GETTING ENOUGH BREAST MILK. Wondering whether or not your baby is getting enough milk is a common concern among mothers. You can be assured that your baby is getting enough milk if:   Your baby is actively sucking and you hear swallowing.   Your baby seems relaxed and satisfied after a feeding.   Your baby nurses at least 8 12 times in a 24 hour time period. Nurse your baby until he or she unlatches or falls asleep at the first breast (at least 10 20 minutes), then offer the second side.   Your baby is wetting 5 6 disposable diapers (6 8 cloth diapers) in a 24 hour period by 5 6 days of age.   Your baby is having at least 3 4 stools every 24 hours for the first 6 weeks. The stool should be soft and yellow.   Your baby should gain 4 7 ounces per week after he or she is 4 days old.   Your breasts feel softer after nursing.  REDUCING BREAST ENGORGEMENT  In the first week after your baby is born, you may experience signs of breast engorgement. When breasts are engorged, they feel heavy, warm, full, and may be tender to the touch. You can reduce engorgement if you:   Nurse frequently, every 2 3 hours. Mothers who breastfeed early and often have fewer problems with engorgement.   Place light ice packs on your breasts for 10 20 minutes between feedings. This reduces swelling. Wrap the ice packs in a lightweight towel to protect your skin. Bags of frozen vegetables work well for this purpose.   Take a warm shower or apply warm, moist heat to your breast for 5 10 minutes just before  each feeding. This increases circulation and helps the milk flow.   Gently massage your breast before and during the feeding. Using your finger tips, massage from the chest wall towards your nipple in a circular motion.   Make sure that the baby empties at least one breast at every feeding before switching sides.   Use a breast pump to empty the breasts if your baby is sleepy or not nursing well. You may also want to pump if you are returning to work oryou feel you are getting engorged.   Avoid bottle feeds, pacifiers, or supplemental feedings of water or juice in place of breastfeeding. Breast milk is all the food your baby needs. It is not necessary for your baby to have water or formula. In fact, to help your breasts make more milk, it is best not to give your baby supplemental feedings during the early weeks.   Be sure the baby is latched on and positioned properly while breastfeeding.   Wear a supportive bra, avoiding underwire styles.   Eat a balanced diet with enough fluids.   Rest often, relax, and take your prenatal vitamins to prevent fatigue, stress, and anemia.  If you follow these suggestions, your engorgement should improve in 24 48 hours. If you are still experiencing difficulty, call your lactation consultant or caregiver.  CARING FOR YOURSELF Take care of your   breasts  Bathe or shower daily.   Avoid using soap on your nipples.   Start feedings on your left breast at one feeding and on your right breast at the next feeding.   You will notice an increase in your milk supply 2 5 days after delivery. You may feel some discomfort from engorgement, which makes your breasts very firm and often tender. Engorgement "peaks" out within 24 48 hours. In the meantime, apply warm moist towels to your breasts for 5 10 minutes before feeding. Gentle massage and expression of some milk before feeding will soften your breasts, making it easier for your baby to latch on.    Wear a well-fitting nursing bra, and air dry your nipples for a 3 4minutes after each feeding.   Only use cotton bra pads.   Only use pure lanolin on your nipples after nursing. You do not need to wash it off before feeding the baby again. Another option is to express a few drops of breast milk and gently massage it into your nipples.  Take care of yourself  Eat well-balanced meals and nutritious snacks.   Drinking milk, fruit juice, and water to satisfy your thirst (about 8 glasses a day).   Get plenty of rest.  Avoid foods that you notice affect the baby in a bad way.  SEEK MEDICAL CARE IF:   You have difficulty with breastfeeding and need help.   You have a hard, red, sore area on your breast that is accompanied by a fever.   Your baby is too sleepy to eat well or is having trouble sleeping.   Your baby is wetting less than 6 diapers a day, by 5 days of age.   Your baby's skin or white part of his or her eyes is more yellow than it was in the hospital.   You feel depressed.  Document Released: 09/21/2005 Document Revised: 03/22/2012 Document Reviewed: 12/20/2011 [employer] Patient Information 2013 [employer].  

## 2012-09-05 NOTE — Progress Notes (Signed)
Borderline 3 hour--random BS 78--Check 1 hour pp today (120) and fasting next wk. S< D, reports good FM.

## 2012-09-05 NOTE — Progress Notes (Signed)
Pulse: 114 Notices an increased amount of white vaginal discharge. Denies itching or odor.

## 2012-09-12 ENCOUNTER — Ambulatory Visit (INDEPENDENT_AMBULATORY_CARE_PROVIDER_SITE_OTHER): Payer: BC Managed Care – HMO | Admitting: Obstetrics and Gynecology

## 2012-09-12 VITALS — BP 122/77 | Temp 98.7°F | Wt 161.5 lb

## 2012-09-12 DIAGNOSIS — O09219 Supervision of pregnancy with history of pre-term labor, unspecified trimester: Secondary | ICD-10-CM

## 2012-09-12 DIAGNOSIS — D573 Sickle-cell trait: Secondary | ICD-10-CM

## 2012-09-12 LAB — OB RESULTS CONSOLE GBS: GBS: NEGATIVE

## 2012-09-12 LAB — POCT URINALYSIS DIP (DEVICE)
Ketones, ur: NEGATIVE mg/dL
Protein, ur: NEGATIVE mg/dL
Specific Gravity, Urine: 1.015 (ref 1.005–1.030)
pH: 6 (ref 5.0–8.0)

## 2012-09-12 LAB — OB RESULTS CONSOLE GC/CHLAMYDIA: Gonorrhea: NEGATIVE

## 2012-09-12 NOTE — Progress Notes (Signed)
Pulse: 110 

## 2012-09-12 NOTE — Patient Instructions (Addendum)
Fetal Movement Counts Patient Name: __________________________________________________ Patient Due Date: ____________________ Kick counts is highly recommended in high risk pregnancies, but it is a good idea for every pregnant woman to do. Start counting fetal movements at 28 weeks of the pregnancy. Fetal movements increase after eating a full meal or eating or drinking something sweet (the blood sugar is higher). It is also important to drink plenty of fluids (well hydrated) before doing the count. Lie on your left side because it helps with the circulation or you can sit in a comfortable chair with your arms over your belly (abdomen) with no distractions around you. DOING THE COUNT  Try to do the count the same time of day each time you do it.  Mark the day and time, then see how long it takes for you to feel 10 movements (kicks, flutters, swishes, rolls). You should have at least 10 movements within 2 hours. You will most likely feel 10 movements in much less than 2 hours. If you do not, wait an hour and count again. After a couple of days you will see a pattern.  What you are looking for is a change in the pattern or not enough counts in 2 hours. Is it taking longer in time to reach 10 movements? SEEK MEDICAL CARE IF:  You feel less than 10 counts in 2 hours. Tried twice.  No movement in one hour.  The pattern is changing or taking longer each day to reach 10 counts in 2 hours.  You feel the baby is not moving as it usually does. Date: ____________ Movements: ____________ Start time: ____________ Finish time: ____________  Date: ____________ Movements: ____________ Start time: ____________ Finish time: ____________ Date: ____________ Movements: ____________ Start time: ____________ Finish time: ____________ Date: ____________ Movements: ____________ Start time: ____________ Finish time: ____________ Date: ____________ Movements: ____________ Start time: ____________ Finish time:  ____________ Date: ____________ Movements: ____________ Start time: ____________ Finish time: ____________ Date: ____________ Movements: ____________ Start time: ____________ Finish time: ____________ Date: ____________ Movements: ____________ Start time: ____________ Finish time: ____________  Date: ____________ Movements: ____________ Start time: ____________ Finish time: ____________ Date: ____________ Movements: ____________ Start time: ____________ Finish time: ____________ Date: ____________ Movements: ____________ Start time: ____________ Finish time: ____________ Date: ____________ Movements: ____________ Start time: ____________ Finish time: ____________ Date: ____________ Movements: ____________ Start time: ____________ Finish time: ____________ Date: ____________ Movements: ____________ Start time: ____________ Finish time: ____________ Date: ____________ Movements: ____________ Start time: ____________ Finish time: ____________  Date: ____________ Movements: ____________ Start time: ____________ Finish time: ____________ Date: ____________ Movements: ____________ Start time: ____________ Finish time: ____________ Date: ____________ Movements: ____________ Start time: ____________ Finish time: ____________ Date: ____________ Movements: ____________ Start time: ____________ Finish time: ____________ Date: ____________ Movements: ____________ Start time: ____________ Finish time: ____________ Date: ____________ Movements: ____________ Start time: ____________ Finish time: ____________ Date: ____________ Movements: ____________ Start time: ____________ Finish time: ____________  Date: ____________ Movements: ____________ Start time: ____________ Finish time: ____________ Date: ____________ Movements: ____________ Start time: ____________ Finish time: ____________ Date: ____________ Movements: ____________ Start time: ____________ Finish time: ____________ Date: ____________ Movements:  ____________ Start time: ____________ Finish time: ____________ Date: ____________ Movements: ____________ Start time: ____________ Finish time: ____________ Date: ____________ Movements: ____________ Start time: ____________ Finish time: ____________ Date: ____________ Movements: ____________ Start time: ____________ Finish time: ____________  Date: ____________ Movements: ____________ Start time: ____________ Finish time: ____________ Date: ____________ Movements: ____________ Start time: ____________ Finish time: ____________ Date: ____________ Movements: ____________ Start time: ____________ Finish time: ____________ Date: ____________ Movements:   ____________ Start time: ____________ Finish time: ____________ Date: ____________ Movements: ____________ Start time: ____________ Finish time: ____________ Date: ____________ Movements: ____________ Start time: ____________ Finish time: ____________ Date: ____________ Movements: ____________ Start time: ____________ Finish time: ____________  Date: ____________ Movements: ____________ Start time: ____________ Finish time: ____________ Date: ____________ Movements: ____________ Start time: ____________ Finish time: ____________ Date: ____________ Movements: ____________ Start time: ____________ Finish time: ____________ Date: ____________ Movements: ____________ Start time: ____________ Finish time: ____________ Date: ____________ Movements: ____________ Start time: ____________ Finish time: ____________ Date: ____________ Movements: ____________ Start time: ____________ Finish time: ____________ Date: ____________ Movements: ____________ Start time: ____________ Finish time: ____________  Date: ____________ Movements: ____________ Start time: ____________ Finish time: ____________ Date: ____________ Movements: ____________ Start time: ____________ Finish time: ____________ Date: ____________ Movements: ____________ Start time: ____________ Finish  time: ____________ Date: ____________ Movements: ____________ Start time: ____________ Finish time: ____________ Date: ____________ Movements: ____________ Start time: ____________ Finish time: ____________ Date: ____________ Movements: ____________ Start time: ____________ Finish time: ____________ Date: ____________ Movements: ____________ Start time: ____________ Finish time: ____________  Date: ____________ Movements: ____________ Start time: ____________ Finish time: ____________ Date: ____________ Movements: ____________ Start time: ____________ Finish time: ____________ Date: ____________ Movements: ____________ Start time: ____________ Finish time: ____________ Date: ____________ Movements: ____________ Start time: ____________ Finish time: ____________ Date: ____________ Movements: ____________ Start time: ____________ Finish time: ____________ Date: ____________ Movements: ____________ Start time: ____________ Finish time: ____________ Document Released: 10/21/2006 Document Revised: 12/14/2011 Document Reviewed: 04/23/2009 [employer] Patient Information 2013 [employer]. Contraception Choices Contraception (birth control) is the use of any methods or devices to prevent pregnancy. Below are some methods to help avoid pregnancy. HORMONAL METHODS   Contraceptive implant. This is a thin, plastic tube containing progesterone hormone. It does not contain estrogen hormone. Your caregiver inserts the tube in the inner part of the upper arm. The tube can remain in place for up to 3 years. After 3 years, the implant must be removed. The implant prevents the ovaries from releasing an egg (ovulation), thickens the cervical mucus which prevents sperm from entering the uterus, and thins the lining of the inside of the uterus.  Progesterone-only injections. These injections are given every 3 months by your caregiver to prevent pregnancy. This synthetic progesterone hormone stops the ovaries from  releasing eggs. It also thickens cervical mucus and changes the uterine lining. This makes it harder for sperm to survive in the uterus.  Birth control pills. These pills contain estrogen and progesterone hormone. They work by stopping the egg from forming in the ovary (ovulation). Birth control pills are prescribed by a caregiver.Birth control pills can also be used to treat heavy periods.  Minipill. This type of birth control pill contains only the progesterone hormone. They are taken every day of each month and must be prescribed by your caregiver.  Birth control patch. The patch contains hormones similar to those in birth control pills. It must be changed once a week and is prescribed by a caregiver.  Vaginal ring. The ring contains hormones similar to those in birth control pills. It is left in the vagina for 3 weeks, removed for 1 week, and then a new one is put back in place. The patient must be comfortable inserting and removing the ring from the vagina.A caregiver's prescription is necessary.  Emergency contraception. Emergency contraceptives prevent pregnancy after unprotected sexual intercourse. This pill can be taken right after sex or up to 5 days after unprotected sex. It is most effective the sooner you take the pills   after having sexual intercourse. Emergency contraceptive pills are available without a prescription. Check with your pharmacist. Do not use emergency contraception as your only form of birth control. BARRIER METHODS   Female condom. This is a thin sheath (latex or rubber) that is worn over the penis during sexual intercourse. It can be used with spermicide to increase effectiveness.  Female condom. This is a soft, loose-fitting sheath that is put into the vagina before sexual intercourse.  Diaphragm. This is a soft, latex, dome-shaped barrier that must be fitted by a caregiver. It is inserted into the vagina, along with a spermicidal jelly. It is inserted before  intercourse. The diaphragm should be left in the vagina for 6 to 8 hours after intercourse.  Cervical cap. This is a round, soft, latex or plastic cup that fits over the cervix and must be fitted by a caregiver. The cap can be left in place for up to 48 hours after intercourse.  Sponge. This is a soft, circular piece of polyurethane foam. The sponge has spermicide in it. It is inserted into the vagina after wetting it and before sexual intercourse.  Spermicides. These are chemicals that kill or block sperm from entering the cervix and uterus. They come in the form of creams, jellies, suppositories, foam, or tablets. They do not require a prescription. They are inserted into the vagina with an applicator before having sexual intercourse. The process must be repeated every time you have sexual intercourse. INTRAUTERINE CONTRACEPTION  Intrauterine device (IUD). This is a T-shaped device that is put in a woman's uterus during a menstrual period to prevent pregnancy. There are 2 types:  Copper IUD. This type of IUD is wrapped in copper wire and is placed inside the uterus. Copper makes the uterus and fallopian tubes produce a fluid that kills sperm. It can stay in place for 10 years.  Hormone IUD. This type of IUD contains the hormone progestin (synthetic progesterone). The hormone thickens the cervical mucus and prevents sperm from entering the uterus, and it also thins the uterine lining to prevent implantation of a fertilized egg. The hormone can weaken or kill the sperm that get into the uterus. It can stay in place for 5 years. PERMANENT METHODS OF CONTRACEPTION  Female tubal ligation. This is when the woman's fallopian tubes are surgically sealed, tied, or blocked to prevent the egg from traveling to the uterus.  Female sterilization. This is when the female has the tubes that carry sperm tied off (vasectomy).This blocks sperm from entering the vagina during sexual intercourse. After the procedure,  the man can still ejaculate fluid (semen). NATURAL PLANNING METHODS  Natural family planning. This is not having sexual intercourse or using a barrier method (condom, diaphragm, cervical cap) on days the woman could become pregnant.  Calendar method. This is keeping track of the length of each menstrual cycle and identifying when you are fertile.  Ovulation method. This is avoiding sexual intercourse during ovulation.  Symptothermal method. This is avoiding sexual intercourse during ovulation, using a thermometer and ovulation symptoms.  Post-ovulation method. This is timing sexual intercourse after you have ovulated. Regardless of which type or method of contraception you choose, it is important that you use condoms to protect against the transmission of sexually transmitted diseases (STDs). Talk with your caregiver about which form of contraception is most appropriate for you. Document Released: 09/21/2005 Document Revised: 12/14/2011 Document Reviewed: 01/28/2011 [employer] Patient Information 2013 [employer].  

## 2012-09-12 NOTE — Addendum Note (Signed)
Addended by: Sherre Lain A on: 09/12/2012 11:28 AM   Modules accepted: Orders

## 2012-09-12 NOTE — Progress Notes (Signed)
Undecided contraception - dislikes L[facility] because wants to have regular periods. Infor given.  FBS: 91

## 2012-09-19 ENCOUNTER — Ambulatory Visit (INDEPENDENT_AMBULATORY_CARE_PROVIDER_SITE_OTHER): Payer: BC Managed Care – HMO | Admitting: Family Medicine

## 2012-09-19 ENCOUNTER — Encounter: Payer: Self-pay | Admitting: Family Medicine

## 2012-09-19 VITALS — BP 124/84 | Temp 99.1°F | Wt 161.2 lb

## 2012-09-19 DIAGNOSIS — O365990 Maternal care for other known or suspected poor fetal growth, unspecified trimester, not applicable or unspecified: Secondary | ICD-10-CM

## 2012-09-19 DIAGNOSIS — D573 Sickle-cell trait: Secondary | ICD-10-CM

## 2012-09-19 DIAGNOSIS — R8281 Pyuria: Secondary | ICD-10-CM

## 2012-09-19 DIAGNOSIS — R82998 Other abnormal findings in urine: Secondary | ICD-10-CM

## 2012-09-19 LAB — POCT URINALYSIS DIP (DEVICE)
Bilirubin Urine: NEGATIVE
Ketones, ur: NEGATIVE mg/dL
pH: 6.5 (ref 5.0–8.0)

## 2012-09-19 NOTE — Patient Instructions (Signed)
Breastfeeding Deciding to breastfeed is one of the best choices you can make for you and your baby. The information that follows gives a brief overview of the benefits of breastfeeding as well as common topics surrounding breastfeeding. BENEFITS OF BREASTFEEDING For the baby  The first milk (colostrum) helps the baby's digestive system function better.   There are antibodies in the mother's milk that help the baby fight off infections.   The baby has a lower incidence of asthma, allergies, and sudden infant death syndrome (SIDS).   The nutrients in breast milk are better for the baby than infant formulas, and breast milk helps the baby's brain grow better.   Babies who breastfeed have less gas, colic, and constipation.  For the mother  Breastfeeding helps develop a very special bond between the mother and her baby.   Breastfeeding is convenient, always available at the correct temperature, and costs nothing.   Breastfeeding burns calories in the mother and helps her lose weight that was gained during pregnancy.   Breastfeeding makes the uterus contract back down to normal size faster and slows bleeding following delivery.   Breastfeeding mothers have a lower risk of developing breast cancer.  BREASTFEEDING FREQUENCY  A healthy, full-term baby may breastfeed as often as every hour or space his or her feedings to every 3 hours.   Watch your baby for signs of hunger. Nurse your baby if he or she shows signs of hunger. How often you nurse will vary from baby to baby.   Nurse as often as the baby requests, or when you feel the need to reduce the fullness of your breasts.   Awaken the baby if it has been 3 4 hours since the last feeding.   Frequent feeding will help the mother make more milk and will help prevent problems, such as sore nipples and engorgement of the breasts.  BABY'S POSITION AT THE BREAST  Whether lying down or sitting, be sure that the baby's tummy  is facing your tummy.   Support the breast with 4 fingers underneath the breast and the thumb above. Make sure your fingers are well away from the nipple and baby's mouth.   Stroke the baby's lips gently with your finger or nipple.   When the baby's mouth is open wide enough, place all of your nipple and as much of the areola as possible into your baby's mouth.   Pull the baby in close so the tip of the nose and the baby's cheeks touch the breast during the feeding.  FEEDINGS AND SUCTION  The length of each feeding varies from baby to baby and from feeding to feeding.   The baby must suck about 2 3 minutes for your milk to get to him or her. This is called a "let down." For this reason, allow the baby to feed on each breast as long as he or she wants. Your baby will end the feeding when he or she has received the right balance of nutrients.   To break the suction, put your finger into the corner of the baby's mouth and slide it between his or her gums before removing your breast from his or her mouth. This will help prevent sore nipples.  HOW TO TELL WHETHER YOUR BABY IS GETTING ENOUGH BREAST MILK. Wondering whether or not your baby is getting enough milk is a common concern among mothers. You can be assured that your baby is getting enough milk if:   Your baby is actively   sucking and you hear swallowing.   Your baby seems relaxed and satisfied after a feeding.   Your baby nurses at least 8 12 times in a 24 hour time period. Nurse your baby until he or she unlatches or falls asleep at the first breast (at least 10 20 minutes), then offer the second side.   Your baby is wetting 5 6 disposable diapers (6 8 cloth diapers) in a 24 hour period by 5 6 days of age.   Your baby is having at least 3 4 stools every 24 hours for the first 6 weeks. The stool should be soft and yellow.   Your baby should gain 4 7 ounces per week after he or she is 4 days old.   Your breasts feel  softer after nursing.  REDUCING BREAST ENGORGEMENT  In the first week after your baby is born, you may experience signs of breast engorgement. When breasts are engorged, they feel heavy, warm, full, and may be tender to the touch. You can reduce engorgement if you:   Nurse frequently, every 2 3 hours. Mothers who breastfeed early and often have fewer problems with engorgement.   Place light ice packs on your breasts for 10 20 minutes between feedings. This reduces swelling. Wrap the ice packs in a lightweight towel to protect your skin. Bags of frozen vegetables work well for this purpose.   Take a warm shower or apply warm, moist heat to your breast for 5 10 minutes just before each feeding. This increases circulation and helps the milk flow.   Gently massage your breast before and during the feeding. Using your finger tips, massage from the chest wall towards your nipple in a circular motion.   Make sure that the baby empties at least one breast at every feeding before switching sides.   Use a breast pump to empty the breasts if your baby is sleepy or not nursing well. You may also want to pump if you are returning to work oryou feel you are getting engorged.   Avoid bottle feeds, pacifiers, or supplemental feedings of water or juice in place of breastfeeding. Breast milk is all the food your baby needs. It is not necessary for your baby to have water or formula. In fact, to help your breasts make more milk, it is best not to give your baby supplemental feedings during the early weeks.   Be sure the baby is latched on and positioned properly while breastfeeding.   Wear a supportive bra, avoiding underwire styles.   Eat a balanced diet with enough fluids.   Rest often, relax, and take your prenatal vitamins to prevent fatigue, stress, and anemia.  If you follow these suggestions, your engorgement should improve in 24 48 hours. If you are still experiencing difficulty, call  your lactation consultant or caregiver.  CARING FOR YOURSELF Take care of your breasts  Bathe or shower daily.   Avoid using soap on your nipples.   Start feedings on your left breast at one feeding and on your right breast at the next feeding.   You will notice an increase in your milk supply 2 5 days after delivery. You may feel some discomfort from engorgement, which makes your breasts very firm and often tender. Engorgement "peaks" out within 24 48 hours. In the meantime, apply warm moist towels to your breasts for 5 10 minutes before feeding. Gentle massage and expression of some milk before feeding will soften your breasts, making it easier for your   baby to latch on.   Wear a well-fitting nursing bra, and air dry your nipples for a 3 4minutes after each feeding.   Only use cotton bra pads.   Only use pure lanolin on your nipples after nursing. You do not need to wash it off before feeding the baby again. Another option is to express a few drops of breast milk and gently massage it into your nipples.  Take care of yourself  Eat well-balanced meals and nutritious snacks.   Drinking milk, fruit juice, and water to satisfy your thirst (about 8 glasses a day).   Get plenty of rest.  Avoid foods that you notice affect the baby in a bad way.  SEEK MEDICAL CARE IF:   You have difficulty with breastfeeding and need help.   You have a hard, red, sore area on your breast that is accompanied by a fever.   Your baby is too sleepy to eat well or is having trouble sleeping.   Your baby is wetting less than 6 diapers a day, by 5 days of age.   Your baby's skin or white part of his or her eyes is more yellow than it was in the hospital.   You feel depressed.  Document Released: 09/21/2005 Document Revised: 03/22/2012 Document Reviewed: 12/20/2011 ExitCare Patient Information 2013 [employer]. Normal Labor and Delivery Your caregiver must first be sure you are in  labor. Signs of labor include:  You may pass what is called "the mucus plug" before labor begins. This is a small amount of blood stained mucus.  Regular uterine contractions.  The time between contractions get closer together.  The discomfort and pain gradually gets more intense.  Pains are mostly located in the back.  Pains get worse when walking.  The cervix (the opening of the uterus becomes thinner (begins to efface) and opens up (dilates). Once you are in labor and admitted into the hospital or care center, your caregiver will do the following:  A complete physical examination.  Check your vital signs (blood pressure, pulse, temperature and the fetal heart rate).  Do a vaginal examination (using a sterile glove and lubricant) to determine:  The position (presentation) of the baby (head [vertex] or buttock first).  The level (station) of the baby's head in the birth canal.  The effacement and dilatation of the cervix.  You may have your pubic hair shaved and be given an enema depending on your caregiver and the circumstance.  An electronic monitor is usually placed on your abdomen. The monitor follows the length and intensity of the contractions, as well as the baby's heart rate.  Usually, your caregiver will insert an IV in your arm with a bottle of sugar water. This is done as a precaution so that medications can be given to you quickly during labor or delivery. NORMAL LABOR AND DELIVERY IS DIVIDED UP INTO 3 STAGES: First Stage This is when regular contractions begin and the cervix begins to efface and dilate. This stage can last from 3 to 15 hours. The end of the first stage is when the cervix is 100% effaced and 10 centimeters dilated. Pain medications may be given by   Injection (morphine, demerol, etc.)  Regional anesthesia (spinal, caudal or epidural, anesthetics given in different locations of the spine). Paracervical pain medication may be given, which is an  injection of and anesthetic on each side of the cervix. A pregnant woman may request to have "Natural Childbirth" which is not to have   any medications or anesthesia during her labor and delivery. Second Stage This is when the baby comes down through the birth canal (vagina) and is born. This can take 1 to 4 hours. As the baby's head comes down through the birth canal, you may feel like you are going to have a bowel movement. You will get the urge to bear down and push until the baby is delivered. As the baby's head is being delivered, the caregiver will decide if an episiotomy (a cut in the perineum and vagina area) is needed to prevent tearing of the tissue in this area. The episiotomy is sewn up after the delivery of the baby and placenta. Sometimes a mask with nitrous oxide is given for the mother to breath during the delivery of the baby to help if there is too much pain. The end of Stage 2 is when the baby is fully delivered. Then when the umbilical cord stops pulsating it is clamped and cut. Third Stage The third stage begins after the baby is completely delivered and ends after the placenta (afterbirth) is delivered. This usually takes 5 to 30 minutes. After the placenta is delivered, a medication is given either by intravenous or injection to help contract the uterus and prevent bleeding. The third stage is not painful and pain medication is usually not necessary. If an episiotomy was done, it is repaired at this time. After the delivery, the mother is watched and monitored closely for 1 to 2 hours to make sure there is no postpartum bleeding (hemorrhage). If there is a lot of bleeding, medication is given to contract the uterus and stop the bleeding. Document Released: 06/30/2008 Document Revised: 12/14/2011 Document Reviewed: 06/30/2008 ExitCare Patient Information 2013 [employer].  

## 2012-09-19 NOTE — Progress Notes (Signed)
Large leuks--check cx BS ok. U/S for S<D

## 2012-09-19 NOTE — Progress Notes (Signed)
p=118 

## 2012-09-19 NOTE — Progress Notes (Signed)
U/S scheduled 09/20/12 at 4 pm.

## 2012-09-20 ENCOUNTER — Ambulatory Visit (HOSPITAL_COMMUNITY)
Admission: RE | Admit: 2012-09-20 | Discharge: 2012-09-20 | Disposition: A | Discharge status: Home / Self Care | Payer: 59 | Source: Ambulatory Visit | Attending: Family Medicine | Admitting: Family Medicine

## 2012-09-20 DIAGNOSIS — Z3689 Encounter for other specified antenatal screening: Secondary | ICD-10-CM | POA: Insufficient documentation

## 2012-09-20 DIAGNOSIS — O365990 Maternal care for other known or suspected poor fetal growth, unspecified trimester, not applicable or unspecified: Secondary | ICD-10-CM | POA: Insufficient documentation

## 2012-09-21 LAB — CULTURE, OB URINE

## 2012-09-26 ENCOUNTER — Encounter: Payer: BC Managed Care – HMO | Admitting: Obstetrics & Gynecology

## 2012-09-29 ENCOUNTER — Ambulatory Visit (INDEPENDENT_AMBULATORY_CARE_PROVIDER_SITE_OTHER): Payer: BC Managed Care – HMO | Admitting: Family Medicine

## 2012-09-29 VITALS — BP 128/80 | Temp 98.0°F | Wt 166.1 lb

## 2012-09-29 DIAGNOSIS — O09219 Supervision of pregnancy with history of pre-term labor, unspecified trimester: Secondary | ICD-10-CM

## 2012-09-29 LAB — POCT URINALYSIS DIP (DEVICE)
Bilirubin Urine: NEGATIVE
Glucose, UA: NEGATIVE mg/dL
Ketones, ur: NEGATIVE mg/dL
Protein, ur: NEGATIVE mg/dL

## 2012-09-29 NOTE — Progress Notes (Signed)
Pulse 104 Patient reports back pain

## 2012-09-29 NOTE — Progress Notes (Signed)
No LOF or bleeding. No contractions. No concerns today. Labor precautions discussed.

## 2012-09-29 NOTE — Patient Instructions (Addendum)
Pregnancy - Third Trimester  The third trimester of pregnancy (the last 3 months) is a period of the most rapid growth for you and your baby. The baby approaches a length of 20 inches and a weight of 6 to 10 pounds. The baby is adding on fat and getting ready for life outside your body. While inside, babies have periods of sleeping and waking, suck their thumbs, and hiccups. You can often feel small contractions of the uterus. This is false labor. It is also called Braxton-Hicks contractions. This is like a practice for labor. The usual problems in this stage of pregnancy include more difficulty breathing, swelling of the hands and feet from water retention, and having to urinate more often because of the uterus and baby pressing on your bladder.   PRENATAL EXAMS  · Blood work may continue to be done during prenatal exams. These tests are done to check on your health and the probable health of your baby. Blood work is used to follow your blood levels (hemoglobin). Anemia (low hemoglobin) is common during pregnancy. Iron and vitamins are given to help prevent this. You may also continue to be checked for diabetes. Some of the past blood tests may be done again.  · The size of the uterus is measured during each visit. This makes sure your baby is growing properly according to your pregnancy dates.  · Your blood pressure is checked every prenatal visit. This is to make sure you are not getting toxemia.  · Your urine is checked every prenatal visit for infection, diabetes and protein.  · Your weight is checked at each visit. This is done to make sure gains are happening at the suggested rate and that you and your baby are growing normally.  · Sometimes, an ultrasound is performed to confirm the position and the proper growth and development of the baby. This is a test done that bounces harmless sound waves off the baby so your caregiver can more accurately determine due dates.  · Discuss the type of pain medication and  anesthesia you will have during your labor and delivery.  · Discuss the possibility and anesthesia if a Cesarean Section might be necessary.  · Inform your caregiver if there is any mental or physical violence at home.  Sometimes, a specialized non-stress test, contraction stress test and biophysical profile are done to make sure the baby is not having a problem. Checking the amniotic fluid surrounding the baby is called an amniocentesis. The amniotic fluid is removed by sticking a needle into the belly (abdomen). This is sometimes done near the end of pregnancy if an early delivery is required. In this case, it is done to help make sure the baby's lungs are mature enough for the baby to live outside of the womb. If the lungs are not mature and it is unsafe to deliver the baby, an injection of cortisone medication is given to the mother 1 to 2 days before the delivery. This helps the baby's lungs mature and makes it safer to deliver the baby.  CHANGES OCCURING IN THE THIRD TRIMESTER OF PREGNANCY  Your body goes through many changes during pregnancy. They vary from person to person. Talk to your caregiver about changes you notice and are concerned about.  · During the last trimester, you have probably had an increase in your appetite. It is normal to have cravings for certain foods. This varies from person to person and pregnancy to pregnancy.  · You may begin to   get stretch marks on your hips, abdomen, and breasts. These are normal changes in the body during pregnancy. There are no exercises or medications to take which prevent this change.  · Constipation may be treated with a stool softener or adding bulk to your diet. Drinking lots of fluids, fiber in vegetables, fruits, and whole grains are helpful.  · Exercising is also helpful. If you have been very active up until your pregnancy, most of these activities can be continued during your pregnancy. If you have been less active, it is helpful to start an exercise  program such as walking. Consult your caregiver before starting exercise programs.  · Avoid all smoking, alcohol, un-prescribed drugs, herbs and "street drugs" during your pregnancy. These chemicals affect the formation and growth of the baby. Avoid chemicals throughout the pregnancy to ensure the delivery of a healthy infant.  · Backache, varicose veins and hemorrhoids may develop or get worse.  · You will tire more easily in the third trimester, which is normal.  · The baby's movements may be stronger and more often.  · You may become short of breath easily.  · Your belly button may stick out.  · A yellow discharge may leak from your breasts called colostrum.  · You may have a bloody mucus discharge. This usually occurs a few days to a week before labor begins.  HOME CARE INSTRUCTIONS   · Keep your caregiver's appointments. Follow your caregiver's instructions regarding medication use, exercise, and diet.  · During pregnancy, you are providing food for you and your baby. Continue to eat regular, well-balanced meals. Choose foods such as meat, fish, milk and other low fat dairy products, vegetables, fruits, and whole-grain breads and cereals. Your caregiver will tell you of the ideal weight gain.  · A physical sexual relationship may be continued throughout pregnancy if there are no other problems such as early (premature) leaking of amniotic fluid from the membranes, vaginal bleeding, or belly (abdominal) pain.  · Exercise regularly if there are no restrictions. Check with your caregiver if you are unsure of the safety of your exercises. Greater weight gain will occur in the last 2 trimesters of pregnancy. Exercising helps:  · Control your weight.  · Get you in shape for labor and delivery.  · You lose weight after you deliver.  · Rest a lot with legs elevated, or as needed for leg cramps or low back pain.  · Wear a good support or jogging bra for breast tenderness during pregnancy. This may help if worn during  sleep. Pads or tissues may be used in the bra if you are leaking colostrum.  · Do not use hot tubs, steam rooms, or saunas.  · Wear your seat belt when driving. This protects you and your baby if you are in an accident.  · Avoid raw meat, cat litter boxes and soil used by cats. These carry germs that can cause birth defects in the baby.  · It is easier to loose urine during pregnancy. Tightening up and strengthening the pelvic muscles will help with this problem. You can practice stopping your urination while you are going to the bathroom. These are the same muscles you need to strengthen. It is also the muscles you would use if you were trying to stop from passing gas. You can practice tightening these muscles up 10 times a set and repeating this about 3 times per day. Once you know what muscles to tighten up, do not perform these   exercises during urination. It is more likely to cause an infection by backing up the urine.  · Ask for help if you have financial, counseling or nutritional needs during pregnancy. Your caregiver will be able to offer counseling for these needs as well as refer you for other special needs.  · Make a list of emergency phone numbers and have them available.  · Plan on getting help from family or friends when you go home from the hospital.  · Make a trial run to the hospital.  · Take prenatal classes with the father to understand, practice and ask questions about the labor and delivery.  · Prepare the baby's room/nursery.  · Do not travel out of the city unless it is absolutely necessary and with the advice of your caregiver.  · Wear only low or no heal shoes to have better balance and prevent falling.  MEDICATIONS AND DRUG USE IN PREGNANCY  · Take prenatal vitamins as directed. The vitamin should contain 1 milligram of folic acid. Keep all vitamins out of reach of children. Only a couple vitamins or tablets containing iron may be fatal to a baby or young child when ingested.  · Avoid use  of all medications, including herbs, over-the-counter medications, not prescribed or suggested by your caregiver. Only take over-the-counter or prescription medicines for pain, discomfort, or fever as directed by your caregiver. Do not use aspirin, ibuprofen (Motrin®, Advil®, Nuprin®) or naproxen (Aleve®) unless OK'd by your caregiver.  · Let your caregiver also know about herbs you may be using.  · Alcohol is related to a number of birth defects. This includes fetal alcohol syndrome. All alcohol, in any form, should be avoided completely. Smoking will cause low birth rate and premature babies.  · Street/illegal drugs are very harmful to the baby. They are absolutely forbidden. A baby born to an addicted mother will be addicted at birth. The baby will go through the same withdrawal an adult does.  SEEK MEDICAL CARE IF:  You have any concerns or worries during your pregnancy. It is better to call with your questions if you feel they cannot wait, rather than worry about them.  DECISIONS ABOUT CIRCUMCISION  You may or may not know the sex of your baby. If you know your baby is a boy, it may be time to think about circumcision. Circumcision is the removal of the foreskin of the penis. This is the skin that covers the sensitive end of the penis. There is no proven medical need for this. Often this decision is made on what is popular at the time or based upon religious beliefs and social issues. You can discuss these issues with your caregiver or pediatrician.  SEEK IMMEDIATE MEDICAL CARE IF:   · An unexplained oral temperature above 102° F (38.9° C) develops, or as your caregiver suggests.  · You have leaking of fluid from the vagina (birth canal). If leaking membranes are suspected, take your temperature and tell your caregiver of this when you call.  · There is vaginal spotting, bleeding or passing clots. Tell your caregiver of the amount and how many pads are used.  · You develop a bad smelling vaginal discharge with  a change in the color from clear to white.  · You develop vomiting that lasts more than 24 hours.  · You develop chills or fever.  · You develop shortness of breath.  · You develop burning on urination.  · You loose more than 2 pounds of weight   or gain more than 2 pounds of weight or as suggested by your caregiver.  · You notice sudden swelling of your face, hands, and feet or legs.  · You develop belly (abdominal) pain. Round ligament discomfort is a common non-cancerous (benign) cause of abdominal pain in pregnancy. Your caregiver still must evaluate you.  · You develop a severe headache that does not go away.  · You develop visual problems, blurred or double vision.  · If you have not felt your baby move for more than 1 hour. If you think the baby is not moving as much as usual, eat something with sugar in it and lie down on your left side for an hour. The baby should move at least 4 to 5 times per hour. Call right away if your baby moves less than that.  · You fall, are in a car accident or any kind of trauma.  · There is mental or physical violence at home.  Document Released: 09/15/2001 Document Revised: 12/14/2011 Document Reviewed: 03/20/2009  [employer]® Patient Information ©2013 [employer].

## 2012-10-05 NOTE — L&D Delivery Note (Signed)
I have seen and examined this patient and I agree with the above. Cam Hai 9:06 AM 10/08/2012

## 2012-10-05 NOTE — L&D Delivery Note (Signed)
Delivery Note At 7:35 AM a viable and healthy female was delivered via Vaginal, Spontaneous Delivery (Presentation: Occiput Posterior).  APGAR: 8, 9; weight 6lb 1.4oz.   Placenta status: Intact, Spontaneous.  Cord: 3 vessels with the following complications: None.  Cord pH: Not indicated  Anesthesia: None  Episiotomy: None Lacerations: Labial (right labial separation, hemodynamically stable) Suture Repair: n/a Est. Blood Loss (mL):  Mom to postpartum.  Baby to nursery-stable.  Philipp Deputy, CNM was present throughout entire delivery.  Nathanel Tallman 10/08/2012, 8:00 AM

## 2012-10-06 ENCOUNTER — Encounter: Payer: Self-pay | Admitting: Obstetrics and Gynecology

## 2012-10-06 ENCOUNTER — Ambulatory Visit (INDEPENDENT_AMBULATORY_CARE_PROVIDER_SITE_OTHER): Payer: BC Managed Care – HMO | Admitting: Obstetrics and Gynecology

## 2012-10-06 VITALS — BP 124/85 | Temp 98.5°F | Wt 162.9 lb

## 2012-10-06 DIAGNOSIS — D353 Benign neoplasm of craniopharyngeal duct: Secondary | ICD-10-CM

## 2012-10-06 DIAGNOSIS — O09219 Supervision of pregnancy with history of pre-term labor, unspecified trimester: Secondary | ICD-10-CM

## 2012-10-06 DIAGNOSIS — D573 Sickle-cell trait: Secondary | ICD-10-CM

## 2012-10-06 DIAGNOSIS — D352 Benign neoplasm of pituitary gland: Secondary | ICD-10-CM

## 2012-10-06 DIAGNOSIS — O0990 Supervision of high risk pregnancy, unspecified, unspecified trimester: Secondary | ICD-10-CM

## 2012-10-06 LAB — POCT URINALYSIS DIP (DEVICE)
Glucose, UA: NEGATIVE mg/dL
Hgb urine dipstick: NEGATIVE
Nitrite: NEGATIVE
Urobilinogen, UA: 0.2 mg/dL (ref 0.0–1.0)

## 2012-10-06 NOTE — Progress Notes (Signed)
Patient doing well without complaints. FM/labor precautions reviewed. Membranes stripped. Will schedule post date testing for next week and IOL next friday

## 2012-10-06 NOTE — Progress Notes (Signed)
P=118, c/o edema in legs- states walking around to start labor,

## 2012-10-06 NOTE — Progress Notes (Signed)
BPP with AFI scheduled 10/10/12 at 11 15 am. IOL scheduled 10/14/12 at 730 am.

## 2012-10-07 ENCOUNTER — Other Ambulatory Visit: Payer: Self-pay | Admitting: Obstetrics and Gynecology

## 2012-10-07 DIAGNOSIS — O0990 Supervision of high risk pregnancy, unspecified, unspecified trimester: Secondary | ICD-10-CM

## 2012-10-08 ENCOUNTER — Inpatient Hospital Stay (HOSPITAL_COMMUNITY)
Admission: AD | Admit: 2012-10-08 | Discharge: 2012-10-09 | DRG: 373 | Disposition: A | Discharge status: Home / Self Care | Payer: BC Managed Care – HMO | Source: Ambulatory Visit | Attending: Obstetrics & Gynecology | Admitting: Obstetrics & Gynecology

## 2012-10-08 ENCOUNTER — Encounter (HOSPITAL_COMMUNITY): Payer: Self-pay | Admitting: *Deleted

## 2012-10-08 LAB — CBC
HCT: 34 % — ABNORMAL LOW (ref 36.0–46.0)
Hemoglobin: 11.5 g/dL — ABNORMAL LOW (ref 12.0–15.0)
MCH: 26.8 pg (ref 26.0–34.0)
MCV: 79.3 fL (ref 78.0–100.0)
Platelets: 133 10*3/uL — ABNORMAL LOW (ref 150–400)
RBC: 4.29 MIL/uL (ref 3.87–5.11)
WBC: 12.7 10*3/uL — ABNORMAL HIGH (ref 4.0–10.5)

## 2012-10-08 MED ORDER — ACETAMINOPHEN 325 MG PO TABS: 650.0000 mg | ORAL_TABLET | ORAL | Status: DC | PRN

## 2012-10-08 MED ORDER — WITCH HAZEL-GLYCERIN EX PADS: 1.0000 "application " | MEDICATED_PAD | CUTANEOUS | Status: DC | PRN

## 2012-10-08 MED ORDER — DIBUCAINE 1 % RE OINT: 1.0000 "application " | TOPICAL_OINTMENT | RECTAL | Status: DC | PRN

## 2012-10-08 MED ORDER — SENNOSIDES-DOCUSATE SODIUM 8.6-50 MG PO TABS: 2.0000 | ORAL_TABLET | Freq: Every day | ORAL | Status: DC

## 2012-10-08 MED ORDER — SIMETHICONE 80 MG PO CHEW: 80.0000 mg | CHEWABLE_TABLET | ORAL | Status: DC | PRN

## 2012-10-08 MED ORDER — ONDANSETRON HCL 4 MG PO TABS: 4.0000 mg | ORAL_TABLET | ORAL | Status: DC | PRN

## 2012-10-08 MED ORDER — OXYCODONE-ACETAMINOPHEN 5-325 MG PO TABS: 1.0000 | ORAL_TABLET | ORAL | Status: DC | PRN

## 2012-10-08 MED ORDER — BENZOCAINE-MENTHOL 20-0.5 % EX AERO
1.0000 "application " | INHALATION_SPRAY | CUTANEOUS | Status: DC | PRN
  Administered 2012-10-08: 1 via TOPICAL
  Filled 2012-10-08: qty 56

## 2012-10-08 MED ORDER — ONDANSETRON HCL 4 MG/2ML IJ SOLN: 4.0000 mg | Freq: Four times a day (QID) | INTRAMUSCULAR | Status: DC | PRN

## 2012-10-08 MED ORDER — PRENATAL MULTIVITAMIN CH
1.0000 | ORAL_TABLET | Freq: Every day | ORAL | Status: DC
  Administered 2012-10-08 – 2012-10-09 (×2): 1 via ORAL
  Filled 2012-10-08 (×2): qty 1

## 2012-10-08 MED ORDER — LACTATED RINGERS IV SOLN
INTRAVENOUS | Status: DC
  Administered 2012-10-08: 07:00:00 via INTRAVENOUS

## 2012-10-08 MED ORDER — IBUPROFEN 600 MG PO TABS: 600.0000 mg | ORAL_TABLET | Freq: Four times a day (QID) | ORAL | Status: DC | PRN

## 2012-10-08 MED ORDER — SODIUM CHLORIDE 0.9 % IJ SOLN: 3.0000 mL | Freq: Two times a day (BID) | INTRAMUSCULAR | Status: DC

## 2012-10-08 MED ORDER — CITRIC ACID-SODIUM CITRATE 334-500 MG/5ML PO SOLN: 30.0000 mL | ORAL | Status: DC | PRN

## 2012-10-08 MED ORDER — IBUPROFEN 600 MG PO TABS
600.0000 mg | ORAL_TABLET | Freq: Four times a day (QID) | ORAL | Status: DC
  Administered 2012-10-08 – 2012-10-09 (×5): 600 mg via ORAL
  Filled 2012-10-08 (×5): qty 1

## 2012-10-08 MED ORDER — OXYTOCIN BOLUS FROM INFUSION
500.0000 mL | INTRAVENOUS | Status: DC
Start: 2012-10-08 — End: 2012-10-08
  Administered 2012-10-08: 500 mL via INTRAVENOUS

## 2012-10-08 MED ORDER — ZOLPIDEM TARTRATE 5 MG PO TABS: 5.0000 mg | ORAL_TABLET | Freq: Every evening | ORAL | Status: DC | PRN

## 2012-10-08 MED ORDER — LANOLIN HYDROUS EX OINT: TOPICAL_OINTMENT | CUTANEOUS | Status: DC | PRN

## 2012-10-08 MED ORDER — ONDANSETRON HCL 4 MG/2ML IJ SOLN: 4.0000 mg | INTRAMUSCULAR | Status: DC | PRN

## 2012-10-08 MED ORDER — DIPHENHYDRAMINE HCL 25 MG PO CAPS: 25.0000 mg | ORAL_CAPSULE | Freq: Four times a day (QID) | ORAL | Status: DC | PRN

## 2012-10-08 MED ORDER — LIDOCAINE HCL (PF) 1 % IJ SOLN
30.0000 mL | INTRAMUSCULAR | Status: DC | PRN
  Filled 2012-10-08: qty 30

## 2012-10-08 MED ORDER — OXYTOCIN 40 UNITS IN LACTATED RINGERS INFUSION - SIMPLE MED
62.5000 mL/h | INTRAVENOUS | Status: DC
  Filled 2012-10-08: qty 1000

## 2012-10-08 MED ORDER — TETANUS-DIPHTH-ACELL PERTUSSIS 5-2.5-18.5 LF-MCG/0.5 IM SUSP
0.5000 mL | Freq: Once | INTRAMUSCULAR | Status: AC
  Administered 2012-10-09: 0.5 mL via INTRAMUSCULAR
  Filled 2012-10-08: qty 0.5

## 2012-10-08 MED ORDER — NALBUPHINE SYRINGE 5 MG/0.5 ML: 10.0000 mg | INJECTION | INTRAMUSCULAR | Status: DC | PRN

## 2012-10-08 NOTE — H&P (Signed)
Sarah Prince is a 27 y.o. female G2P0101 at 58.1wks presenting for active labor. Denies leak or bldg.  Maternal Medical History:  Reason for admission: Reason for admission: contractions.    OB History    Grav Para Term Preterm Abortions TAB SAB Ect Mult Living   2 1  1      1      Past Medical History  Diagnosis Date  . Prolactinoma   . Preterm delivery    Past Surgical History  Procedure Date  . No past surgeries    Family History: family history includes Diabetes in her mother. Social History:  reports that she has never smoked. She has never used smokeless tobacco. She reports that she does not drink alcohol or use illicit drugs.   Prenatal Transfer Tool  Maternal Diabetes: No Genetic Screening: Normal Maternal Ultrasounds/Referrals: Normal Fetal Ultrasounds or other Referrals:  None Maternal Substance Abuse:  No Significant Maternal Medications:  17-p Significant Maternal Lab Results:  None Other Comments:  Followed at high risk for history of preterm labor and prolactinoma   ROS Positive for contractions, back pain. Negative for HA, edema.  Dilation: 10 Effacement (%): 100 Station: +1 Exam by:: L.Mears,Rn Blood pressure 153/93, pulse 109, temperature 98.7 F (37.1 C), temperature source Oral, resp. rate 18, height 5\' 4"  (1.626 m), weight 74.208 kg (163 lb 9.6 oz), last menstrual period 01/01/2012. Maternal Exam:  Uterine Assessment: Contraction strength is firm.    Physical Exam  Constitutional: She is oriented to person, place, and time. She appears well-developed and well-nourished. She appears distressed.  HENT:  Head: Normocephalic and atraumatic.  Cardiovascular: Normal rate and regular rhythm.   Respiratory: Effort normal and breath sounds normal.  GI:       Gravid, toco in place  Musculoskeletal: She exhibits edema.  Neurological: She is alert and oriented to person, place, and time.  Skin: Skin is warm and dry.    Prenatal labs: ABO, Rh: O/POS/--  (05/23 1341) Antibody: NEG (05/23 1341) Rubella: 198.9 (05/23 1341) RPR: NON REAC (10/07 1005)  HBsAg: NEGATIVE (05/23 1341)  HIV: NON REACTIVE (10/07 1005)  GBS: Negative (12/09 0000)   Assessment/Plan: 27 yo G2 P0101 at 40.1 presenting in active labor - Admit to L&D - GBS negative - Expectant management - Anticipate SVD  HAIRFORD, AMBER 10/08/2012, 7:54 AM  I have seen and examined this patient and I agree with the above. Cam Hai 9:06 AM 10/08/2012

## 2012-10-08 NOTE — MAU Note (Signed)
Contractions every 5 minutes since 0200 this morning. Denies leaking of fluid or vaginal bleeding. Was dilated 3cm last week in office.

## 2012-10-08 NOTE — H&P (Signed)
Attestation of Attending Supervision of Advanced Practitioner (PA/CNM/NP): Evaluation and management procedures were performed by the Advanced Practitioner under my supervision and collaboration.  I have reviewed the Advanced Practitioner's note and chart, and I agree with the management and plan.  Tawan Degroote, MD, FACOG Attending Obstetrician & Gynecologist Faculty Practice, [facility]   

## 2012-10-09 LAB — CBC
HCT: 29.5 % — ABNORMAL LOW (ref 36.0–46.0)
MCHC: 33.2 g/dL (ref 30.0–36.0)
RDW: 14.6 % (ref 11.5–15.5)

## 2012-10-09 MED ORDER — IBUPROFEN 600 MG PO TABS: 600.0000 mg | ORAL_TABLET | Freq: Four times a day (QID) | ORAL | Status: DC

## 2012-10-09 NOTE — Progress Notes (Signed)
Post Partum Day 1 Subjective: no complaints, up ad lib, voiding, tolerating PO and + flatus Offered early d/c- undecided at this time, will let RN know if decides to go Objective: Blood pressure 112/70, pulse 96, temperature 99.7 F (37.6 C), temperature source Oral, resp. rate 18, height 5\' 4"  (1.626 m), weight 74.208 kg (163 lb 9.6 oz), last menstrual period 01/01/2012, SpO2 100.00%, unknown if currently breastfeeding.  Physical Exam:  General: alert, cooperative and no distress Lochia: appropriate Uterine Fundus: firm Incision: n/a DVT Evaluation: No evidence of DVT seen on physical exam. Negative Homan's sign. No cords or calf tenderness. No significant calf/ankle edema.   Basename 10/09/12 0519 10/08/12 0646  HGB 9.8* 11.5*  HCT 29.5* 34.0*    Assessment/Plan: Plan for discharge tomorrow, Breastfeeding, Lactation consult and Contraception undecided   LOS: 1 day   Marge Duncans 10/09/2012, 7:44 AM

## 2012-10-09 NOTE — Discharge Summary (Signed)
Obstetric Discharge Summary Reason for Admission: onset of labor Prenatal Procedures: NST, [place] Intrapartum Procedures: spontaneous vaginal delivery Postpartum Procedures: none Complications-Operative and Postpartum: none Hemoglobin  Date Value Range Status  10/09/2012 9.8* 12.0 - 15.0 g/dL Final     HCT  Date Value Range Status  10/09/2012 29.5* 36.0 - 46.0 % Final    Physical Exam:  General: alert, cooperative and no distress Lochia: appropriate Uterine Fundus: firm Incision: NA DVT Evaluation: No cords or calf tenderness.  Discharge Diagnoses: Term Pregnancy-delivered  Discharge Information: Date: 10/09/2012 Activity: pelvic rest Diet: routine and increase dietary iron Medications: PNV, Ibuprofen and Iron Condition: stable Instructions: refer to practice specific booklet Discharge to: home Follow-up Information    [facility] In 6 weeks.   Contact information:   [address] [place] [address]       Follow up with [facility] [place] [facility]. (for emergencies)    Contact information:   [address] [id number] mc [place] [address] [phone]         Newborn Data: Live born female  Birth Weight: 6 lb 1.4 oz (2761 g) APGAR: 8, 9  Home with mother. Breastfeeding BC: Undecided.   Dorathy Kinsman 10/09/2012, 5:37 PM

## 2012-10-10 ENCOUNTER — Ambulatory Visit (HOSPITAL_COMMUNITY): Admission: RE | Admit: 2012-10-10 | Payer: 59 | Source: Ambulatory Visit

## 2012-10-13 ENCOUNTER — Other Ambulatory Visit: Payer: BC Managed Care – HMO

## 2012-10-14 ENCOUNTER — Inpatient Hospital Stay (HOSPITAL_COMMUNITY): Admission: RE | Admit: 2012-10-14 | Payer: BC Managed Care – HMO | Source: Ambulatory Visit

## 2012-10-14 ENCOUNTER — Encounter: Payer: Self-pay | Admitting: Family Medicine

## 2012-10-14 NOTE — Progress Notes (Signed)
Patient dropped on FMLA paperwork for postpartum leave of absence. Called patient to clarify what she needed, but she was present for weight check. She is requesting paperwork for 8 weeks off, which is appropriate. Paperwork completed with estimated return date of December 03, 2012. Paperwork returned to patient.  Elwyn Klosinski M. Abdelrahman Nair, M.D. 10/14/2012 9:17 AM

## 2012-11-14 ENCOUNTER — Telehealth (HOSPITAL_COMMUNITY): Payer: Self-pay | Admitting: *Deleted

## 2012-11-19 ENCOUNTER — Other Ambulatory Visit: Payer: Self-pay

## 2012-11-22 ENCOUNTER — Ambulatory Visit (INDEPENDENT_AMBULATORY_CARE_PROVIDER_SITE_OTHER): Payer: BC Managed Care – HMO | Admitting: Family Medicine

## 2012-11-22 ENCOUNTER — Encounter: Payer: Self-pay | Admitting: Family Medicine

## 2012-11-22 VITALS — BP 124/80 | HR 97 | Temp 98.2°F | Ht 64.0 in | Wt 147.6 lb

## 2012-11-22 DIAGNOSIS — R42 Dizziness and giddiness: Secondary | ICD-10-CM

## 2012-11-22 DIAGNOSIS — R112 Nausea with vomiting, unspecified: Secondary | ICD-10-CM

## 2012-11-22 LAB — GLUCOSE, CAPILLARY: Glucose-Capillary: 94 mg/dL (ref 70–99)

## 2012-11-22 MED ORDER — ONDANSETRON HCL 4 MG PO TABS: 4.0000 mg | ORAL_TABLET | Freq: Three times a day (TID) | ORAL | Status: DC | PRN

## 2012-11-22 MED ORDER — NORETHINDRONE 0.35 MG PO TABS: 1.0000 | ORAL_TABLET | Freq: Every day | ORAL | Status: DC

## 2012-11-22 MED ORDER — PROMETHAZINE HCL 25 MG/ML IJ SOLN
25.0000 mg | Freq: Once | INTRAMUSCULAR | Status: AC
  Administered 2012-11-22: 25 mg via INTRAMUSCULAR

## 2012-11-22 MED ORDER — MECLIZINE HCL 32 MG PO TABS: 32.0000 mg | ORAL_TABLET | Freq: Three times a day (TID) | ORAL | Status: DC | PRN

## 2012-11-22 NOTE — Progress Notes (Signed)
  Subjective:     Sarah Prince is a 27 y.o. female who presents for a postpartum visit. She is 6 weeks postpartum following a spontaneous vaginal delivery. I have fully reviewed the prenatal and intrapartum course. The delivery was at 40.1 gestational weeks. Outcome: spontaneous vaginal delivery. Anesthesia: none. Postpartum course has been good except for vaginal pain. Baby's course has been unremarkable. Baby is feeding by both breast and bottle - Gerber Gentle start at night. Bleeding no bleeding. Bowel function is normal. Bladder function is normal. Patient is not sexually active. Contraception method is none and interested in OCP. Postpartum depression screening: negative.  Patient also experiencing vertigo symptoms, which started rather acutely. She states she has not felt well for the last 3 days, but today in clinic she is experiencing extreme light-headedness and nausea. She vomited x3 in clinic.  The following portions of the patient's history were reviewed and updated as appropriate: allergies, current medications, past family history, past medical history, past social history, past surgical history and problem list.  Review of Systems Pertinent items are noted in HPI.   Objective:    BP 124/80  Pulse 97  Temp(Src) 98.2 F (36.8 C) (Oral)  Ht 5\' 4"  (1.626 m)  Wt 147 lb 9.6 oz (66.951 kg)  BMI 25.32 kg/m2  LMP 12/04/2011  Breastfeeding? Yes  General:  alert and cooperative   Breasts:  inspection negative, no nipple discharge or bleeding, no masses or nodularity palpable  Lungs: clear to auscultation bilaterally  Heart:  regular rate and rhythm, S1, S2 normal, no murmur, click, rub or gallop  Abdomen: soft, non-tender; bowel sounds normal; no masses,  no organomegaly   Vulva:  normal  Vagina: normal vagina, no discharge, exudate, lesion, or erythema  Cervix:  no cervical motion tenderness  Corpus: normal  Adnexa:  normal adnexa  Rectal Exam: Not performed.        Assessment:      Normal postpartum exam. Pap smear not done at today's visit.   Plan:    1. Contraception: OCP (estrogen/progesterone) 2. Vertigo- Patient developed vertigo in the clinic. Given Phenergan, and her husband came to pick her up. She had normal CBG and HgB today. She was offered coke and ice, which precipitated her vomiting. No focal neurological findings, and able to walk with assistance. RTC in 2 days if not improved. Given Zofran for nausea and Meclizine for vomiting. If she worsens or has any neurological findings, she should go tot ED for evaluation. Pt and husband agree with plan. 3. Follow up in: 2 days or as needed.

## 2012-11-22 NOTE — Patient Instructions (Addendum)
It was good to see you today. I have sent a prescription for your OCP. Please come back as needed, or if you would like to discuss other options for contraception.   Jessika Rothery M. Tonita Bills, M.D.   Since you are not feeling well, please rest at home and drink fluids, if you can. If you develop any different symptoms including numbness, tingling, weakness, headache not improved with Tylenol, please come back here or go to the Emergency Room.   I have sent in a prescription for Zofran for your nausea and Meclizine for your dizziness. Please be aware this may make you sleepy, so please have another caregiver for your baby.  Please come back to to clinic in 2 days if you are not feeling better.

## 2012-11-23 ENCOUNTER — Telehealth: Payer: Self-pay | Admitting: Family Medicine

## 2012-11-23 NOTE — Telephone Encounter (Signed)
Called pt to check on her since she was having dizziness and nausea yesterday when she left clinic. Pt was not available. Left VM to call blue team with an update on how she is doing.  Sarah Prince M. Sarah Prince, M.D.

## 2012-11-23 NOTE — Telephone Encounter (Signed)
Will you please print her a note for her. I think she had FMLA extending until March. She may return whenever her FLMA is up (March 1, I think.)  Thank you! Sarah Prince, M.D.

## 2012-11-23 NOTE — Telephone Encounter (Signed)
Letter printed stating she may return 12/05/2012 per Dr Mikel Cella. LMOM advising pt letter up front.

## 2012-11-23 NOTE — Telephone Encounter (Signed)
Pt states she is feeling better, picked up zofran but they did not have the meclizine order. Took Advil today and feels better, will sched F/u as she needs a RTW note from you.

## 2012-12-01 ENCOUNTER — Telehealth: Payer: Self-pay | Admitting: Family Medicine

## 2012-12-01 ENCOUNTER — Encounter: Payer: Self-pay | Admitting: Family Medicine

## 2012-12-01 NOTE — Telephone Encounter (Signed)
You may print return to work note extending time to 12/12/12. She will need to handle this extension with her job as far as her FMLA goes since her original paperwork stated 12/05/12. I will only be able to provide a return to work note, not new FLMA paperwork.   Thanks! Amber M. Hairford, M.D.

## 2012-12-01 NOTE — Telephone Encounter (Signed)
Will fwd to provider who saw patient.

## 2012-12-01 NOTE — Telephone Encounter (Signed)
Pt is asking to be able to go back to work on 3/10 not 3/3 - needs another letter. pls advise

## 2012-12-01 NOTE — Telephone Encounter (Signed)
LMOM stating that we will do letter for 12/12/2012 but that FMLA papers state 12/05/2012.

## 2012-12-01 NOTE — Telephone Encounter (Signed)
Will forward to Dr. Briscoe. 

## 2013-04-11 ENCOUNTER — Encounter: Payer: Self-pay | Admitting: Family Medicine

## 2013-04-11 ENCOUNTER — Ambulatory Visit (INDEPENDENT_AMBULATORY_CARE_PROVIDER_SITE_OTHER): Payer: BC Managed Care – HMO | Admitting: Family Medicine

## 2013-04-11 VITALS — BP 114/70 | HR 78 | Temp 98.2°F | Ht 64.0 in | Wt 154.2 lb

## 2013-04-11 DIAGNOSIS — A499 Bacterial infection, unspecified: Secondary | ICD-10-CM

## 2013-04-11 DIAGNOSIS — B373 Candidiasis of vulva and vagina: Secondary | ICD-10-CM

## 2013-04-11 DIAGNOSIS — N760 Acute vaginitis: Secondary | ICD-10-CM

## 2013-04-11 DIAGNOSIS — B9689 Other specified bacterial agents as the cause of diseases classified elsewhere: Secondary | ICD-10-CM | POA: Insufficient documentation

## 2013-04-11 DIAGNOSIS — N898 Other specified noninflammatory disorders of vagina: Secondary | ICD-10-CM

## 2013-04-11 MED ORDER — METRONIDAZOLE 500 MG PO TABS
500.0000 mg | ORAL_TABLET | Freq: Two times a day (BID) | ORAL | Status: DC
Start: 2013-04-11 — End: 2014-09-21

## 2013-04-11 MED ORDER — FLUCONAZOLE 150 MG PO TABS: 150.0000 mg | ORAL_TABLET | Freq: Once | ORAL | Status: DC

## 2013-04-11 NOTE — Progress Notes (Addendum)
Family Medicine Office Visit Note   Subjective:   Patient ID: Sarah Prince, female  DOB: May 27, 1986, 27 y.o.. MRN: [id number]   Pt that comes today complaining of vaginal discharge. She reports having this since pregnancy, mostly yeast. Her first LMP after delivery was few weeks ago which improved her vaginal discharge slightly but now it has come back. Vaginal discharge is described as white thick non malodorous. Denies pruritus, pelvic or abdominal pain, or other symptoms.  Used condom with regularity. Last sexual intercourse was three days ago.  Review of Systems:  Per HPI  Objective:   Physical Exam: Gen:  NAD ABD: Soft, non tender, non distended, normal bowel sounds Vulva and perianal area: Normal  Speculum: Vagina and cervix of normal appearance, no friability, white curd -like vaginal discharge. Bimanual exam: Uterus anteverted, no adnexal masses. No cervical motion tenderness.  Assessment & Plan:

## 2013-04-11 NOTE — Patient Instructions (Addendum)
Yeast  Vaginitis Vaginitis in a soreness, swelling and redness (inflammation) of the vagina and vulva. Monilial vaginitis is not a sexually transmitted infection. CAUSES  Yeast vaginitis is caused by yeast (candida) that is normally found in your vagina. With a yeast infection, the candida has overgrown in number to a point that upsets the chemical balance. SYMPTOMS   White, thick vaginal discharge.  Swelling, itching, redness and irritation of the vagina and possibly the lips of the vagina (vulva).  Burning or painful urination.  Painful intercourse. DIAGNOSIS  Things that may contribute to monilial vaginitis are:  Postmenopausal and virginal states.  Pregnancy.  Infections.  Being tired, sick or stressed, especially if you had monilial vaginitis in the past.  Diabetes. Good control will help lower the chance.  Birth control pills.  Tight fitting garments.  Using bubble bath, feminine sprays, douches or deodorant tampons.  Taking certain medications that kill germs (antibiotics).  Sporadic recurrence can occur if you become ill. TREATMENT  Your caregiver will give you medication.  There are several kinds of anti monilial vaginal creams and suppositories specific for monilial vaginitis. For recurrent yeast infections, use a suppository or cream in the vagina 2 times a week, or as directed.  Anti-monilial or steroid cream for the itching or irritation of the vulva may also be used. Get your caregiver's permission.  Painting the vagina with methylene blue solution may help if the monilial cream does not work.  Eating yogurt may help prevent monilial vaginitis. HOME CARE INSTRUCTIONS   Finish all medication as prescribed.  Do not have sex until treatment is completed or after your caregiver tells you it is okay.  Take warm sitz baths.  Do not douche.  Do not use tampons, especially scented ones.  Wear cotton underwear.  Avoid tight pants and panty hose.  Tell  your sexual partner that you have a yeast infection. They should go to their caregiver if they have symptoms such as mild rash or itching.  Your sexual partner should be treated as well if your infection is difficult to eliminate.  Practice safer sex. Use condoms.  Some vaginal medications cause latex condoms to fail. Vaginal medications that harm condoms are:  Cleocin cream.  Butoconazole (Femstat).  Terconazole (Terazol) vaginal suppository.  Miconazole (Monistat) (may be purchased over the counter). SEEK MEDICAL CARE IF:   You have a temperature by mouth above 102 F (38.9 C).  The infection is getting worse after 2 days of treatment.  The infection is not getting better after 3 days of treatment.  You develop blisters in or around your vagina.  You develop vaginal bleeding, and it is not your menstrual period.  You have pain when you urinate.  You develop intestinal problems.  You have pain with sexual intercourse. Document Released: 07/01/2005 Document Revised: 12/14/2011 Document Reviewed: 03/15/2009 [facility] Patient Information 2014 [place], [place].  Bacterial Vaginosis Bacterial vaginosis (BV) is a vaginal infection where the normal balance of bacteria in the vagina is disrupted. The normal balance is then replaced by an overgrowth of certain bacteria. There are several different kinds of bacteria that can cause BV. BV is the most common vaginal infection in women of childbearing age. CAUSES   The cause of BV is not fully understood. BV develops when there is an increase or imbalance of harmful bacteria.  Some activities or behaviors can upset the normal balance of bacteria in the vagina and put women at increased risk including:  Having a new sex partner  or multiple sex partners.  Douching.  Using an intrauterine device (IUD) for contraception.  It is not clear what role sexual activity plays in the development of BV. However, women that have never had  sexual intercourse are rarely infected with BV. Women do not get BV from toilet seats, bedding, swimming pools or from touching objects around them.  SYMPTOMS   Grey vaginal discharge.  A fish-like odor with discharge, especially after sexual intercourse.  Itching or burning of the vagina and vulva.  Burning or pain with urination.  Some women have no signs or symptoms at all. DIAGNOSIS  Your caregiver must examine the vagina for signs of BV. Your caregiver will perform lab tests and look at the sample of vaginal fluid through a microscope. They will look for bacteria and abnormal cells (clue cells), a pH test higher than 4.5, and a positive amine test all associated with BV.  RISKS AND COMPLICATIONS   Pelvic inflammatory disease (PID).  Infections following gynecology surgery.  Developing HIV.  Developing herpes virus. TREATMENT  Sometimes BV will clear up without treatment. However, all women with symptoms of BV should be treated to avoid complications, especially if gynecology surgery is planned. Female partners generally do not need to be treated. However, BV may spread between female sex partners so treatment is helpful in preventing a recurrence of BV.   BV may be treated with antibiotics. The antibiotics come in either pill or vaginal cream forms. Either can be used with nonpregnant or pregnant women, but the recommended dosages differ. These antibiotics are not harmful to the baby.  BV can recur after treatment. If this happens, a second round of antibiotics will often be prescribed.  Treatment is important for pregnant women. If not treated, BV can cause a premature delivery, especially for a pregnant woman who had a premature birth in the past. All pregnant women who have symptoms of BV should be checked and treated.  For chronic reoccurrence of BV, treatment with a type of prescribed gel vaginally twice a week is helpful. HOME CARE INSTRUCTIONS   Finish all medication as  directed by your caregiver.  Do not have sex until treatment is completed.  Tell your sexual partner that you have a vaginal infection. They should see their caregiver and be treated if they have problems, such as a mild rash or itching.  Practice safe sex. Use condoms. Only have 1 sex partner. PREVENTION  Basic prevention steps can help reduce the risk of upsetting the natural balance of bacteria in the vagina and developing BV:  Do not have sexual intercourse (be abstinent).  Do not douche.  Use all of the medicine prescribed for treatment of BV, even if the signs and symptoms go away.  Tell your sex partner if you have BV. That way, they can be treated, if needed, to prevent reoccurrence. SEEK MEDICAL CARE IF:   Your symptoms are not improving after 3 days of treatment.  You have increased discharge, pain, or fever. MAKE SURE YOU:   Understand these instructions.  Will watch your condition.  Will get help right away if you are not doing well or get worse. FOR MORE INFORMATION  Division of STD Prevention (DSTDP), Centers for Disease Control and Prevention: [employer]s.[employer] American Social Health Association (ASHA): www.ashastd.org  Document Released: 09/21/2005 Document Revised: 12/14/2011 Document Reviewed: 03/14/2009 [facility] Patient Information 2014 [place], [place].

## 2013-04-11 NOTE — Assessment & Plan Note (Signed)
Fluconazole x1 and Metronidazole 500mg  BID for 7 days Education F/u as needed

## 2013-08-10 ENCOUNTER — Other Ambulatory Visit: Payer: Self-pay

## 2013-11-03 ENCOUNTER — Ambulatory Visit (INDEPENDENT_AMBULATORY_CARE_PROVIDER_SITE_OTHER): Payer: BC Managed Care – HMO | Admitting: *Deleted

## 2013-11-03 DIAGNOSIS — Z23 Encounter for immunization: Secondary | ICD-10-CM

## 2014-08-06 ENCOUNTER — Encounter: Payer: Self-pay | Admitting: Family Medicine

## 2014-09-21 ENCOUNTER — Other Ambulatory Visit (HOSPITAL_COMMUNITY)
Admission: RE | Admit: 2014-09-21 | Discharge: 2014-09-21 | Disposition: A | Discharge status: Home / Self Care | Payer: BC Managed Care – HMO | Source: Ambulatory Visit | Attending: Family Medicine | Admitting: Family Medicine

## 2014-09-21 ENCOUNTER — Ambulatory Visit (INDEPENDENT_AMBULATORY_CARE_PROVIDER_SITE_OTHER): Payer: BC Managed Care – HMO | Admitting: *Deleted

## 2014-09-21 ENCOUNTER — Encounter: Payer: Self-pay | Admitting: Family Medicine

## 2014-09-21 ENCOUNTER — Ambulatory Visit (INDEPENDENT_AMBULATORY_CARE_PROVIDER_SITE_OTHER): Payer: BC Managed Care – HMO | Admitting: Family Medicine

## 2014-09-21 VITALS — BP 120/72 | HR 92 | Temp 98.3°F | Ht 64.0 in | Wt 139.0 lb

## 2014-09-21 DIAGNOSIS — N898 Other specified noninflammatory disorders of vagina: Secondary | ICD-10-CM | POA: Diagnosis not present

## 2014-09-21 DIAGNOSIS — Z3169 Encounter for other general counseling and advice on procreation: Secondary | ICD-10-CM | POA: Diagnosis not present

## 2014-09-21 DIAGNOSIS — Z113 Encounter for screening for infections with a predominantly sexual mode of transmission: Secondary | ICD-10-CM | POA: Diagnosis not present

## 2014-09-21 DIAGNOSIS — Z23 Encounter for immunization: Secondary | ICD-10-CM | POA: Diagnosis not present

## 2014-09-21 DIAGNOSIS — N760 Acute vaginitis: Secondary | ICD-10-CM | POA: Insufficient documentation

## 2014-09-21 LAB — POCT WET PREP (WET MOUNT)
Clue Cells Wet Prep Whiff POC: NEGATIVE
WBC, Wet Prep HPF POC: >20

## 2014-09-21 MED ORDER — FLUCONAZOLE 150 MG PO TABS: 150.0000 mg | ORAL_TABLET | Freq: Once | ORAL | Status: DC

## 2014-09-21 NOTE — Assessment & Plan Note (Signed)
++   yeast on wet prep. Diflucan prescribed. Patient aware to pick up prescription the the pharmacy.

## 2014-09-21 NOTE — Patient Instructions (Signed)
Preparing for Pregnancy Before trying to become pregnant, make an appointment with your health care provider (preconception care). The goal is to help you have a healthy, safe pregnancy. At your first appointment, your health care provider will:   Do a complete physical exam, including a Pap test.  Take a complete medical history.  Give you advice and help you resolve any problems. PRECONCEPTION CHECKLIST Here is a list of the basics to cover with your health care provider at your preconception visit:  Medical history.  Tell your health care provider about any diseases you have had. Many diseases can affect your pregnancy.  Include your partner's medical history and family history.  Make sure you have been tested for sexually transmitted infections (STIs). These can affect your pregnancy. In some cases, they can be passed to your baby. Tell your health care provider about any history of STIs.  Make sure your health care provider knows about any previous problems you have had with conception or pregnancy.  Tell your health care provider about any medicine you take. This includes herbal supplements and over-the-counter medicines.  Make sure all your immunizations are up to date. You may need to make additional appointments.  Ask your health care provider if you need any vaccinations or if there are any you should avoid.  Diet.  It is especially important to eat a healthy, balanced diet with the right nutrients when you are pregnant.  Ask your health care provider to help you get to a healthy weight before pregnancy.  If you are overweight, you are at higher risk for certain complications. These include high blood pressure, diabetes, and preterm birth.  If you are underweight, you are more likely to have a low-birth-weight baby.  Lifestyle.  Tell your health care provider about lifestyle factors such as alcohol use, drug use, or smoking.  Describe any harmful substances you may  be exposed to at work or home. These can include chemicals, pesticides, and radiation.  Mental health.  Let your health care provider know if you have been feeling depressed or anxious.  Let your health care provider know if you have a history of substance abuse.  Let your health care provider know if you do not feel safe at home. HOME INSTRUCTIONS TO PREPARE FOR PREGNANCY Follow your health care provider's advice and instructions.   Keep an accurate record of your menstrual periods. This makes it easier for your health care provider to determine your baby's due date.  Begin taking prenatal vitamins and folic acid supplements daily. Take them as directed by your health care provider.  Eat a balanced diet. Get help from a nutrition counselor if you have questions or need help.  Get regular exercise. Try to be active for at least 30 minutes a day most days of the week.  Quit smoking, if you smoke.  Do not drink alcohol.  Do not take illegal drugs.  Get medical problems, such as diabetes or high blood pressure, under control.  If you have diabetes, make sure you do the following:  Have good blood sugar control. If you have type 1 diabetes, use multiple daily doses of insulin. Do not use split-dose or premixed insulin.  Have an eye exam by a qualified eye care professional trained in caring for people with diabetes.  Get evaluated by your health care provider for cardiovascular disease.  Get to a healthy weight. If you are overweight or obese, reduce your weight with the help of a qualified health   professional such as a Firefighter. Ask your health care provider what the right weight range is for you. HOW DO I KNOW I AM PREGNANT? You may be pregnant if you have been sexually active and you miss your period. Symptoms of early pregnancy include:   Mild cramping.  Very light vaginal bleeding (spotting).  Feeling unusually tired.  Morning sickness. If you have any of  these symptoms, take a home pregnancy test. These tests look for a hormone called human chorionic gonadotropin (hCG) in your urine. Your body begins to make this hormone during early pregnancy. These tests are very accurate. Wait until at least the first day you miss your period to take one. If you get a positive result, call your health care provider to make appointments for prenatal care. WHAT SHOULD I DO IF I BECOME PREGNANT?  Make an appointment with your health care provider by week 12 of your pregnancy at the latest.  Do not smoke. Smoking can be harmful to your baby.  Do not drink alcoholic beverages. Alcohol is related to a number of birth defects.  Avoid toxic odors and chemicals.  You may continue to have sexual intercourse if it does not cause pain or other problems, such as vaginal bleeding. Document Released: 09/03/2008 Document Revised: 02/05/2014 Document Reviewed: 08/28/2013 [facility] Patient Information 2015 [place], [place]. This information is not intended to replace advice given to you by your health care provider. Make sure you discuss any questions you have with your health care provider.

## 2014-09-21 NOTE — Assessment & Plan Note (Signed)
Counseling done on conception. Continue MVI. Might not be getting pregnancy due to hormonal deficiency (pituitary adenoma) associated with irregular menses. I recommended consultation with OB/GYN for further management.

## 2014-09-21 NOTE — Progress Notes (Signed)
Subjective:     Patient ID: Sarah Prince, female   DOB: Feb 21, 1986, 28 y.o.   MRN: [id number]  HPI  Vaginal discharge:C/O vaginal discharge for 4 months on and off, worsens after period. LMP was Nov 28th, discharge is whitish in color or milky, sometimes smelling, denies any blood in her discharge, sexually active with same partner for 9 yrs.  Pregnancy concern: patient will like to get pregnant in the nearest future and will like to know if she will be high risk for pregnancy. She has hx of pituitary adenoma, she had preterm delivery of her first baby and second baby she had to get 17p weekly till she had the baby. She has not been using any birth control for a while and she is not getting pregnant. Her menstrual period is quite irregular. She has been taking prenatal vitamin in case she gets pregnant.  No current outpatient prescriptions on file prior to visit.   No current facility-administered medications on file prior to visit.   Past Medical History  Diagnosis Date  . Prolactinoma   . Preterm delivery     Family History  Problem Relation Age of Onset  . Diabetes Mother     Review of Systems  Respiratory: Negative.   Cardiovascular: Negative.   Gastrointestinal: Negative.   Genitourinary: Positive for vaginal discharge and menstrual problem. Negative for urgency and vaginal bleeding.       Irregular menstruation.  All other systems reviewed and are negative.  Filed Vitals:   09/21/14 0949  BP: 120/72  Pulse: 92  Temp: 98.3 F (36.8 C)  TempSrc: Oral  Height: 5\' 4"  (1.626 m)  Weight: 139 lb (63.05 kg)       Objective:   Physical Exam  Constitutional: She appears well-developed and well-nourished. No distress.  Cardiovascular: Normal rate, regular rhythm and normal heart sounds.   No murmur heard. Pulmonary/Chest: Effort normal and breath sounds normal. No respiratory distress. She has no wheezes.  Abdominal: Soft. Bowel sounds are normal. She exhibits no distension and  no mass. There is no tenderness.  Genitourinary: Cervix exhibits discharge. Cervix exhibits no motion tenderness. Vaginal discharge found.    Musculoskeletal: Normal range of motion.  Nursing note and vitals reviewed.      Assessment:     Vaginitis: ++ yeast. Preconception counseling     Plan:     Check problem list. Total time spent on this face to face encounter, counseling and coordination of care was more than 30 min.

## 2014-09-24 LAB — CERVICOVAGINAL ANCILLARY ONLY
CHLAMYDIA, DNA PROBE: NEGATIVE
Neisseria Gonorrhea: NEGATIVE

## 2014-11-09 ENCOUNTER — Ambulatory Visit (INDEPENDENT_AMBULATORY_CARE_PROVIDER_SITE_OTHER): Payer: BLUE CROSS/BLUE SHIELD | Admitting: Family Medicine

## 2014-11-09 ENCOUNTER — Encounter: Payer: Self-pay | Admitting: Family Medicine

## 2014-11-09 VITALS — BP 116/77 | HR 73 | Temp 98.6°F | Ht 64.0 in | Wt 143.7 lb

## 2014-11-09 DIAGNOSIS — N760 Acute vaginitis: Secondary | ICD-10-CM

## 2014-11-09 DIAGNOSIS — L918 Other hypertrophic disorders of the skin: Secondary | ICD-10-CM

## 2014-11-09 DIAGNOSIS — N898 Other specified noninflammatory disorders of vagina: Secondary | ICD-10-CM

## 2014-11-09 LAB — POCT WET PREP (WET MOUNT): CLUE CELLS WET PREP WHIFF POC: NEGATIVE

## 2014-11-09 MED ORDER — FLUCONAZOLE 150 MG PO TABS: 150.0000 mg | ORAL_TABLET | ORAL | Status: AC

## 2014-11-09 NOTE — Progress Notes (Signed)
Subjective:     Patient ID: Sarah Prince, female   DOB: Jun 18, 1986, 29 y.o.   MRN: [id number]  HPI  VD:  Ongoing for 3 wks, whitish in color, no blood,no vaginal itching, has some odor. Sexually active with partner of 8 yrs. Skin lesion: Back to neck area since 2.5 yrs ago,denies itching, no pain, not worsening.   No current outpatient prescriptions on file prior to visit.   No current facility-administered medications on file prior to visit.   Past Medical History  Diagnosis Date  . Prolactinoma   . Preterm delivery       Review of Systems  Respiratory: Negative.   Cardiovascular: Negative.   Gastrointestinal: Negative.   Genitourinary: Positive for vaginal discharge.  Skin:       Skin lesion  All other systems reviewed and are negative.  Filed Vitals:   11/09/14 0940  BP: 116/77  Pulse: 73  Temp: 98.6 F (37 C)  TempSrc: Oral  Height: 5\' 4"  (1.626 m)  Weight: 143 lb 11.2 oz (65.182 kg)       Objective:   Physical Exam  Constitutional: She appears well-developed. No distress.  Cardiovascular: Normal rate, regular rhythm, normal heart sounds and intact distal pulses.   No murmur heard. Pulmonary/Chest: Effort normal and breath sounds normal. No respiratory distress. She has no wheezes.  Abdominal: Soft. Bowel sounds are normal. She exhibits no distension and no mass. There is no tenderness.  Genitourinary: Cervix exhibits discharge. Cervix exhibits no motion tenderness. Vaginal discharge found.    Skin:     Nursing note and vitals reviewed.      Assessment:     Vaginitis Skin tags     Plan:     Check problem list.

## 2014-11-09 NOTE — Assessment & Plan Note (Signed)
Wet prep positive for yeast. This is recurrent and has had more than 4 episodes in last year. As discussed with her at next visit we will screen her for DM especially with hx of DM in her mom. Start Diflucan 1 tab weekly for 4 wks.

## 2014-11-09 NOTE — Assessment & Plan Note (Signed)
Patient reassured this is benign. If getting bigger will benefit from cosmetic removal.  We will watch for now.   NB: Also small spot of hypopigmentation was seen on her back, ?? Start of vitiligo.

## 2014-11-09 NOTE — Patient Instructions (Signed)
It was nice seeing you again, your skin lesion looks a tiny skin tags which is benign, since you don't have any pain or itching I will recommend we watch, if it increases in number then cosmetic surgery will be the best option. For your vaginal discharge we will get wet prep and call you with result.

## 2014-11-15 ENCOUNTER — Telehealth: Payer: Self-pay | Admitting: Family Medicine

## 2014-11-15 NOTE — Telephone Encounter (Signed)
Yes. 1 tablet per week for 4 wks.

## 2014-11-15 NOTE — Telephone Encounter (Signed)
Pt called and would like clarification on her medication Diflucan. They gave her 4 pills and the instructions say 1 tablet a week. She just wanted to make sure that this is correct and she will be taking them for 4 weeks. jw

## 2014-11-15 NOTE — Telephone Encounter (Signed)
LM with clarification. Calley Drenning,CMA

## 2015-01-04 ENCOUNTER — Encounter: Payer: Self-pay | Admitting: Family Medicine

## 2015-01-04 ENCOUNTER — Ambulatory Visit (INDEPENDENT_AMBULATORY_CARE_PROVIDER_SITE_OTHER): Payer: BLUE CROSS/BLUE SHIELD | Admitting: Family Medicine

## 2015-01-04 ENCOUNTER — Other Ambulatory Visit (HOSPITAL_COMMUNITY)
Admission: RE | Admit: 2015-01-04 | Discharge: 2015-01-04 | Disposition: A | Discharge status: Home / Self Care | Payer: BLUE CROSS/BLUE SHIELD | Source: Ambulatory Visit | Attending: Family Medicine | Admitting: Family Medicine

## 2015-01-04 ENCOUNTER — Telehealth: Payer: Self-pay | Admitting: *Deleted

## 2015-01-04 VITALS — BP 94/64 | HR 86 | Temp 98.4°F | Ht 66.5 in | Wt 142.4 lb

## 2015-01-04 DIAGNOSIS — Z1151 Encounter for screening for human papillomavirus (HPV): Secondary | ICD-10-CM | POA: Insufficient documentation

## 2015-01-04 DIAGNOSIS — Z01419 Encounter for gynecological examination (general) (routine) without abnormal findings: Secondary | ICD-10-CM | POA: Diagnosis not present

## 2015-01-04 DIAGNOSIS — R87810 Cervical high risk human papillomavirus (HPV) DNA test positive: Secondary | ICD-10-CM | POA: Insufficient documentation

## 2015-01-04 DIAGNOSIS — Z0000 Encounter for general adult medical examination without abnormal findings: Secondary | ICD-10-CM

## 2015-01-04 DIAGNOSIS — Z01411 Encounter for gynecological examination (general) (routine) with abnormal findings: Secondary | ICD-10-CM | POA: Insufficient documentation

## 2015-01-04 DIAGNOSIS — IMO0001 Reserved for inherently not codable concepts without codable children: Secondary | ICD-10-CM

## 2015-01-04 LAB — COMPREHENSIVE METABOLIC PANEL
ALBUMIN: 4.1 g/dL (ref 3.5–5.2)
ALT: 14 U/L (ref 0–35)
AST: 13 U/L (ref 0–37)
Alkaline Phosphatase: 50 U/L (ref 39–117)
BUN: 12 mg/dL (ref 6–23)
CALCIUM: 9.1 mg/dL (ref 8.4–10.5)
CHLORIDE: 105 meq/L (ref 96–112)
CO2: 28 mEq/L (ref 19–32)
Creat: 0.61 mg/dL (ref 0.50–1.10)
Glucose, Bld: 71 mg/dL (ref 70–99)
Potassium: 3.9 mEq/L (ref 3.5–5.3)
SODIUM: 140 meq/L (ref 135–145)
Total Bilirubin: 0.3 mg/dL (ref 0.2–1.2)
Total Protein: 7 g/dL (ref 6.0–8.3)

## 2015-01-04 NOTE — Telephone Encounter (Signed)
LM for patient with message from MD.  Sagan Wurzel,CMA ° °

## 2015-01-04 NOTE — Progress Notes (Signed)
Patient ID: Sarah Prince, female   DOB: Feb 19, 1986, 29 y.o.   MRN: [id number] Subjective:     Sarah Prince is a 29 y.o. female and is here for a comprehensive physical exam. Sarah patient reports no problems.  History   Social History  . Marital Status: Married    Spouse Name: N/A  . Number of Children: N/A  . Years of Education: N/A   Occupational History  . Not on file.   Social History Main Topics  . Smoking status: Never Smoker   . Smokeless tobacco: Never Used  . Alcohol Use: No  . Drug Use: No  . Sexual Activity: Yes    Birth Control/ Protection: None   Other Topics Concern  . Not on file   Social History Narrative   Pt lives with her son Sarah Prince) born in November 2009 and father of baby Sarah Prince Sarah Prince).  She Has associated, working as a Quarry manager.  Student, working on [employer] at [employer]. Plans on career in business.  Parents live in [place].  Moved to [place] in 2005, travels back to [place], has also previously lived in [place].   Health Maintenance  Topic Date Due  . PAP SMEAR  03/04/2015  . INFLUENZA VACCINE  05/06/2015  . TETANUS/TDAP  10/09/2022  . HIV Screening  Completed    Sarah following portions of Sarah patient's history were reviewed and updated as appropriate: allergies, current medications, past family history, past medical history, past surgical history and problem list.  Review of Systems Pertinent items are noted in HPI.   Objective:    BP 94/64 mmHg  Pulse 86  Temp(Src) 98.4 F (36.9 C) (Oral)  Ht 5' 6.5" (1.689 m)  Wt 142 lb 6 oz (64.581 kg)  BMI 22.64 kg/m2  LMP 12/20/2014 General appearance: alert, cooperative and appears stated age Head: Normocephalic, without obvious abnormality, atraumatic Eyes: conjunctivae/corneas clear. PERRL, EOM's intact. Fundi benign. Ears: normal TM's and external ear canals both ears Throat: lips, mucosa, and tongue normal; teeth and gums normal Neck: no adenopathy, no carotid bruit, no JVD, supple, symmetrical,  trachea midline and thyroid not enlarged, symmetric, no tenderness/mass/nodules Lungs: clear to auscultation bilaterally Heart: regular rate and rhythm, S1, S2 normal, no murmur, click, rub or gallop Abdomen: soft, non-tender; bowel sounds normal; no masses,  no organomegaly Pelvic: cervix normal in appearance, external genitalia normal, no adnexal masses or tenderness, no cervical motion tenderness, rectovaginal septum normal, uterus normal size, shape, and consistency and vagina normal without discharge Extremities: extremities normal, atraumatic, no cyanosis or edema Skin: Skin color, texture, turgor normal. No rashes or lesions Neurologic: Grossly normal    Assessment:    Healthy female exam. Normal exam      Plan:  PAP completed. Cmet and HIV testing done today. I will call her with result.   See After Visit Summary for Counseling Recommendations

## 2015-01-04 NOTE — Patient Instructions (Signed)
Pap Test A Pap test checks the cells on the surface of your cervix. Your doctor will look for cell changes that are not normal, an infection, or cancer. If the cells no longer look normal, it is called dysplasia. Dysplasia can turn into cancer. Regular Pap tests are important to stop cancer from developing. BEFORE THE PROCEDURE  Ask your doctor when to schedule your Pap test. Timing the test around your period may be important.  Do not douche or have sex (intercourse) for 24 hours before the test.  Do not put creams on your vagina or use tampons for 24 hours before the test.  Go pee (urinate) just before the test. PROCEDURE  You will lie on an exam table with your feet in stirrups.  A warm metal or plastic tool (speculum) will be put in your vagina to open it up.  Your doctor will use a small, plastic brush or wooden spatula to take cells from your cervix.  The cells will be put in a lab container.  The cells will be checked under a microscope to see if they are normal or not. AFTER THE PROCEDURE Get your test results. If they are abnormal, you may need more tests. Document Released: 10/24/2010 Document Revised: 12/14/2011 Document Reviewed: 09/17/2011 [employer] Patient Information 2015 [employer]. This information is not intended to replace advice given to you by your health care provider. Make sure you discuss any questions you have with your health care provider.  

## 2015-01-04 NOTE — Telephone Encounter (Signed)
-----   Message from Kinnie Feil, MD sent at 01/04/2015  4:18 PM EDT ----- Please contact patient to let her know that her form is ready for pick up.  I left her son and her own form in the front office.

## 2015-01-05 LAB — HIV ANTIBODY (ROUTINE TESTING W REFLEX): HIV 1&2 Ab, 4th Generation: NONREACTIVE

## 2015-01-07 ENCOUNTER — Telehealth: Payer: Self-pay | Admitting: Family Medicine

## 2015-01-07 LAB — CYTOLOGY - PAP

## 2015-01-07 NOTE — Telephone Encounter (Signed)
Test result: HIV and lab discussed with patient. She also requested that her form from last week be mailed to her home address. We will do so.

## 2015-01-10 ENCOUNTER — Telehealth: Payer: Self-pay | Admitting: Family Medicine

## 2015-01-10 ENCOUNTER — Encounter: Payer: Self-pay | Admitting: Family Medicine

## 2015-01-10 NOTE — Telephone Encounter (Signed)
Message left to call back about test result. I will also send result letter to her home address.

## 2015-10-11 ENCOUNTER — Ambulatory Visit (INDEPENDENT_AMBULATORY_CARE_PROVIDER_SITE_OTHER): Payer: BLUE CROSS/BLUE SHIELD | Admitting: Family Medicine

## 2015-10-11 ENCOUNTER — Telehealth: Payer: Self-pay | Admitting: Family Medicine

## 2015-10-11 ENCOUNTER — Encounter: Payer: Self-pay | Admitting: Family Medicine

## 2015-10-11 VITALS — BP 106/78 | HR 80 | Temp 98.1°F | Wt 138.0 lb

## 2015-10-11 DIAGNOSIS — Z319 Encounter for procreative management, unspecified: Secondary | ICD-10-CM

## 2015-10-11 NOTE — Assessment & Plan Note (Signed)
Counseling done on infertility management. I recommended referral to fertility clinic with Gyn. I again re-ordered referral. Patient given number to call for an appointment. F/U with me as needed.

## 2015-10-11 NOTE — Telephone Encounter (Signed)
Spoke with patient and informed her of [facility].  Their number is [phone].  Patient advised to go ahead and call them and we will fax her office notes over once they have been completed.  Will forward to MD so she can make me aware once this has been completed. Coraleigh Sheeran,CMA

## 2015-10-11 NOTE — Patient Instructions (Addendum)
It was nice seeing you again. I just resend your referral to the gynecologist. Please call again to see if they have openings now. [facility]  Address: [address], [place], [place] [address]  Phone: [phone]                       Infertility Infertility is when you are unable to get pregnant (conceive) after a year of having sex regularly without using birth control. Infertility can also mean that a woman is not able to carry a pregnancy to full term.  Both women and men can have fertility problems. WHAT CAUSES INFERTILITY? What Causes Infertility in Women? There are many possible causes of infertility in women. For some women, the cause of infertility is not known (unexplained infertility). Infertility can also be linked to more than one cause. Infertility problems in women can be caused by problems with the menstrual cycle or reproductive organs, certain medical conditions, and factors related to lifestyle and age.  Problems with your menstrual cycle can interfere with your ovaries producing eggs (ovulation). This can make it difficult to get pregnant. This includes having a menstrual cycle that is very long, very short, or irregular.  Problems with reproductive organs can include:  An abnormally narrow cervix or a cervix that does not remain closed during a pregnancy.  A blockage in your fallopian tubes.  An abnormally shaped uterus.  Uterine fibroids. This is a tissue mass (tumor) that can develop on your uterus.  Medical conditions that can affect a woman's fertility include:  Polycystic ovarian syndrome (PCOS). This is a hormonal disorder that can cause small cysts to grow on your ovaries. This is the most common cause of infertility in women.  Endometriosis. This is a condition in which the tissue that lines your uterus (endometrium) grows outside of its normal location.  Primary ovary insufficiency. This is when your ovaries stop producing eggs and hormones  before the age of 13.  Sexually transmitted diseases, such as chlamydia or gonorrhea. These infections can cause scarring in your fallopian tubes. This makes it difficult for eggs to reach your uterus.  Autoimmune disorders. These are disorders in which your immune system attacks normal, healthy cells.  Hormone imbalances.  Other factors include:  Age. A woman's fertility declines with age, especially after her mid-4s.  Being under- or overweight.  Drinking too much alcohol.  Using drugs.  Exercising excessively.  Being exposed to environmental toxins, such as radiation, pesticides, and certain chemicals. What Causes Infertility in Men? There are many causes of infertility in men. Infertility can be linked to more than one cause. Infertility problems in men can be caused by problems with sperm or the reproductive organs, certain medical conditions, and factors related to lifestyle and age. Some men have unexplained infertility.   Problems with sperm. Infertility can result if there is a problem producing:  Enough sperm (low sperm count).  Enough normally-shaped sperm (sperm morphology).  Sperm that are able to reach the egg (poor motility).  Infertility can also be caused by:  A problem with hormones.  Enlarged veins (varicoceles), cysts (spermatoceles), or tumors of the testicles.  Sexual dysfunction.  Injury to the testicles.  A birth defect, such as not having the tubes that carry sperm (vas deferens).  Medical conditions that can affect a man's fertility include:  Diabetes.  Cancer treatments, such as chemotherapy or radiation.  Klinefelter syndrome. This is an inherited genetic disorder.   Thyroid problems,  such as an under- or overactive thyroid.  Cystic fibrosis.  Sexually transmitted diseases.  Other factors include:  Age. A man's fertility declines with age.  Drinking too much alcohol.  Using drugs.  Being exposed to environmental toxins,  such as pesticides and lead. WHAT ARE THE SYMPTOMS OF INFERTILITY? Being unable to get pregnant after one year of having regular sex without using birth control is the only sign of infertility.  HOW IS INFERTILITY DIAGNOSED? In order to be diagnosed with infertility, both partners will have a physical exam. Both partners will also have an extensive medical and sexual history taken. If there is no obvious reason for infertility, additional tests may be done. What Tests Will Women Have? Women may first have tests to check whether they are ovulating each month. The tests may include:  Blood tests to check hormone levels.  An ultrasound of the ovaries. This looks for possible problems on or in the ovaries.  Taking a small sample of the tissue that lines the uterus for examination under a microscope (endometrial biopsy). Women who are ovulating may have additional tests. These may include:  Hysterosalpingography.  This is an X-ray of the fallopian tubes and uterus taken after a specific type of dye is injected.  This test can show the shape of the uterus and whether the fallopian tubes are open.  Laparoscopy.  In this test, a lighted tube (laparoscope) is used to look for problems in the fallopian tubes and other female organs.  Transvaginal ultrasound.  This is an imaging test to check for abnormalities of the uterus and ovaries.  A health care provider can use this test to count the number of follicles on the ovaries.  Hysteroscopy.  This test involves using a lighted tube to examine the cervix and inside the uterus.  It is done to find any abnormalities inside the uterus. What Tests Will Men Have? Tests for men's infertility includes:  Semen tests to check sperm count, morphology, and motility.  Blood tests to check for hormone levels.  Taking a small sample of tissue from inside a testicle (biopsy). This is examined under a microscope.  Blood tests to check for genetic  abnormalities (genetic testing). HOW ARE WOMEN TREATED FOR INFERTILITY?  Treatment depends on the cause of infertility. Most cases of infertility in women are treated with medicine or surgery.  Women may take medicine to:  Correct ovulation problems.  Treat other health conditions, such as PCOS.  Surgery may be done to:  Repair damage to the ovaries, fallopian tubes, cervix, or uterus.  Remove growths from the uterus.  Remove scar tissue from the uterus, pelvis, or other female organs. HOW ARE MEN TREATED FOR INFERTILITY?  Treatment depends on the cause of infertility. Most cases of infertility in men are treated with medicine or surgery.   Men may take medicine to:  Correct hormone problems.  Treat other health conditions.  Treat sexual dysfunction.  Surgery may be done to:  Remove blockages in the reproductive tract.  Correct other structural problems of the reproductive tract. WHAT IS ASSISTED REPRODUCTIVE TECHNOLOGY? Assisted reproductive technology (ART) refers to all treatments and procedures that combine eggs and sperm outside the body to try to help a couple conceive. ART is often combined with fertility drugs to stimulate ovulation. Sometimes ART is done using eggs retrieved from another woman's body (donor eggs) or from previously frozen fertilized eggs (embryos).  There are different types of ART. These include:   Intrauterine insemination (IUI).  In this procedure, sperm is placed directly into a woman's uterus with a long, thin tube.  This may be most effective for infertility caused by sperm problems, including low sperm count and low motility.  Can be used in combination with fertility drugs.  In vitro fertilization (IVF).  This is often done when a woman's fallopian tubes are blocked or when a man has low sperm counts.  Fertility drugs stimulate the ovaries to produce multiple eggs. Once mature, these eggs are removed from the body and combined with  the sperm to be fertilized.  These fertilized eggs are then placed in the woman's uterus.   This information is not intended to replace advice given to you by your health care provider. Make sure you discuss any questions you have with your health care provider.   Document Released: 09/24/2003 Document Revised: 06/12/2015 Document Reviewed: 06/06/2014 Elsevier Interactive Patient Education [employer].

## 2015-10-11 NOTE — Telephone Encounter (Signed)
Pt called and said that the infertility clinic at [facility] is no longer there. She would like a name and number to a different place. jw

## 2015-10-11 NOTE — Progress Notes (Signed)
Subjective:     Patient ID: Sarah Prince, female   DOB: Aug 18, 1986, 30 y.o.   MRN: [id number]  HPI Infertility: Patient G2P2, she has been trying to get pregnant for more than 2 years. Her last child is 57 year old. She has been buying ovulation test OTC that confirms ovulation but she is yet not getting pregnant. Her LMP was 10/04/15, and it is regular coming every month, denies intermenstrual bleed. Denies any belly pain, no vaginal discharge, no weight change. 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 She is off her meds due to cost. She was referred to Gyn over a year ago but never made appointment. She is needing referral again.  No current outpatient prescriptions on file prior to visit.   No current facility-administered medications on file prior to visit.   Past Medical History  Diagnosis Date  . Prolactinoma ([place])   . Preterm delivery      Review of Systems  Respiratory: Negative.   Cardiovascular: Negative.   Genitourinary: Negative for menstrual problem.       Fertility problem  All other systems reviewed and are negative.  Filed Vitals:   10/11/15 0912  BP: 106/78  Pulse: 80  Temp: 98.1 F (36.7 C)  TempSrc: Oral  Weight: 138 lb (62.596 kg)       Objective:   Physical Exam  Constitutional: She is oriented to person, place, and time. She appears well-developed. No distress.  Cardiovascular: Normal rate, regular rhythm, normal heart sounds and intact distal pulses.   No murmur heard. Pulmonary/Chest: Effort normal and breath sounds normal. No respiratory distress. She has no wheezes.  Abdominal: Soft. Bowel sounds are normal. She exhibits no distension and no mass. There is no tenderness.  Neurological: She is alert and oriented to person, place, and time.  Nursing note and vitals reviewed.      Assessment:     Infertility     Plan:     Check problem list.

## 2016-02-21 ENCOUNTER — Encounter: Payer: BLUE CROSS/BLUE SHIELD | Admitting: Family Medicine

## 2016-02-28 ENCOUNTER — Encounter: Payer: Self-pay | Admitting: Family Medicine

## 2016-02-28 ENCOUNTER — Other Ambulatory Visit (HOSPITAL_COMMUNITY)
Admission: RE | Admit: 2016-02-28 | Discharge: 2016-02-28 | Disposition: A | Discharge status: Home / Self Care | Payer: BLUE CROSS/BLUE SHIELD | Source: Ambulatory Visit | Attending: Family Medicine | Admitting: Family Medicine

## 2016-02-28 ENCOUNTER — Ambulatory Visit (INDEPENDENT_AMBULATORY_CARE_PROVIDER_SITE_OTHER): Payer: BLUE CROSS/BLUE SHIELD | Admitting: Family Medicine

## 2016-02-28 VITALS — BP 115/66 | HR 79 | Temp 98.3°F | Wt 141.0 lb

## 2016-02-28 DIAGNOSIS — Z01411 Encounter for gynecological examination (general) (routine) with abnormal findings: Secondary | ICD-10-CM | POA: Diagnosis not present

## 2016-02-28 DIAGNOSIS — Z124 Encounter for screening for malignant neoplasm of cervix: Secondary | ICD-10-CM | POA: Diagnosis not present

## 2016-02-28 NOTE — Progress Notes (Signed)
Patient ID: Sarah Prince, female   DOB: 02-14-1986, 30 y.o.   MRN: [id number] Subjective:     Sarah Prince is a 30 y.o. female and is here for a comprehensive physical exam. The patient reports no problems.  Social History   Social History  . Marital Status: Married    Spouse Name: N/A  . Number of Children: N/A  . Years of Education: N/A   Occupational History  . Not on file.   Social History Main Topics  . Smoking status: Never Smoker   . Smokeless tobacco: Never Used  . Alcohol Use: No  . Drug Use: No  . Sexual Activity: Yes    Birth Control/ Protection: None   Other Topics Concern  . Not on file   Social History Narrative   Pt lives with her son Sarah Prince) born in November 2009 and father of baby Sarah Prince).  She Has associated, working as a Quarry manager.  Student, working on [employer] at [employer]. Plans on career in business.  Parents live in [place].  Moved to [place] in 2005, travels back to [place], has also previously lived in [place].   Health Maintenance  Topic Date Due  . INFLUENZA VACCINE  05/05/2016  . PAP SMEAR  01/03/2018  . TETANUS/TDAP  10/09/2022  . HIV Screening  Completed    The following portions of the patient's history were reviewed and updated as appropriate: allergies, current medications, past family history, past medical history, past social history, past surgical history and problem list.  Review of Systems Pertinent items are noted in HPI.   Objective:    BP 115/66 mmHg  Pulse 79  Temp(Src) 98.3 F (36.8 C) (Oral)  Wt 141 lb (63.957 kg)  SpO2 100%  LMP 01/13/2016 General appearance: alert and cooperative Head: Normocephalic, without obvious abnormality, atraumatic Eyes: conjunctivae/corneas clear. PERRL, EOM's intact. Fundi benign. Ears: normal TM's and external ear canals both ears Throat: lips, mucosa, and tongue normal; teeth and gums normal Neck: no adenopathy, no carotid bruit, no JVD, supple, symmetrical, trachea midline and  thyroid not enlarged, symmetric, no tenderness/mass/nodules Lungs: clear to auscultation bilaterally Heart: regular rate and rhythm, S1, S2 normal, no murmur, click, rub or gallop Abdomen: soft, non-tender; bowel sounds normal; no masses,  no organomegaly Pelvic: cervix normal in appearance, external genitalia normal, no adnexal masses or tenderness, no cervical motion tenderness, rectovaginal septum normal, uterus normal size, shape, and consistency and vagina normal without discharge Extremities: extremities normal, atraumatic, no cyanosis or edema Pulses: 2+ and symmetric Skin: Skin color, texture, turgor normal. No rashes or lesions Neurologic: Alert and oriented X 3, normal strength and tone. Normal symmetric reflexes. Normal coordination and gait    Assessment:    Healthy female exam.  With Gyn exam      Plan:     Up to date with her health maintenance. Concern about +HPV in previous PAP although this is not required given age less than 39 but it was somehow included in the test at that time. As discussed with patient we will get PAP done today without HPV and if it is normal, she will return to screening every 3 yrs. She agreed with plan. Age appropriate counseling given. F/U as needed.

## 2016-02-28 NOTE — Patient Instructions (Signed)
Pap Test WHY AM I HAVING THIS TEST? A pap test is sometimes called a pap smear. It is a screening test that is used to check for signs of cancer of the vagina, cervix, and uterus. The test can also identify the presence of infection or precancerous changes. Your health care provider will likely recommend you have this test done on a regular basis. This test may be done:  Every 3 years, starting at age 28.  Every 5 years, in combination with testing for the presence of human papillomavirus (HPV).  More or less often depending on other medical conditions.  WHAT KIND OF SAMPLE IS TAKEN? Using a small cotton swab, plastic spatula, or brush, your health care provider will collect a sample of cells from the surface of your cervix. Your cervix is the opening to your uterus, also called a womb. Secretions from the cervix and vagina may also be collected. HOW DO I PREPARE FOR THE TEST?  Be aware of where you are in your menstrual cycle. You may be asked to reschedule the test if you are menstruating on the day of the test.  You may need to reschedule if you have a known vaginal infection on the day of the test.  You may be asked to avoid douching or taking a bath the day before or the day of the test.  Some medicines can cause abnormal test results, such as digitalis and tetracycline. Talk with your health care provider before your test if you take one of these medicines. WHAT DO THE RESULTS MEAN? Abnormal test results may indicate a number of health conditions. These may include:  Cancer. Although pap test results cannot be used to diagnose cancer of the cervix, vagina, or uterus, they may suggest the possibility of cancer. Further tests would be required to determine if cancer is present.  Sexually transmitted disease.  Fungal infection.  Parasite infection.  Herpes infection.  A condition causing or contributing to infertility. It is your responsibility to obtain your test results. Ask  the lab or department performing the test when and how you will get your results. Contact your health care provider to discuss any questions you have about your results.   This information is not intended to replace advice given to you by your health care provider. Make sure you discuss any questions you have with your health care provider.   Document Released: 12/12/2002 Document Revised: 10/12/2014 Document Reviewed: 02/12/2014 Elsevier Interactive Patient Education 2016 Elsevier Inc.  

## 2016-03-03 ENCOUNTER — Other Ambulatory Visit (HOSPITAL_COMMUNITY): Payer: Self-pay | Admitting: Obstetrics & Gynecology

## 2016-03-03 DIAGNOSIS — N979 Female infertility, unspecified: Secondary | ICD-10-CM

## 2016-03-03 LAB — CYTOLOGY - PAP

## 2016-03-04 ENCOUNTER — Telehealth: Payer: Self-pay | Admitting: Family Medicine

## 2016-03-04 ENCOUNTER — Other Ambulatory Visit: Payer: Self-pay | Admitting: Family Medicine

## 2016-03-04 MED ORDER — FLUCONAZOLE 150 MG PO TABS: 150.0000 mg | ORAL_TABLET | Freq: Once | ORAL | Status: DC

## 2016-03-04 NOTE — Telephone Encounter (Signed)
Multiple attempts were made to contact patient about test result. No response. Message left for her to call back.   Note: PAP result is neg for malignancy. ++ candida organism. I will e-scribe diflucan one dose to her pharmacy. Will inform her to pick up med when she calls back.

## 2016-03-04 NOTE — Telephone Encounter (Signed)
PAP result discussed with patient. She is informed of + candida and that I have e-scribed her diflucan which she can pick up from the pharmacy.

## 2016-03-09 ENCOUNTER — Ambulatory Visit (HOSPITAL_COMMUNITY)
Admission: RE | Admit: 2016-03-09 | Discharge: 2016-03-09 | Disposition: A | Discharge status: Home / Self Care | Payer: BLUE CROSS/BLUE SHIELD | Source: Ambulatory Visit | Attending: Obstetrics & Gynecology | Admitting: Obstetrics & Gynecology

## 2016-03-09 DIAGNOSIS — N979 Female infertility, unspecified: Secondary | ICD-10-CM | POA: Diagnosis present

## 2016-03-09 MED ORDER — IOPAMIDOL (ISOVUE-300) INJECTION 61%
30.0000 mL | Freq: Once | INTRAVENOUS | Status: AC | PRN
  Administered 2016-03-09: 3 mL

## 2016-06-18 ENCOUNTER — Encounter: Payer: Self-pay | Admitting: Family Medicine

## 2017-03-18 ENCOUNTER — Ambulatory Visit (INDEPENDENT_AMBULATORY_CARE_PROVIDER_SITE_OTHER): Payer: BLUE CROSS/BLUE SHIELD | Admitting: Family Medicine

## 2017-03-18 ENCOUNTER — Encounter: Payer: Self-pay | Admitting: Family Medicine

## 2017-03-18 VITALS — BP 100/60 | HR 90 | Temp 98.3°F | Ht 66.0 in | Wt 142.0 lb

## 2017-03-18 DIAGNOSIS — G5712 Meralgia paresthetica, left lower limb: Secondary | ICD-10-CM | POA: Diagnosis not present

## 2017-03-18 MED ORDER — NAPROXEN 500 MG PO TABS: 500.0000 mg | ORAL_TABLET | Freq: Two times a day (BID) | ORAL | 0 refills | Status: DC

## 2017-03-18 NOTE — Progress Notes (Signed)
   Subjective: CC: Leg pain [id number] Sarah Prince is a 31 y.o. female presenting to clinic today for same day appointment. PCP: Kinnie Feil, MD Concerns today include:  1. Leg pain Patient reports that tingly/ numbness pain started about 2 days ago.  She reports that it is localized to the anteriolateral aspect of the left thigh.  No associated weakness, falls, injury, color change, swelling.  She has used an OTC analgesic cream with no relief.  She reports that she wears high heels often.  She is no longer on her fertility medications.  She notes that the pituitary adenoma has been well controlled with medications, so much so that she was able to come off of medication a few months ago.  She is actively trying to become pregnant.  Patient's last menstrual period was 03/15/2017.  She denies numbness, tingling, weakness elsewhere, or other neurologic deficits.  No Known Allergies  Social Hx reviewed: non smoker. MedHx, current medications and allergies reviewed.  Please see EMR. ROS: Per HPI  Objective: Office vital signs reviewed. BP 100/60   Pulse 90   Temp 98.3 F (36.8 C) (Oral)   Ht 5\' 6"  (1.676 m)   Wt 142 lb (64.4 kg)   LMP 03/15/2017   SpO2 98%   BMI 22.92 kg/m   Physical Examination:  General: Awake, alert, well nourished, fit appearing female, No acute distress HEENT: Normal, MMM Extremities: warm, well perfused, No edema, cyanosis or clubbing; +2 pulses bilaterally MSK: normal gait and normal station; no midline or paraspinal TTP to spine.  No palpable bony abnormalities.  She has full painless AROM of thoracic and lumbar spine.  Negative straight leg raise bilaterally.  Negative FADIR/ FABER testing. Skin: dry; intact; no rashes or lesions Neuro: 5/5 UE and LE Strength and light touch sensation grossly intact except for decreased light touch sensation to anteriolateral aspect of left thigh, normal heel walk and normal toe walk  Assessment/ Plan: 31 y.o. female   1.  Meralgia paraesthetica, left. Clinical exam consistent with distribution of lateral cutaneous nerve.  Her body habitus is not typical of this but her symptoms are consistent.  Her exam was fairly benign except for decreased light touch sensation to anteriolateral aspect of left thigh.  Lower leg sensation in tact.  No other focal neurologic deficits.  Given acute nature of this, imaging has not been ordered.  Will treat conservatively with NSAIDs, instructed patient to avoid tight fitting clothes/ underwear, avoid high heels, rest.  Handout provided.  DDx to consider are intraabdominal pathologies including cancers that might cause compression.  She has a h/o pituitary adenoma.  Her spinal exam was normal.  Doubt that this is coming from lumbar spine but also to be kept amongst differential.  We discussed that if symptoms worsen, do not resolve with current therapies and changes that she should return for reevaluation. - naproxen (NAPROSYN) 500 MG tablet; Take 1 tablet (500 mg total) by mouth 2 (two) times daily with a meal.  Dispense: 14 tablet; Refill: 0 - Follow up prn   Janora Norlander, DO PGY-3, [facility]

## 2017-03-18 NOTE — Patient Instructions (Signed)
I have given you a handout about meralgia paraesthetica.  I have also prescribed a short course of antiinflammatory medication.  Take this twice a day as needed with food.  If your symptoms worsen or you experience any of the symptoms we discussed, seek immediate medical attention.

## 2017-08-17 ENCOUNTER — Emergency Department (HOSPITAL_COMMUNITY): Payer: BLUE CROSS/BLUE SHIELD

## 2017-08-17 ENCOUNTER — Emergency Department (HOSPITAL_COMMUNITY)
Admission: EM | Admit: 2017-08-17 | Discharge: 2017-08-17 | Disposition: A | Discharge status: Home / Self Care | Payer: BLUE CROSS/BLUE SHIELD | Attending: Emergency Medicine | Admitting: Emergency Medicine

## 2017-08-17 ENCOUNTER — Encounter (HOSPITAL_COMMUNITY): Payer: Self-pay | Admitting: *Deleted

## 2017-08-17 DIAGNOSIS — Y92410 Unspecified street and highway as the place of occurrence of the external cause: Secondary | ICD-10-CM | POA: Insufficient documentation

## 2017-08-17 DIAGNOSIS — Y999 Unspecified external cause status: Secondary | ICD-10-CM | POA: Diagnosis not present

## 2017-08-17 DIAGNOSIS — S199XXA Unspecified injury of neck, initial encounter: Secondary | ICD-10-CM | POA: Diagnosis present

## 2017-08-17 DIAGNOSIS — M545 Low back pain: Secondary | ICD-10-CM | POA: Diagnosis not present

## 2017-08-17 DIAGNOSIS — Y939 Activity, unspecified: Secondary | ICD-10-CM | POA: Insufficient documentation

## 2017-08-17 DIAGNOSIS — S161XXA Strain of muscle, fascia and tendon at neck level, initial encounter: Secondary | ICD-10-CM | POA: Diagnosis not present

## 2017-08-17 LAB — POC URINE PREG, ED: PREG TEST UR: NEGATIVE

## 2017-08-17 MED ORDER — IBUPROFEN 600 MG PO TABS: 600.0000 mg | ORAL_TABLET | Freq: Four times a day (QID) | ORAL | 0 refills | Status: DC | PRN

## 2017-08-17 MED ORDER — ACETAMINOPHEN 500 MG PO TABS
1000.0000 mg | ORAL_TABLET | Freq: Once | ORAL | Status: AC
  Administered 2017-08-17: 1000 mg via ORAL
  Filled 2017-08-17: qty 2

## 2017-08-17 MED ORDER — CYCLOBENZAPRINE HCL 10 MG PO TABS: 10.0000 mg | ORAL_TABLET | Freq: Two times a day (BID) | ORAL | 0 refills | Status: DC | PRN

## 2017-08-17 NOTE — ED Notes (Signed)
Mother returned to room d/t urine preg not resulted.

## 2017-08-17 NOTE — ED Notes (Signed)
ED Provider at bedside. 

## 2017-08-17 NOTE — Discharge Instructions (Addendum)
Take ibuprofen and or tylenol as needed for pain. Use ice and heat as needed for symptom control.   Return for new concerns.

## 2017-08-17 NOTE — ED Notes (Signed)
[facility] at bedside 

## 2017-08-17 NOTE — ED Notes (Signed)
Patient transported to CT 

## 2017-08-17 NOTE — ED Notes (Signed)
Patient up to restroom.
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
## 2017-08-17 NOTE — ED Notes (Signed)
Patient transported to X-ray 

## 2017-11-12 ENCOUNTER — Telehealth: Payer: Self-pay | Admitting: Family Medicine

## 2017-11-17 ENCOUNTER — Encounter (HOSPITAL_BASED_OUTPATIENT_CLINIC_OR_DEPARTMENT_OTHER): Payer: Self-pay

## 2017-11-17 ENCOUNTER — Emergency Department (HOSPITAL_BASED_OUTPATIENT_CLINIC_OR_DEPARTMENT_OTHER)
Admission: EM | Admit: 2017-11-17 | Discharge: 2017-11-17 | Disposition: A | Discharge status: Home / Self Care | Payer: BLUE CROSS/BLUE SHIELD | Attending: Emergency Medicine | Admitting: Emergency Medicine

## 2017-11-17 ENCOUNTER — Emergency Department (HOSPITAL_BASED_OUTPATIENT_CLINIC_OR_DEPARTMENT_OTHER): Payer: BLUE CROSS/BLUE SHIELD

## 2017-11-17 ENCOUNTER — Other Ambulatory Visit: Payer: Self-pay

## 2017-11-17 DIAGNOSIS — D573 Sickle-cell trait: Secondary | ICD-10-CM | POA: Diagnosis not present

## 2017-11-17 DIAGNOSIS — Z86018 Personal history of other benign neoplasm: Secondary | ICD-10-CM | POA: Insufficient documentation

## 2017-11-17 DIAGNOSIS — R079 Chest pain, unspecified: Secondary | ICD-10-CM | POA: Insufficient documentation

## 2017-11-17 DIAGNOSIS — R05 Cough: Secondary | ICD-10-CM | POA: Insufficient documentation

## 2017-11-17 LAB — BASIC METABOLIC PANEL
Anion gap: 10 (ref 5–15)
BUN: 12 mg/dL (ref 6–20)
CHLORIDE: 103 mmol/L (ref 101–111)
CO2: 24 mmol/L (ref 22–32)
CREATININE: 0.84 mg/dL (ref 0.44–1.00)
Calcium: 8.9 mg/dL (ref 8.9–10.3)
GFR calc non Af Amer: >60 mL/min (ref >60)
Glucose, Bld: 98 mg/dL (ref 65–99)
POTASSIUM: 3.6 mmol/L (ref 3.5–5.1)
SODIUM: 137 mmol/L (ref 135–145)

## 2017-11-17 LAB — TROPONIN I

## 2017-11-17 LAB — CBC WITH DIFFERENTIAL/PLATELET
Basophils Absolute: 0 10*3/uL (ref 0.0–0.1)
Basophils Relative: 0 %
EOS ABS: 0.1 10*3/uL (ref 0.0–0.7)
Eosinophils Relative: 1 %
HCT: 35.3 % — ABNORMAL LOW (ref 36.0–46.0)
HEMOGLOBIN: 12.4 g/dL (ref 12.0–15.0)
LYMPHS ABS: 2 10*3/uL (ref 0.7–4.0)
LYMPHS PCT: 23 %
MCH: 28.1 pg (ref 26.0–34.0)
MCHC: 35.1 g/dL (ref 30.0–36.0)
MCV: 80 fL (ref 78.0–100.0)
Monocytes Absolute: 1 10*3/uL (ref 0.1–1.0)
Monocytes Relative: 11 %
NEUTROS PCT: 65 %
Neutro Abs: 5.5 10*3/uL (ref 1.7–7.7)
Platelets: 240 10*3/uL (ref 150–400)
RBC: 4.41 MIL/uL (ref 3.87–5.11)
RDW: 13.1 % (ref 11.5–15.5)
WBC: 8.5 10*3/uL (ref 4.0–10.5)

## 2017-11-17 MED ORDER — METHOCARBAMOL 500 MG PO TABS: 500.0000 mg | ORAL_TABLET | Freq: Three times a day (TID) | ORAL | 0 refills | Status: DC | PRN

## 2017-11-17 MED ORDER — NAPROXEN 500 MG PO TABS: 500.0000 mg | ORAL_TABLET | Freq: Two times a day (BID) | ORAL | 0 refills | Status: DC

## 2017-11-17 NOTE — Discharge Instructions (Signed)
Seen in the emergency department today for chest pain.  Your chest x-ray did not show signs of pneumonia.  Your EKG was completely normal.  The enzyme we checked to look at your heart was normal.  Your lab work did not show any anemia, electrolyte abnormalities, or any other concerning findings.   We have given you 2 prescriptions today: - Naproxen is a nonsteroidal anti-inflammatory medication that will help with pain and swelling. Be sure to take this medication as prescribed with food, 1 pill every 12 hours,  It should be taken with food, as it can cause stomach upset, and more seriously, stomach bleeding. Do not take other nonsteroidal anti-inflammatory medications with this such as Advil, Motrin, or Aleve.   - Robaxin is the muscle relaxer I have prescribed, this is meant to help with muscle tightness. Be aware that this medication may make you drowsy therefore the first time you take this it should be at a time you are in an environment where you can rest. Do not drive or operate heavy machinery when taking this medication.    I have prescribed new medications for you today. It is important that when you pick the prescription up you discuss the potential interactions of this medication with other medications you are taking, including over the counter medications, with the pharmacists.  This new medication has potential side effects. Be sure to contact your primary care provider or return to the emergency department if you are experiencing new symptoms that you are unable to tolerate after starting the medication. You need to receive medical evaluation immediately if you start to experience blistering of the skin, rash, swelling, or difficulty breathing as these signs could indicate a more serious medication side effect.    Follow up with your primary care provider as scheduled this Friday (02/15) for reevaluation.  Return to the emergency department for new or worsening symptoms including but not  limited to worsening chest pain, difficulty breathing, lightheadedness, dizziness, passing out, or any other concerns you may have.

## 2017-11-17 NOTE — ED Provider Notes (Signed)
[facility] EMERGENCY DEPARTMENT Provider Note   CSN: [id number] Arrival date & time: 11/17/17  2048     History   Chief Complaint Chief Complaint  Patient presents with  . Chest Pain    HPI Sarah Prince is a 32 y.o. female with a hx of pituitary adenoma and sickle cell trait who presents to the ED with chest pain that started this AM. Patient states she woke up with a substernal to left sided chest discomfort that occurs positionally. Patient states if she is bending forward, turning/twisting, coughing, or taking a very deep breath she will have the pain. Describes the pain as a pressing/pressure and possible muscle spasm type pain. Pain does not occur at rest. It does not occur/worsen with exertion. Denies nausea, vomiting, dyspnea, or diaphoresis.  Denies leg pain/swelling, hemoptysis, recent surgery/trauma, recent long travel, hormone use, personal hx of cancer, or hx of DVT/PE. Denies injury, heavy lifting, or change in activity.   HPI  Past Medical History:  Diagnosis Date  . Preterm delivery   . 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 Infertility management 10/11/2015  . Skin tag 11/09/2014  . Vaginitis and vulvovaginitis 09/21/2014  . Oligomenorrhea 12/10/2011  . Unspecified vitamin D deficiency 08/18/2010  . SICKLE CELL TRAIT 07/08/2009  . 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 NO PAST SURGERIES      OB History    Gravida Para Term Preterm AB Living   2 2 1 1   2    SAB TAB Ectopic Multiple Live Births           2       Home Medications    Prior to Admission medications   Medication Sig Start Date End Date Taking? Authorizing Provider  cabergoline (DOSTINEX) 0.5 MG tablet Take 0.5 mg by mouth once a week.   Yes [provider]    Family History Family History  Problem Relation Age of Onset  . Diabetes Mother     Social History Social History    Tobacco Use  . Smoking status: Never Smoker  . Smokeless tobacco: Never Used  Substance Use Topics  . Alcohol use: No  . Drug use: No     Allergies   Patient has no known allergies.   Review of Systems Review of Systems  Constitutional: Negative for chills and fever.  HENT: Negative for congestion, ear pain and rhinorrhea.   Respiratory: Positive for cough (mild ). Negative for shortness of breath.   Cardiovascular: Positive for chest pain. Negative for leg swelling.  Gastrointestinal: Negative for abdominal pain, blood in stool, nausea and vomiting.  Genitourinary: Negative for dysuria, frequency and hematuria.  Musculoskeletal: Negative for myalgias (lower extremities).  Neurological: Negative for dizziness, syncope, weakness, light-headedness and numbness.  All other systems reviewed and are negative.   Physical Exam Updated Vital Signs BP 113/74 (BP Location: Left Arm)   Pulse 89   Temp 97.6 F (36.4 C) (Oral)   Resp 18   Ht 5\' 6"  (1.676 m)   Wt 65.5 kg (144 lb 6.4 oz)   LMP 10/25/2017   SpO2 100%   BMI 23.31 kg/m   Physical Exam  Constitutional: She appears well-developed and well-nourished.  Non-toxic appearance. No distress.  HENT:  Head: Normocephalic and atraumatic.  Eyes: Conjunctivae are normal. Right eye exhibits no discharge. Left eye exhibits no discharge.  Neck: Normal range  of motion. Neck supple.  Cardiovascular: Normal rate and regular rhythm.  No murmur heard. Pulses:      Radial pulses are 2+ on the right side, and 2+ on the left side.  Pulmonary/Chest: Effort normal and breath sounds normal. No respiratory distress. She has no wheezes. She has no rales. She exhibits no tenderness, no crepitus and no edema.  Abdominal: Soft. She exhibits no distension. There is no tenderness.  Musculoskeletal:       Right lower leg: She exhibits no tenderness and no edema.       Left lower leg: She exhibits no tenderness and no edema.  Neurological: She  is alert.  Clear speech.   Skin: Skin is warm and dry. No rash noted.  Psychiatric: She has a normal mood and affect. Her behavior is normal.  Nursing note and vitals reviewed.   ED Treatments / Results  Labs Results for orders placed or performed during the hospital encounter of 45/80/99  Basic metabolic panel  Result Value Ref Range   Sodium 137 135 - 145 mmol/L   Potassium 3.6 3.5 - 5.1 mmol/L   Chloride 103 101 - 111 mmol/L   CO2 24 22 - 32 mmol/L   Glucose, Bld 98 65 - 99 mg/dL   BUN 12 6 - 20 mg/dL   Creatinine, Ser 0.84 0.44 - 1.00 mg/dL   Calcium 8.9 8.9 - 10.3 mg/dL   GFR calc non Af Amer >60 >60 mL/min   GFR calc Af Amer >60 >60 mL/min   Anion gap 10 5 - 15  CBC with Differential  Result Value Ref Range   WBC 8.5 4.0 - 10.5 K/uL   RBC 4.41 3.87 - 5.11 MIL/uL   Hemoglobin 12.4 12.0 - 15.0 g/dL   HCT 35.3 (L) 36.0 - 46.0 %   MCV 80.0 78.0 - 100.0 fL   MCH 28.1 26.0 - 34.0 pg   MCHC 35.1 30.0 - 36.0 g/dL   RDW 13.1 11.5 - 15.5 %   Platelets 240 150 - 400 K/uL   Neutrophils Relative % 65 %   Neutro Abs 5.5 1.7 - 7.7 K/uL   Lymphocytes Relative 23 %   Lymphs Abs 2.0 0.7 - 4.0 K/uL   Monocytes Relative 11 %   Monocytes Absolute 1.0 0.1 - 1.0 K/uL   Eosinophils Relative 1 %   Eosinophils Absolute 0.1 0.0 - 0.7 K/uL   Basophils Relative 0 %   Basophils Absolute 0.0 0.0 - 0.1 K/uL  Troponin I  Result Value Ref Range   Troponin I <0.03 <0.03 ng/mL     EKG Interpretation  Date/Time:  Wednesday November 17 2017 20:58:50 EST Ventricular Rate:  91 PR Interval:  150 QRS Duration: 78 QT Interval:  354 QTC Calculation: 435 R Axis:   74 Text Interpretation:  Normal sinus rhythm Normal ECG No significant change since last tracing Confirmed by Wandra Arthurs [phone]) on 11/17/2017 9:19:33 PM      Radiology Dg Chest 2 View  Result Date: 11/17/2017 CLINICAL DATA:  LEFT chest pain beginning this morning, shortness of breath. EXAM: CHEST  2 VIEW COMPARISON:  Chest  radiograph October 30, 2011 FINDINGS: Cardiac silhouette is mildly enlarged and unchanged. Mediastinal silhouette is unremarkable. No pleural effusions or focal consolidations. Trachea projects midline and there is no pneumothorax. Soft tissue planes and included osseous structures are non-suspicious. IMPRESSION: Mild cardiomegaly, no acute pulmonary process. Electronically Signed   By: Elon Alas M.D.   On: 11/17/2017 22:50  Procedures Procedures (including critical care time)  Medications Ordered in ED Medications - No data to display   Initial Impression / Assessment and Plan / ED Course  I have reviewed the triage vital signs and the nursing notes.  Pertinent labs & imaging results that were available during my care of the patient were reviewed by me and considered in my medical decision making (see chart for details).   Patient presents with complaint of chest pain. She is nontoxic appearing, in no apparent distress, vitals are WNL. Patient's pain is reported as position- worse with movements including twisting/turning and bending forward. Unable to reproduce pain with palpation. Will evaluate with EKG, CXR, troponin, and screening lab work.   Work-up grossly unremarkable. Patient's pain is not exertional, EKG without significant ST/T wave changes, troponin negative > 3 hours after onset of pain, doubt ACS. Patient is PERC negative, doubt pulmonary embolism. Patient's pain is not a tearing sensation, there is no widening of the mediastinum on the CXR, radial pulses are 2+ and symmetric, doubt dissection. Of note there is no significant electrolyte abnormality or anemia.   No definitive etiology to patient's pain at this time. Will treat with naproxen and robaxin- discussed patient is not to drive or operate heavy machinery when taking robaxin. Patient has appointment scheduled for 02/15 with her primary care, discussed keeping this appointment for re-evaluation. I discussed results,  treatment plan, need for PCP follow-up, and return precautions with the patient. Provided opportunity for questions, patient confirmed understanding and is in agreement with plan.   Findings and plan of care discussed with supervising physician Dr. Darl Householder who is in agreement with plan.   Vitals:   11/17/17 2053 11/17/17 2320  BP: 113/74 107/65  Pulse: 89 89  Resp: 18 16  Temp: 97.6 F (36.4 C)   SpO2: 100% 96%    Final Clinical Impressions(s) / ED Diagnoses   Final diagnoses:  Chest pain, unspecified type    ED Discharge Orders        Ordered    naproxen (NAPROSYN) 500 MG tablet  2 times daily     11/17/17 2328    methocarbamol (ROBAXIN) 500 MG tablet  Every 8 hours PRN     11/17/17 2328       Danel Requena, [place] R, PA-C 11/17/17 2348    Drenda Freeze, MD 11/18/17 [phone]

## 2017-11-17 NOTE — ED Triage Notes (Signed)
C/o CP x today-NAD-steady gait

## 2017-11-17 NOTE — ED Notes (Signed)
C/o cp onset this am left side increased w cough, movement  Denies n/c

## 2017-11-19 ENCOUNTER — Encounter: Payer: Self-pay | Admitting: Family Medicine

## 2017-11-19 ENCOUNTER — Ambulatory Visit (INDEPENDENT_AMBULATORY_CARE_PROVIDER_SITE_OTHER): Payer: BLUE CROSS/BLUE SHIELD | Admitting: Family Medicine

## 2017-11-19 ENCOUNTER — Other Ambulatory Visit: Payer: Self-pay

## 2017-11-19 DIAGNOSIS — M940 Chondrocostal junction syndrome [Tietze]: Secondary | ICD-10-CM | POA: Diagnosis not present

## 2017-11-19 NOTE — Patient Instructions (Signed)
Thank you for coming to see me today. It was a pleasure! Today we talked about:   Your chest pain. I believe this is a muscle issue. Please return to the ED if you have worsening pain, shortness of breath, nausea, or pain that radiates down your arms.   Since the naproxen is not working. You should stop that and try ibuprofen 400mg  every 4 hours for up to 2 weeks. Please take this with food to help with stomach discomfort. It was a pleasure meeting you!  Please follow-up with your doctor as needed.  If you have any questions or concerns, please do not hesitate to call the office at [phone].  Take Care,   [place] Blaise Grieshaber, DO

## 2017-11-22 DIAGNOSIS — M940 Chondrocostal junction syndrome [Tietze]: Secondary | ICD-10-CM | POA: Insufficient documentation

## 2017-11-22 NOTE — Assessment & Plan Note (Signed)
Patient presented to ED on 2/13  with complaint of chest pain.  EKG, CXR, troponin, BMP and CBC all WNL at that time. Vitals are WNL. PERC negative, doubt  PE.   Given that patient's pain is reported as position- worse with movements including twisting/turning and bending forward and is reproducable with palpation and not exertional. Costochondritis is most likely. Given naproxen and robaxin at the ED, but patient still not having much relief. Reports naproxen has not worked for her in past. Instructed to stop naproxen and use ibuprofen as this has worked in the past. She is to take it scheduled over the weekend. Given strict return precautions and had lengthy discussion as to what to look for to return to ED.

## 2017-11-22 NOTE — Progress Notes (Signed)
   Subjective:    Patient ID: Sarah Prince, female    DOB: 05-15-1986, 32 y.o.   MRN: [id number]   CC:  HPI:   Chest Pain: seen in ED on 2/13 and given robaxin and naproxen - Reports substernal to left sided chest discomfort that occurs positionally - Patient states if she is bending forward, turning/twisting, coughing, or taking a very deep breath she will have the pain. Able to lay down without pain in proper position. - Describes the pain as a pressing, similar to muscle cramps, like possible muscle spasm type pain.  - Pain does not occur at rest or worsen with exertion.  - Denies nausea, vomiting, dyspnea, or diaphoresis.  Denies leg pain/swelling, hemoptysis, recent surgery/trauma, recent long travel, hormone use, personal hx of cancer, or hx of DVT/PE. Denies injury, heavy lifting, or change in activity.  - Robaxin and naproxen do not appear to be helping - Pain is same as when it started, not better or worse - Patient concerned she may have cancer, but reassured by family hx, hx of never smoking, and lack of any other symptoms other than this acute pain  Smoking status reviewed- never smoker  Review of Systems: Per HPI   Patient Active Problem List   Diagnosis Date Noted  . Costochondritis 11/22/2017  . Skin tag 11/09/2014  . Vaginitis and vulvovaginitis 09/21/2014  . Oligomenorrhea 12/10/2011  . Unspecified vitamin D deficiency 08/18/2010  . SICKLE CELL TRAIT 07/08/2009  . 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 Bowel sounds present Extremities: no edema or cyanosis. WWP. Skin: warm and dry, no rashes noted Neuro: alert and oriented, no focal deficits Psych: normal affect  Assessment & Plan:    Costochondritis Patient presented to ED on 2/13  with complaint of chest pain.  EKG, CXR, troponin, BMP and CBC all WNL at that time. Vitals are WNL. PERC negative, doubt  PE.   Given that patient's pain is reported as position- worse with movements including twisting/turning and bending forward and is reproducable with palpation and not exertional. Costochondritis is most likely. Given naproxen and robaxin at the ED, but patient still not having much relief. Reports naproxen has not worked for her in past. Instructed to stop naproxen and use ibuprofen as this has worked in the past. She is to take it scheduled over the weekend. Given strict return precautions and had lengthy discussion as to what to look for to return to ED.      [place] Myndi Wamble, DO Family Medicine Resident PGY-1

## 2018-03-01 ENCOUNTER — Encounter: Payer: Self-pay | Admitting: Family Medicine

## 2018-03-01 ENCOUNTER — Other Ambulatory Visit: Payer: Self-pay

## 2018-03-01 ENCOUNTER — Ambulatory Visit (INDEPENDENT_AMBULATORY_CARE_PROVIDER_SITE_OTHER): Payer: BLUE CROSS/BLUE SHIELD | Admitting: Family Medicine

## 2018-03-01 VITALS — BP 104/64 | HR 78 | Temp 98.9°F | Wt 145.0 lb

## 2018-03-01 DIAGNOSIS — D352 Benign neoplasm of pituitary gland: Secondary | ICD-10-CM | POA: Diagnosis not present

## 2018-03-01 DIAGNOSIS — D353 Benign neoplasm of craniopharyngeal duct: Secondary | ICD-10-CM

## 2018-03-01 DIAGNOSIS — N979 Female infertility, unspecified: Secondary | ICD-10-CM | POA: Diagnosis not present

## 2018-03-01 NOTE — Assessment & Plan Note (Signed)
Prolactin level checked. F/U with endocrinologist as recommended.

## 2018-03-01 NOTE — Progress Notes (Signed)
  Subjective:     Patient ID: Sarah Prince, female   DOB: Apr 05, 1986, 32 y.o.   MRN: [id number]  HPI Pituitary Adenoma: She has been non-compliant with her medication. She follow-up with endocrinologist every two years. She will like to check her numbers. No headache, no vision change. Infertility:Still having difficulty with conceiving. She is currently undergoing fertility treatment. She can't remember the name of the [facility] but she stated it was center for women. She will like a second opinion.  Current Outpatient Medications on File Prior to Visit  Medication Sig Dispense Refill  . cabergoline (DOSTINEX) 0.5 MG tablet Take 0.5 mg by mouth once a week.    . methocarbamol (ROBAXIN) 500 MG tablet Take 1 tablet (500 mg total) by mouth every 8 (eight) hours as needed for muscle spasms. 30 tablet 0  . naproxen (NAPROSYN) 500 MG tablet Take 1 tablet (500 mg total) by mouth 2 (two) times daily. 30 tablet 0   No current facility-administered medications on file prior to visit.    Past Medical History:  Diagnosis Date  . Preterm delivery   . Prolactinoma ([place])    Vitals:   03/01/18 1129  BP: 104/64  Pulse: 78  Temp: 98.9 F (37.2 C)  TempSrc: Oral  SpO2: 97%  Weight: 145 lb (65.8 kg)     Review of Systems  Respiratory: Negative.   Cardiovascular: Negative.   Gastrointestinal: Negative.   Genitourinary: Negative.   Neurological: Negative.   All other systems reviewed and are negative.      Objective:   Physical Exam  Constitutional: She appears well-developed and well-nourished. No distress.  Cardiovascular: Normal rate, regular rhythm, normal heart sounds and intact distal pulses. Exam reveals no friction rub.  No murmur heard. Pulmonary/Chest: Effort normal and breath sounds normal. No stridor. No respiratory distress.  Abdominal: Soft. Bowel sounds are normal. She exhibits no distension and no mass. There is no tenderness.  Neurological: She is alert. Coordination normal.   Nursing note and vitals reviewed.      Assessment:     Pituitary adenoma Infertility    Plan:     Check problem list

## 2018-03-01 NOTE — Assessment & Plan Note (Signed)
Receiving treatment. However, she requested a second opinion. I am uncertain where she is currently establish. I gave information about [facility]. She is instructed to contact them. I will also place referral.

## 2018-03-01 NOTE — Patient Instructions (Signed)
[place]:  [email or web address]    Infertility Infertility is when you are unable to get pregnant (conceive) after a year of having sex regularly without using birth control. Infertility can also mean that a woman is not able to carry a pregnancy to full term. Both women and men can have fertility problems. What causes infertility? What Causes Infertility in Women? There are many possible causes of infertility in women. For some women, the cause of infertility is not known (unexplained infertility). Infertility can also be linked to more than one cause. Infertility problems in women can be caused by problems with the menstrual cycle or reproductive organs, certain medical conditions, and factors related to lifestyle and age.  Problems with your menstrual cycle can interfere with your ovaries producing eggs (ovulation). This can make it difficult to get pregnant. This includes having a menstrual cycle that is very long, very short, or irregular.  Problems with reproductive organs can include: ? An abnormally narrow cervix or a cervix that does not remain closed during a pregnancy. ? A blockage in your fallopian tubes. ? An abnormally shaped uterus. ? Uterine fibroids. This is a tissue mass (tumor) that can develop on your uterus.  Medical conditions that can affect a woman's fertility include: ? Polycystic ovarian syndrome (PCOS). This is a hormonal disorder that can cause small cysts to grow on your ovaries. This is the most common cause of infertility in women. ? Endometriosis. This is a condition in which the tissue that lines your uterus (endometrium) grows outside of its normal location. ? Primary ovary insufficiency. This is when your ovaries stop producing eggs and hormones before the age of 89. ? Sexually transmitted diseases, such as chlamydia or gonorrhea. These infections can cause scarring in your fallopian tubes. This makes it  difficult for eggs to reach your uterus. ? Autoimmune disorders. These are disorders in which your immune system attacks normal, healthy cells. ? Hormone imbalances.  Other factors include: ? Age. A woman's fertility declines with age, especially after her mid-35s. ? Being under- or overweight. ? Drinking too much alcohol. ? Using drugs. ? Exercising excessively. ? Being exposed to environmental toxins, such as radiation, pesticides, and certain chemicals.  What Causes Infertility in Men? There are many causes of infertility in men. Infertility can be linked to more than one cause. Infertility problems in men can be caused by problems with sperm or the reproductive organs, certain medical conditions, and factors related to lifestyle and age. Some men have unexplained infertility.  Problems with sperm. Infertility can result if there is a problem producing: ? Enough sperm (low sperm count). ? Enough normally-shaped sperm (sperm morphology). ? Sperm that are able to reach the egg (poor motility).  Infertility can also be caused by: ? A problem with hormones. ? Enlarged veins (varicoceles), cysts (spermatoceles), or tumors of the testicles. ? Sexual dysfunction. ? Injury to the testicles. ? A birth defect, such as not having the tubes that carry sperm (vas deferens).  Medical conditions that can affect a man's fertility include: ? Diabetes. ? Cancer treatments, such as chemotherapy or radiation. ? Klinefelter syndrome. This is an inherited genetic disorder. ? Thyroid problems, such as an under- or overactive thyroid. ? Cystic fibrosis. ? Sexually transmitted diseases.  Other factors include: ? Age. A man's fertility declines with age. ? Drinking too much alcohol. ? Using drugs. ? Being exposed to environmental toxins, such as pesticides and lead.  What are the symptoms of infertility? Being unable to  get pregnant after one year of having regular sex without using birth control  is the only sign of infertility. How is infertility diagnosed? In order to be diagnosed with infertility, both partners will have a physical exam. Both partners will also have an extensive medical and sexual history taken. If there is no obvious reason for infertility, additional tests may be done. What Tests Will Women Have? Women may first have tests to check whether they are ovulating each month. The tests may include:  Blood tests to check hormone levels.  An ultrasound of the ovaries. This looks for possible problems on or in the ovaries.  Taking a small sample of the tissue that lines the uterus for examination under a microscope (endometrial biopsy).  Women who are ovulating may have additional tests. These may include:  Hysterosalpingography. ? This is an X-ray of the fallopian tubes and uterus taken after a specific type of dye is injected. ? This test can show the shape of the uterus and whether the fallopian tubes are open.  Laparoscopy. ? In this test, a lighted tube (laparoscope) is used to look for problems in the fallopian tubes and other female organs.  Transvaginal ultrasound. ? This is an imaging test to check for abnormalities of the uterus and ovaries. ? A health care provider can use this test to count the number of follicles on the ovaries.  Hysteroscopy. ? This test involves using a lighted tube to examine the cervix and inside the uterus. ? It is done to find any abnormalities inside the uterus.  What Tests Will Men Have? Tests for men's infertility includes:  Semen tests to check sperm count, morphology, and motility.  Blood tests to check for hormone levels.  Taking a small sample of tissue from inside a testicle (biopsy). This is examined under a microscope.  Blood tests to check for genetic abnormalities (genetic testing).  How are women treated for infertility? Treatment depends on the cause of infertility. Most cases of infertility in women are  treated with medicine or surgery.  Women may take medicine to: ? Correct ovulation problems. ? Treat other health conditions, such as PCOS.  Surgery may be done to: ? Repair damage to the ovaries, fallopian tubes, cervix, or uterus. ? Remove growths from the uterus. ? Remove scar tissue from the uterus, pelvis, or other female organs.  How are men treated for infertility? Treatment depends on the cause of infertility. Most cases of infertility in men are treated with medicine or surgery.  Men may take medicine to: ? Correct hormone problems. ? Treat other health conditions. ? Treat sexual dysfunction.  Surgery may be done to: ? Remove blockages in the reproductive tract. ? Correct other structural problems of the reproductive tract.  What is assisted reproductive technology? Assisted reproductive technology (ART) refers to all treatments and procedures that combine eggs and sperm outside the body to try to help a couple conceive. ART is often combined with fertility drugs to stimulate ovulation. Sometimes ART is done using eggs retrieved from another woman's body (donor eggs) or from previously frozen fertilized eggs (embryos). There are different types of ART. These include:  Intrauterine insemination (IUI). ? In this procedure, sperm is placed directly into a woman's uterus with a long, thin tube. ? This may be most effective for infertility caused by sperm problems, including low sperm count and low motility. ? Can be used in combination with fertility drugs.  In vitro fertilization (IVF). ? This is often done when  a woman's fallopian tubes are blocked or when a man has low sperm counts. ? Fertility drugs stimulate the ovaries to produce multiple eggs. Once mature, these eggs are removed from the body and combined with the sperm to be fertilized. ? These fertilized eggs are then placed in the woman's uterus.  This information is not intended to replace advice given to you by  your health care provider. Make sure you discuss any questions you have with your health care provider. Document Released: 09/24/2003 Document Revised: 02/21/2016 Document Reviewed: 06/06/2014 Elsevier Interactive Patient Education  2018 [employer].

## 2018-03-02 ENCOUNTER — Telehealth: Payer: Self-pay | Admitting: Family Medicine

## 2018-03-02 LAB — PROLACTIN: PROLACTIN: 36.9 ng/mL — AB (ref 4.8–23.3)

## 2018-03-02 NOTE — Telephone Encounter (Signed)
Prolactin level slightly elevated. Result discussed with her. I recommended F/U with endo.  She stated she prefers to restart her meds today and recheck in 2 -3 months. If still elevated, then she will contact her endocrinologist's office.   She will return in 2-3 months for lab work. She will call our office to schedule appointment ahead of time.

## 2018-05-09 NOTE — Telephone Encounter (Addendum)
error 

## 2018-05-10 ENCOUNTER — Other Ambulatory Visit: Payer: Self-pay

## 2018-05-10 ENCOUNTER — Encounter: Payer: Self-pay | Admitting: Family Medicine

## 2018-05-10 ENCOUNTER — Ambulatory Visit (INDEPENDENT_AMBULATORY_CARE_PROVIDER_SITE_OTHER): Payer: BLUE CROSS/BLUE SHIELD | Admitting: Family Medicine

## 2018-05-10 VITALS — BP 100/62 | HR 88 | Temp 98.5°F | Wt 147.0 lb

## 2018-05-10 DIAGNOSIS — N809 Endometriosis, unspecified: Secondary | ICD-10-CM

## 2018-05-10 DIAGNOSIS — D353 Benign neoplasm of craniopharyngeal duct: Secondary | ICD-10-CM

## 2018-05-10 DIAGNOSIS — N979 Female infertility, unspecified: Secondary | ICD-10-CM | POA: Diagnosis not present

## 2018-05-10 DIAGNOSIS — E559 Vitamin D deficiency, unspecified: Secondary | ICD-10-CM

## 2018-05-10 DIAGNOSIS — D688 Other specified coagulation defects: Secondary | ICD-10-CM | POA: Diagnosis not present

## 2018-05-10 DIAGNOSIS — D352 Benign neoplasm of pituitary gland: Secondary | ICD-10-CM

## 2018-05-10 LAB — POCT URINE PREGNANCY: Preg Test, Ur: NEGATIVE

## 2018-05-10 NOTE — Assessment & Plan Note (Signed)
No acute change. She is compliant with meds. Recheck Prolactin level today.

## 2018-05-10 NOTE — Assessment & Plan Note (Signed)
Recheck lab today.

## 2018-05-10 NOTE — Progress Notes (Signed)
  Subjective:     Patient ID: Sarah Prince, female   DOB: [birth year], 32 y.o.   MRN: [id number]  HPI Surg Clearance: She is scheduled for laparoscopic evaluation and treatment for endometriosis on August 12th as part of her infertility treatment. Denies any GU concern. Pituitary Adenoma: She will like to recheck her prolactin level. She is now compliant with her meds. Vti D Def: Not on meds.  Current Outpatient Medications on File Prior to Visit  Medication Sig Dispense Refill  . cabergoline (DOSTINEX) 0.5 MG tablet Take 0.5 mg by mouth once a week.    . cholecalciferol (VITAMIN D) 1000 units tablet Take 1,000 Units by mouth daily.     No current facility-administered medications on file prior to visit.    Past Medical History:  Diagnosis Date  . Preterm delivery    Vitals:   05/10/18 0914  BP: 100/62  Pulse: 88  Temp: 98.5 F (36.9 C)  TempSrc: Oral  SpO2: 98%  Weight: 147 lb (66.7 kg)     Review of Systems  Respiratory: Negative.   Cardiovascular: Negative.   Gastrointestinal: Negative.   Genitourinary: Negative.   Neurological: Negative.   All other systems reviewed and are negative.      Objective:   Physical Exam  Constitutional: She is oriented to person, place, and time. She appears well-nourished. No distress.  Cardiovascular: Normal rate, regular rhythm and normal heart sounds.  No murmur heard. Pulmonary/Chest: Effort normal and breath sounds normal. No respiratory distress. She has no wheezes.  Abdominal: Soft. Bowel sounds are normal. She exhibits no distension and no mass. There is no tenderness.  Musculoskeletal: Normal range of motion. She exhibits no edema.  Neurological: She is alert and oriented to person, place, and time. No cranial nerve deficit.  Nursing note and vitals reviewed.      Assessment:     Infertility Pituitary Adenoma Vit D def    Plan:     Check problem list

## 2018-05-10 NOTE — Patient Instructions (Signed)
Endometriosis Endometriosis is a condition in which the tissue that lines the uterus (endometrium) grows outside of its normal location. The tissue may grow in many locations close to the uterus, but it commonly grows on the ovaries, fallopian tubes, vagina, or bowel. When the uterus sheds the endometrium every menstrual cycle, there is bleeding wherever the endometrial tissue is located. This can cause pain because blood is irritating to tissues that are not normally exposed to it. What are the causes? The cause of endometriosis is not known. What increases the risk? You may be more likely to develop endometriosis if you:  Have a family history of endometriosis.  Have never given birth.  Started your period at age 10 or younger.  Have high levels of estrogen in your body.  Were exposed to a certain medicine (diethylstilbestrol) before you were born (in utero).  Had low birth weight.  Were born as a twin, triplet, or other multiple.  Have a BMI of less than 25. BMI is an estimate of body fat and is calculated from height and weight.  What are the signs or symptoms? Often, there are no symptoms of this condition. If you do have symptoms, they may:  Vary depending on where your endometrial tissue is growing.  Occur during your menstrual period (most common) or midcycle.  Come and go, or you may go months with no symptoms at all.  Stop with menopause.  Symptoms may include:  Pain in the back or abdomen.  Heavier bleeding during periods.  Pain during sex.  Painful bowel movements.  Infertility.  Pelvic pain.  Bleeding more than once a month.  How is this diagnosed? This condition is diagnosed based on your symptoms and a physical exam. You may have tests, such as:  Blood tests and urine tests. These may be done to help rule out other possible causes of your symptoms.  Ultrasound, to look for abnormal tissues.  An X-ray of the lower bowel (barium enema).  An  ultrasound that is done through the vagina (transvaginally).  CT scan.  MRI.  Laparoscopy. In this procedure, a lighted, pencil-sized instrument called a laparoscope is inserted into your abdomen through an incision. The laparoscope allows your health care provider to look at the organs inside your body and check for abnormal tissue to confirm the diagnosis. If abnormal tissue is found, your health care provider may remove a small piece of tissue (biopsy) to be examined under a microscope.  How is this treated? Treatment for this condition may include:  Medicines to relieve pain, such as NSAIDs.  Hormone therapy. This involves using artificial (synthetic) hormones to reduce endometrial tissue growth. Your health care provider may recommend using a hormonal form of birth control, or other medicines.  Surgery. This may be done to remove abnormal endometrial tissue. ? In some cases, tissue may be removed using a laparoscope and a laser (laparoscopic laser treatment). ? In severe cases, surgery may be done to remove the fallopian tubes, uterus, and ovaries (hysterectomy).  Follow these instructions at home:  Take over-the-counter and prescription medicines only as told by your health care provider.  Do not drive or use heavy machinery while taking prescription pain medicine.  Try to avoid activities that cause pain, including sexual activity.  Keep all follow-up visits as told by your health care provider. This is important. Contact a health care provider if:  You have pain in the area between your hip bones (pelvic area) that occurs: ? Before, during, or   after your period. ? In between your period and gets worse during your period. ? During or after sex. ? With bowel movements or urination, especially during your period.  You have problems getting pregnant.  You have a fever. Get help right away if:  You have severe pain that does not get better with medicine.  You have severe  nausea and vomiting, or you cannot eat without vomiting.  You have pain that affects only the lower, right side of your abdomen.  You have abdominal pain that gets worse.  You have abdominal swelling.  You have blood in your stool. This information is not intended to replace advice given to you by your health care provider. Make sure you discuss any questions you have with your health care provider. Document Released: 09/18/2000 Document Revised: 06/26/2016 Document Reviewed: 02/22/2016 Elsevier Interactive Patient Education  2018 [employer]  

## 2018-05-10 NOTE — Assessment & Plan Note (Signed)
Lap exam schedule to r/o endometriosis. Lab work done per Surg clearance request. I will complete form after result is available.

## 2018-05-11 ENCOUNTER — Telehealth: Payer: Self-pay | Admitting: Family Medicine

## 2018-05-11 LAB — CMP14+EGFR
ALK PHOS: 60 IU/L (ref 39–117)
ALT: 9 IU/L (ref 0–32)
AST: 10 IU/L (ref 0–40)
Albumin/Globulin Ratio: 1.5 (ref 1.2–2.2)
Albumin: 4.2 g/dL (ref 3.5–5.5)
BUN/Creatinine Ratio: 13 (ref 9–23)
BUN: 10 mg/dL (ref 6–20)
Bilirubin Total: 0.2 mg/dL (ref 0.0–1.2)
CALCIUM: 9.1 mg/dL (ref 8.7–10.2)
CO2: 22 mmol/L (ref 20–29)
CREATININE: 0.79 mg/dL (ref 0.57–1.00)
Chloride: 106 mmol/L (ref 96–106)
GFR calc Af Amer: 116 mL/min/{1.73_m2} (ref >59)
GFR, EST NON AFRICAN AMERICAN: 101 mL/min/{1.73_m2} (ref >59)
GLUCOSE: 89 mg/dL (ref 65–99)
Globulin, Total: 2.8 g/dL (ref 1.5–4.5)
Potassium: 4.5 mmol/L (ref 3.5–5.2)
SODIUM: 141 mmol/L (ref 134–144)
Total Protein: 7 g/dL (ref 6.0–8.5)

## 2018-05-11 LAB — CBC WITH DIFFERENTIAL/PLATELET
BASOS ABS: 0 10*3/uL (ref 0.0–0.2)
Basos: 1 %
EOS (ABSOLUTE): 0.1 10*3/uL (ref 0.0–0.4)
EOS: 3 %
Hematocrit: 38.5 % (ref 34.0–46.6)
Hemoglobin: 12.5 g/dL (ref 11.1–15.9)
IMMATURE GRANULOCYTES: 0 %
Immature Grans (Abs): 0 10*3/uL (ref 0.0–0.1)
LYMPHS ABS: 1.3 10*3/uL (ref 0.7–3.1)
Lymphs: 31 %
MCH: 26.9 pg (ref 26.6–33.0)
MCHC: 32.5 g/dL (ref 31.5–35.7)
MCV: 83 fL (ref 79–97)
MONOS ABS: 0.5 10*3/uL (ref 0.1–0.9)
Monocytes: 13 %
NEUTROS PCT: 52 %
Neutrophils Absolute: 2.1 10*3/uL (ref 1.4–7.0)
PLATELETS: 225 10*3/uL (ref 150–450)
RBC: 4.64 x10E6/uL (ref 3.77–5.28)
RDW: 14.1 % (ref 12.3–15.4)
WBC: 4.1 10*3/uL (ref 3.4–10.8)

## 2018-05-11 LAB — PT AND PTT
APTT: 28 s (ref 24–33)
INR: 1.1 (ref 0.8–1.2)
PROTHROMBIN TIME: 11.2 s (ref 9.1–12.0)

## 2018-05-11 LAB — PROLACTIN: PROLACTIN: 9.9 ng/mL (ref 4.8–23.3)

## 2018-05-11 LAB — VITAMIN D 25 HYDROXY (VIT D DEFICIENCY, FRACTURES): Vit D, 25-Hydroxy: 22.7 ng/mL — ABNORMAL LOW (ref 30.0–100.0)

## 2018-05-11 NOTE — Telephone Encounter (Addendum)
I discussed lab result with her. Prolactin level normal. Continue Carbergolin. Endocrinology f/u recommended for counseling and recommendation. She requested that I forward her result to her endocrinologist and the fertility specialist. Surg clearance form completed and faxed with her lab result.  Community message regarding result sent to her endocrinologist.  Note: I recommended restarting her OTC Vit D supplement. Some improvement since her last level check. She agreed with the plan.

## 2018-10-07 IMAGING — DX DG CHEST 2V
2 series · 2 of 2 positions shown · non-contrast
Comparison: Chest radiograph October 30, 2011

CLINICAL DATA: LEFT chest pain beginning this morning, shortness of
breath.

EXAM:
CHEST  2 VIEW

[chest pa]
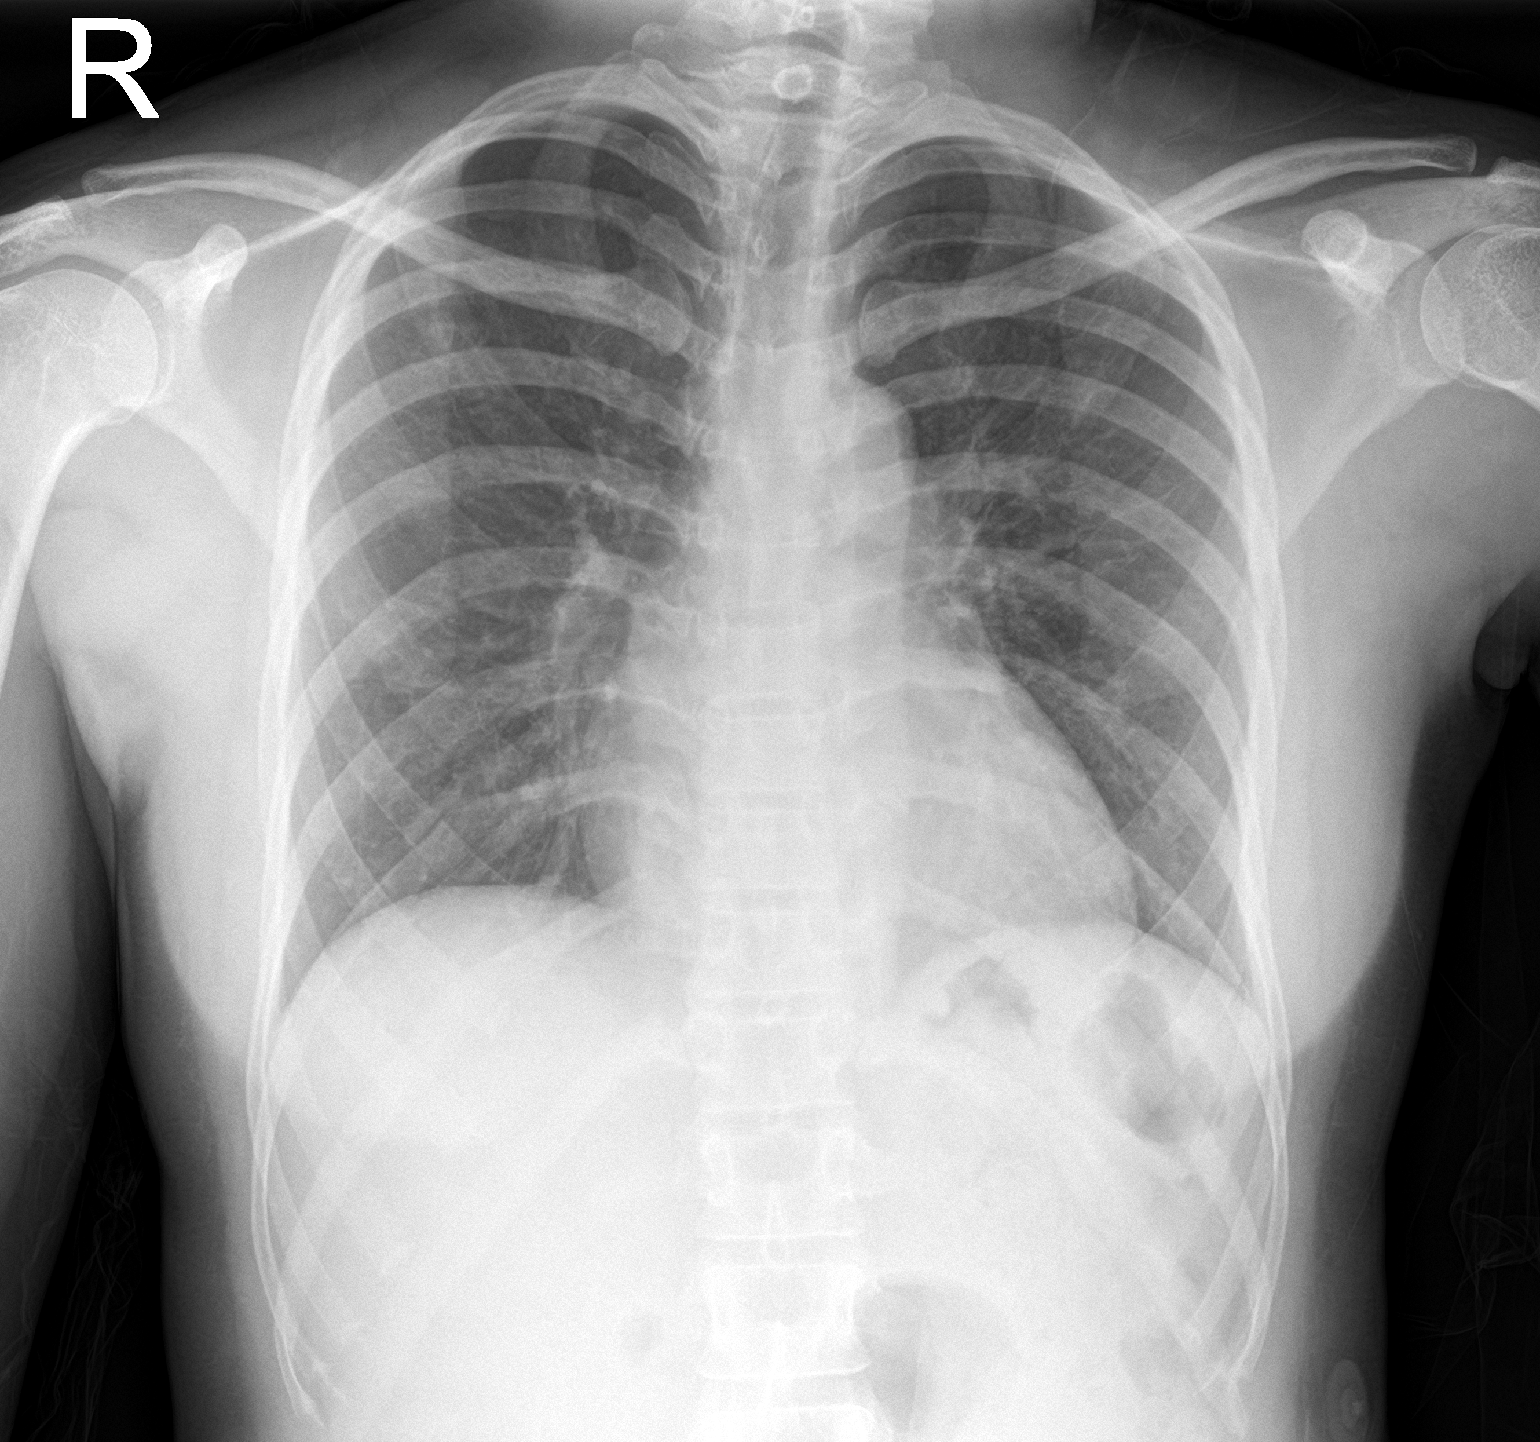

[chest lat]
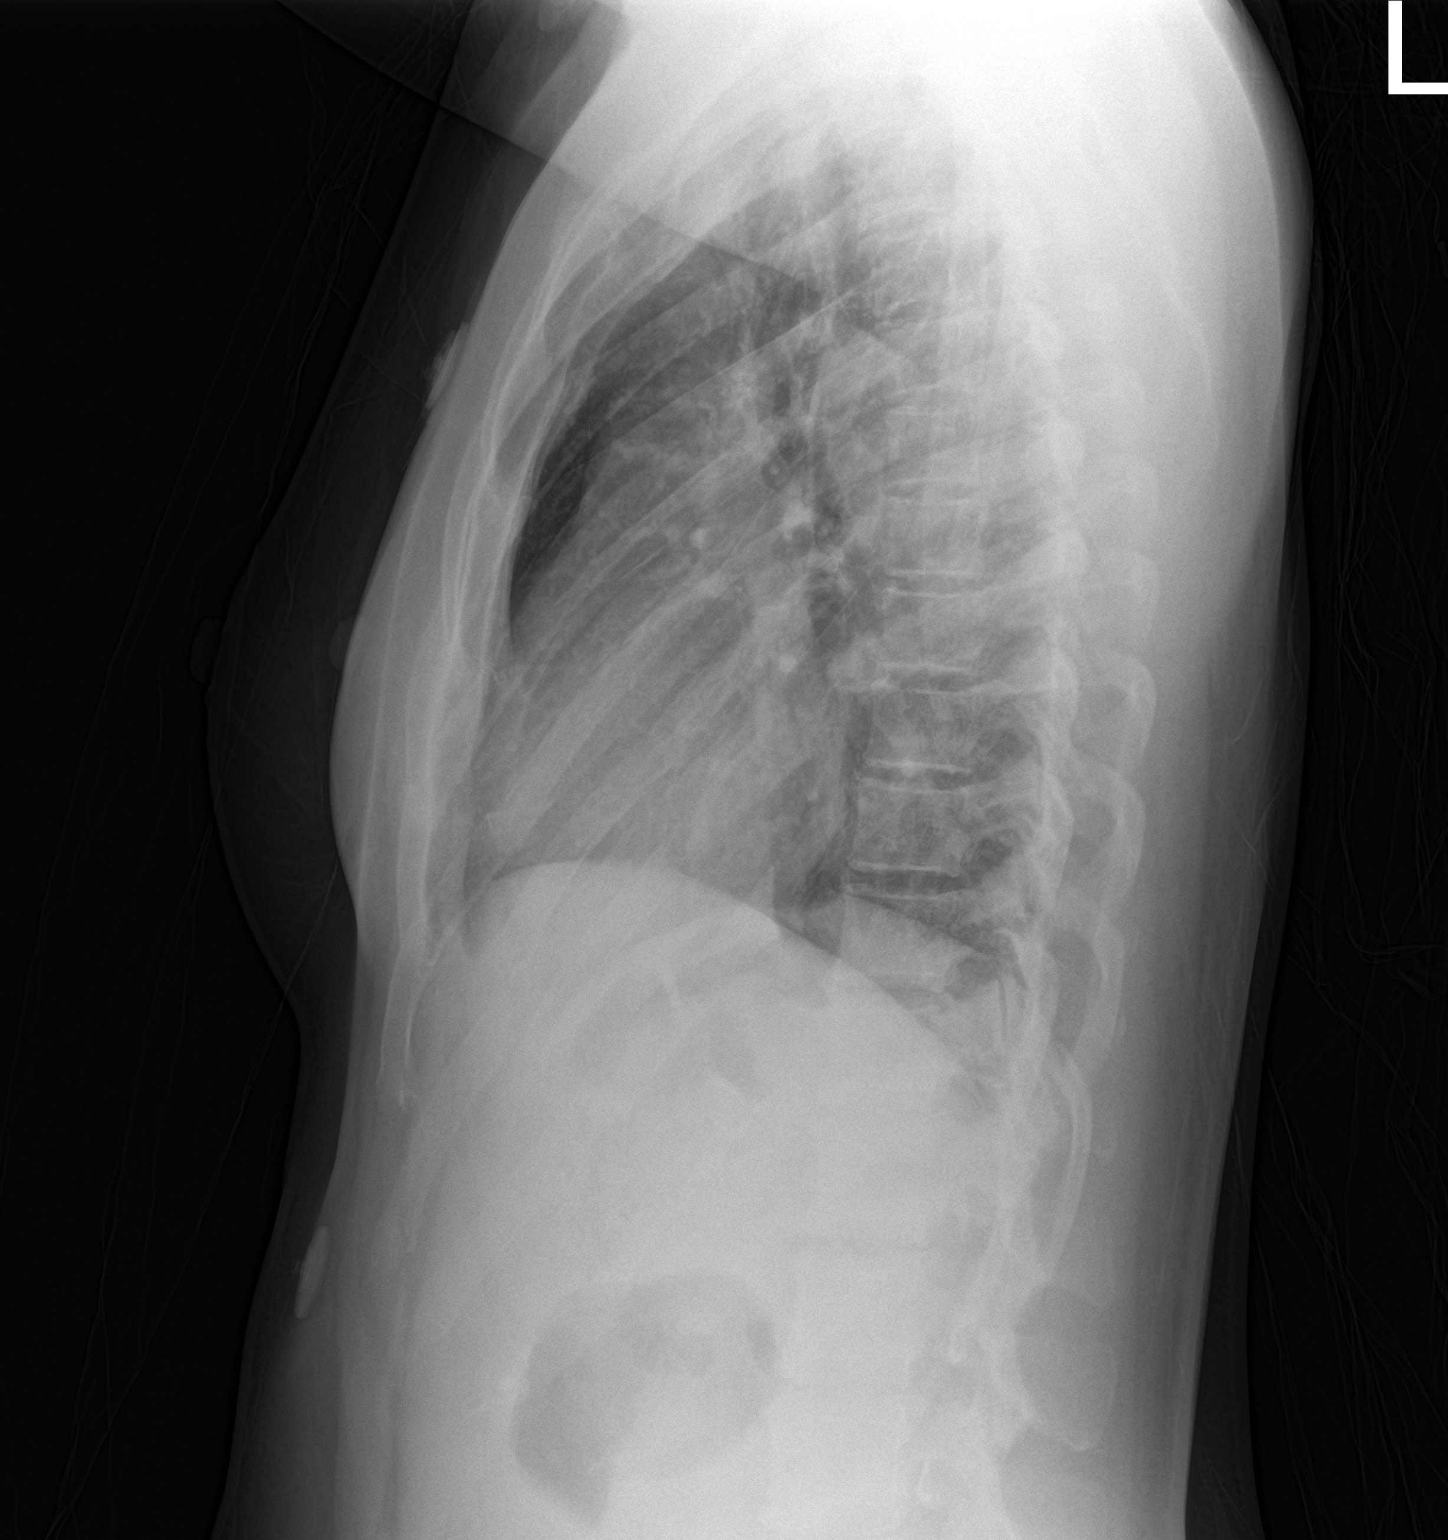

[2 of 2 positions shown; findings below may reference images not displayed]

FINDINGS: Cardiac silhouette is mildly enlarged and unchanged. Mediastinal
silhouette is unremarkable.. No pleural effusions or focal
consolidations. Trachea projects midline and there is no
pneumothorax. Soft tissue planes and included osseous structures are
non-suspicious.
IMPRESSION: Mild cardiomegaly, no acute pulmonary process.

## 2019-01-09 ENCOUNTER — Other Ambulatory Visit (INDEPENDENT_AMBULATORY_CARE_PROVIDER_SITE_OTHER): Payer: BLUE CROSS/BLUE SHIELD | Admitting: Family Medicine

## 2019-01-09 DIAGNOSIS — R0781 Pleurodynia: Secondary | ICD-10-CM | POA: Diagnosis not present

## 2019-01-09 MED ORDER — OXYCODONE HCL 5 MG PO TABS: 2.5000 mg | ORAL_TABLET | ORAL | 0 refills | Status: DC | PRN

## 2019-01-09 NOTE — Telephone Encounter (Signed)
[facility] Telemedicine Visit  Patient consented to have visit conducted via telephone.  Encounter participants: Patient: Sarah Prince  Provider: Sherren Mocha   Others (if applicable):   Chief Complaint: Chest pain  HPI:  CC: chest pain  Covid-qualifying symptoms - Fever: no           - Rigors: no  - Acute soaking sweats: no  - Shortness of breath at rest (worse from baseline): no  - Shortness of breath with exertion (worse from baseline): no  - Function (impairment in iADLs, ADLs): no  -- Comorbidities: Pituitary Patient Active Problem List   Diagnosis Date Noted  . Infertility, female 03/01/2018  . Oligomenorrhea 12/10/2011  . Vitamin D deficiency 08/18/2010  . SICKLE CELL TRAIT 07/08/2009  . 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     - Immunostatus: immunocompetent  CoVid19 Risk factors Travel to CoVid19 high risk areas in last 14 days from date of symptom onset: no  Exposure to laboratory confirmed [facility] patient in last 14 days: no  Exposure to a Person Under Investigation (PUI) in last 3 days: no  Known exposure to any person, including health care worker, who has had close contact with a laboratory-confirmed CoVid19 patient within last 14 days: no  Occupation: has been working from home last 2 weeks   CHEST PAIN  Location: left chest  Quality: left  Duration: 2 days  Onset (rest, exertion): 2 days ago Radiation: no  Better with: Vicks vapor rub on chest  Worse with: nothing  PMH: Similar symptoms of chest pain in 11/2017,  Negative cardiac workup with treatment with short course opioids and rest.  It resolved spontaneously.   Symptoms History of Trauma/lifting: no  Nausea/vomiting: no  Diaphoresis: no  Shortness of breath: no  Pleuritic: yes  Cough: no  Edema: no  Orthopnea: no  PND: no  Dizziness: no  Palpitations: no  Syncope: no  Indigestion: no   Red Flags History of CVD: no Worse with exertion: no  Recent  Immobility: no  Cancer history: no  Tearing/radiation to back: no     SH: No smoking     ROS: see HPI  Pertinent PMHx: No CVD  Exam:  Respiratory: speaking in full sentence, no audible wheeze, voice prosodic  Assessment/Plan:  Chest pain differential (06/19/14): in descending order of prevalence - Chest wall pain - Pleurisy, pleuritis  Plan  Symptomatic care with Ibuprofen or APAP, breakthru with oxycodone 2.5 mg q4h prn.  Rest.  Monitor.  Call if condition or symptoms worsens.     Time spent on phone with patient: 51minutes

## 2019-01-10 ENCOUNTER — Telehealth: Payer: BLUE CROSS/BLUE SHIELD

## 2020-03-14 ENCOUNTER — Encounter: Payer: Self-pay | Admitting: Family Medicine

## 2020-03-15 ENCOUNTER — Ambulatory Visit (INDEPENDENT_AMBULATORY_CARE_PROVIDER_SITE_OTHER): Payer: BC Managed Care – PPO | Admitting: Family Medicine

## 2020-03-15 ENCOUNTER — Other Ambulatory Visit (HOSPITAL_COMMUNITY)
Admission: RE | Admit: 2020-03-15 | Discharge: 2020-03-15 | Disposition: A | Discharge status: Home / Self Care | Payer: BC Managed Care – PPO | Source: Ambulatory Visit | Attending: Family Medicine | Admitting: Family Medicine

## 2020-03-15 ENCOUNTER — Other Ambulatory Visit: Payer: Self-pay

## 2020-03-15 VITALS — BP 110/70 | HR 105 | Ht 66.14 in | Wt 164.0 lb

## 2020-03-15 DIAGNOSIS — E785 Hyperlipidemia, unspecified: Secondary | ICD-10-CM

## 2020-03-15 DIAGNOSIS — Z0000 Encounter for general adult medical examination without abnormal findings: Secondary | ICD-10-CM

## 2020-03-15 DIAGNOSIS — E559 Vitamin D deficiency, unspecified: Secondary | ICD-10-CM

## 2020-03-15 DIAGNOSIS — Z124 Encounter for screening for malignant neoplasm of cervix: Secondary | ICD-10-CM | POA: Diagnosis not present

## 2020-03-15 DIAGNOSIS — Z1159 Encounter for screening for other viral diseases: Secondary | ICD-10-CM

## 2020-03-15 DIAGNOSIS — E1169 Type 2 diabetes mellitus with other specified complication: Secondary | ICD-10-CM

## 2020-03-15 DIAGNOSIS — D352 Benign neoplasm of pituitary gland: Secondary | ICD-10-CM

## 2020-03-15 DIAGNOSIS — R0602 Shortness of breath: Secondary | ICD-10-CM

## 2020-03-15 DIAGNOSIS — I495 Sick sinus syndrome: Secondary | ICD-10-CM

## 2020-03-15 MED ORDER — MEFLOQUINE HCL 250 MG PO TABS
250.0000 mg | ORAL_TABLET | ORAL | 0 refills | Status: DC
Start: 2020-03-15 — End: 2020-04-15

## 2020-03-15 NOTE — Progress Notes (Signed)
Subjective:     Sarah Prince is a 34 y.o. female and is here for a comprehensive physical exam. The patient reports problems - Skin rash and occasional SOB with climbing stairs. Rash on her left LL x 3 weeks. Looks like it is getting better. SOB x 7 months with very minimal activities. Occasional leg swelling. Currently asymptomatic. Also mentioned she is going to [place] with her family and will like to get vaccine information as well as antimalaria prophylaxis.  Social History   Socioeconomic History   Marital status: Married    Spouse name: Not on file   Number of children: Not on file   Years of education: Not on file   Highest education level: Not on file  Occupational History   Not on file  Tobacco Use   Smoking status: Never Smoker   Smokeless tobacco: Never Used  Substance and Sexual Activity   Alcohol use: No   Drug use: No   Sexual activity: Not on file  Other Topics Concern   Not on file  Social History Narrative   Pt lives with her son [name]) born in November 2009 and father of baby Edgar Frisk Diarassoaba).  She Has associated, working as a Quarry manager.  Student, working on [employer] at [employer]. Plans on career in business.  Parents live in [place].  Moved to [place] in 2005, travels back to [place], has also previously lived in [place].   Social Determinants of Health   Financial Resource Strain:    Difficulty of Paying Living Expenses:   Food Insecurity:    Worried About Charity fundraiser in the Last Year:    Arboriculturist in the Last Year:   Transportation Needs:    Film/video editor (Medical):    Lack of Transportation (Non-Medical):   Physical Activity:    Days of Exercise per Week:    Minutes of Exercise per Session:   Stress:    Feeling of Stress :   Social Connections:    Frequency of Communication with Friends and Family:    Frequency of Social Gatherings with Friends and Family:    Attends Religious Services:    Active Member  of Clubs or Organizations:    Attends Music therapist:    Marital Status:   Intimate Partner Violence:    Fear of Current or Ex-Partner:    Emotionally Abused:    Physically Abused:    Sexually Abused:    Health Maintenance  Topic Date Due   Hepatitis C Screening  Never done   PAP SMEAR-Modifier  02/28/2019   INFLUENZA VACCINE  05/05/2020   TETANUS/TDAP  10/09/2022   HIV Screening  Completed    The following portions of the patient's history were reviewed and updated as appropriate: allergies, current medications, past family history, past medical history, past social history, past surgical history and problem list.  Review of Systems Pertinent items noted in HPI and remainder of comprehensive ROS otherwise negative.   Objective:    BP 110/70    Pulse (!) 105    Ht 5' 6.14" (1.68 m)    Wt 164 lb (74.4 kg)    SpO2 97%    BMI 26.36 kg/m  General appearance: alert, cooperative and appears stated age Head: Normocephalic, without obvious abnormality, atraumatic Eyes: conjunctivae/corneas clear. PERRL, EOM's intact. Fundi benign. Ears: normal TM's and external ear canals both ears Throat: lips, mucosa, and tongue normal; teeth and gums normal Lungs: clear to auscultation bilaterally Heart:  regular rate and rhythm, S1, S2 normal, no murmur, click, rub or gallop Abdomen: soft, non-tender; bowel sounds normal; no masses,  no organomegaly Pelvic: cervix normal in appearance, external genitalia normal, no adnexal masses or tenderness, no cervical motion tenderness, rectovaginal septum normal, uterus normal size, shape, and consistency, vagina normal without discharge and Chaperon: Leonia Corona Extremities: extremities normal, atraumatic, no cyanosis or edema Pulses: 2+ and symmetric Skin: reticulate/netlike hyperpigmented lesion on her LL, a few inches above her ankle B/L more on the left Neurologic: Alert and oriented X 3, normal strength and tone. Normal  symmetric reflexes. Normal coordination and gait      Office Visit from 03/15/2020 in [facility]  PHQ-9 Total Score 6     No active depression, just feels stressed. Monitor closely. Assessment:    Healthy female exam. Gyne exam SOB Skin rash Travel medicine  Plan:  Annual physical Wellness exam and counseling completed. PAP test completed as well. I will contact her with the result. Hep C screening dicussed and obtained during this visit. FLP checked due to previous elevated lipids.   SOB No abnormal findings on respiratory exam, no leg edema. Give hx of SOB and recurrent leg swelling, we will order ECHO to assess her LVEF. CMA will contact to schedule ECHO. She is aware of this.  Skin lesion: ?? Livedo reticularis vs contact dermatitis. Med list reviewed. None known to cause skin rash. Seems to be clearing. Check Cmet, TSH. I will contact her with result. Otherwise monitor closely since it is improving.  Vit D deficiency: rechecked.  Travel encounter: Travel to [place]. Yellow fever and Typhoid vaccine recommended on CDC website. Instruction given to schedule health department vaccine appointment. She plan to leave for [place] on Jult 10th and return August 24th. She will need Malaria Prophylaxis starting two weeks prior to her travel and 4 weeks after arrival. A total of 13 weeks treatment. Mefloquine escribed. I counseled her about this medication. All questions were answered.

## 2020-03-15 NOTE — Patient Instructions (Signed)
Hepatitis C Testing Why am I having this test? Hepatitis C testing is done to check for a liver infection caused by the hepatitis C virus (HCV). You may have one or more hepatitis C tests done:  To help your health care provider diagnose HCV infection, if you have possible signs or symptoms of infection.  To check for infection, if you may have been exposed to HCV.  To find the cause of long-term (chronic) liver disease or abnormal liver function test results.  To see if you have had hepatitis C in the past. Hepatitis C is usually diagnosed with three blood tests:  Anti-HCV test, also called the HCV antibody test.  HCV RNA test.  HCV genotype test. If you are diagnosed with a current (active) HCV infection, you may have another test done to help monitor your condition during treatment. This test is called the quantitative HCV RNA test. What is being tested? Each HCV test measures the amounts of different substances in your blood.  The anti-HCV test checks for proteins that your body makes to fight HCV (antibodies). If you have antibodies to HCV, it means you have been infected with hepatitis C. It does not necessarily mean that you have an active infection.  The HCV RNA test checks for genetic material from HCV. This test is done if your HCV antibody test is positive and your health care provider wants to find out if you have an active infection.  The HCV genotype test. This test identifies the type (genotype) of virus you have.  The quantitative HCV RNA test measures the amount of virus in your blood (viral load). What kind of sample is taken?  A blood sample is required for HCV tests. It is usually collected by inserting a needle into a blood vessel. How are the results reported?  Anti-HCV test results are reported as either positive or negative for HCV antibodies.  HCV RNA test results are reported as either positive or negative for HCV genetic material.  HCV genotype test  results are reported as which genotype of the virus you have. Genotypes are numbered 1 through 6.  Quantitative HCV RNA test results are reported as a number that indicates your viral load. This is given as international units of virus per milliliter of blood (IU/mL). ? A result of 800,000 IU/mL or greater is considered a high viral load. ? A result of less than 800,000 IU/mL is considered a low viral load. Sometimes, results from the anti-HCV test or the HCV RNA test may report that:  HCV antibodies or genetic material are present when they are not present (false-positive result).  HCV antibodies or genetic material are not present when they are present (false-negative result). What do the results mean? For the anti-HCV test:  A negative result may mean that you have not been infected with HCV. You may need to have this test done again to confirm this result.  A positive result may mean that you have an active HCV infection, or that you have been infected with HCV in the past. An HCV infection may not cause any symptoms, and your body may get rid of the virus without treatment. For the HCV RNA test:  A negative result means that you do not have an active HCV infection.  A positive result means that you have an active HCV infection. For the HCV genotype test, knowing the specific genotype you have will help your health care provider recommend the treatment that will work best for   you. The quantitative HCV RNA test gives your health care provider an idea of how well your treatment is working.  If your viral load is high, you may need different treatment.  If your viral load is low, your treatment may be working effectively.  You may have this test repeated to continue to monitor your treatment. Talk with your health care provider about what your results mean. Questions to ask your health care provider Ask your health care provider, or the department that is doing the test:  When will  my results be ready?  How will I get my results?  What are my treatment options?  What other tests do I need?  What are my next steps? Summary  The hepatitis C testing is done to check for a liver infection caused by the hepatitis C virus (HCV).  Hepatitis C is usually diagnosed with three blood tests and monitored with one test.  A blood sample is required for these tests. It is usually collected by inserting a needle into a blood vessel.  Your test results for both the anti-HCV test and the HCV RNA test will be reported as either positive or negative. This information is not intended to replace advice given to you by your health care provider. Make sure you discuss any questions you have with your health care provider. Document Revised: 09/03/2017 Document Reviewed: 08/09/2017 Elsevier Patient Education  2020 Elsevier Inc.  

## 2020-03-16 LAB — CMP14+EGFR
ALT: 14 IU/L (ref 0–32)
AST: 15 IU/L (ref 0–40)
Albumin/Globulin Ratio: 1.7 (ref 1.2–2.2)
Albumin: 4.6 g/dL (ref 3.8–4.8)
Alkaline Phosphatase: 82 IU/L (ref 48–121)
BUN/Creatinine Ratio: 13 (ref 9–23)
BUN: 9 mg/dL (ref 6–20)
Bilirubin Total: 0.2 mg/dL (ref 0.0–1.2)
CO2: 21 mmol/L (ref 20–29)
Calcium: 9.9 mg/dL (ref 8.7–10.2)
Chloride: 106 mmol/L (ref 96–106)
Creatinine, Ser: 0.71 mg/dL (ref 0.57–1.00)
GFR calc Af Amer: 130 mL/min/{1.73_m2} (ref >59)
GFR calc non Af Amer: 113 mL/min/{1.73_m2} (ref >59)
Globulin, Total: 2.7 g/dL (ref 1.5–4.5)
Glucose: 91 mg/dL (ref 65–99)
Potassium: 4 mmol/L (ref 3.5–5.2)
Sodium: 143 mmol/L (ref 134–144)
Total Protein: 7.3 g/dL (ref 6.0–8.5)

## 2020-03-16 LAB — LIPID PANEL
Chol/HDL Ratio: 3.6 ratio (ref 0.0–4.4)
Cholesterol, Total: 233 mg/dL — ABNORMAL HIGH (ref 100–199)
HDL: 65 mg/dL (ref >39)
LDL Chol Calc (NIH): 157 mg/dL — ABNORMAL HIGH (ref 0–99)
Triglycerides: 67 mg/dL (ref 0–149)
VLDL Cholesterol Cal: 11 mg/dL (ref 5–40)

## 2020-03-16 LAB — TSH: TSH: 0.627 u[IU]/mL (ref 0.450–4.500)

## 2020-03-16 LAB — VITAMIN D 25 HYDROXY (VIT D DEFICIENCY, FRACTURES): Vit D, 25-Hydroxy: 35.5 ng/mL (ref 30.0–100.0)

## 2020-03-16 LAB — PROLACTIN: Prolactin: 8.6 ng/mL (ref 4.8–23.3)

## 2020-03-16 LAB — HEPATITIS C ANTIBODY: Hep C Virus Ab: <0.1 s/co ratio (ref 0.0–0.9)

## 2020-03-18 ENCOUNTER — Telehealth: Payer: Self-pay | Admitting: *Deleted

## 2020-03-18 ENCOUNTER — Telehealth: Payer: Self-pay | Admitting: Family Medicine

## 2020-03-18 NOTE — Telephone Encounter (Signed)
-----   Message from Kinnie Feil, MD sent at 03/15/2020  3:43 PM EDT ----- Please contact patient to help her schedule ECHO appointment. Thanks.

## 2020-03-18 NOTE — Telephone Encounter (Signed)
HIPAA compliant callback message left.  If she calls, please relay the info below. I also sent it to her MyChart account.  ................................................................................................  Hello Sarah Prince,  Your lab test result looks good, except for mildly elevated cholesterol test. Given your age and no comorbid conditions such as diabetes and HTN, we can monitor off medications.   For now lifestyle modification is recommended, by lowering your intake of saturated fat and trans fat, (i.e., limiting your intake of red meat and dairy products made with whole milk, limiting fried food, and cooking with healthy oils, such as vegetable oil). Eat more fruits, vegetables, whole grains, poultry, fish, nuts, and nontropical vegetable oils, while limiting red and processed meats, sodium, and sugar-sweetened foods and beverages.   Get at least 150 minutes of moderate-intensity aerobic exercise a week

## 2020-03-18 NOTE — Telephone Encounter (Signed)
Patient has been scheduled for 03-21-20 at 9:15am.  [facility]

## 2020-03-18 NOTE — Telephone Encounter (Signed)
Called patient and she prefers mornings on Thursdays or Fridays.  LM for echo lab to call me back so we can get this scheduled.  Bricyn Labrada,CMA

## 2020-03-21 ENCOUNTER — Ambulatory Visit (HOSPITAL_COMMUNITY)
Admission: RE | Admit: 2020-03-21 | Discharge: 2020-03-21 | Disposition: A | Discharge status: Home / Self Care | Payer: BC Managed Care – PPO | Source: Ambulatory Visit | Attending: Family Medicine | Admitting: Family Medicine

## 2020-03-21 ENCOUNTER — Telehealth: Payer: Self-pay | Admitting: Family Medicine

## 2020-03-21 ENCOUNTER — Other Ambulatory Visit: Payer: Self-pay

## 2020-03-21 DIAGNOSIS — R0602 Shortness of breath: Secondary | ICD-10-CM | POA: Diagnosis not present

## 2020-03-21 DIAGNOSIS — R0600 Dyspnea, unspecified: Secondary | ICD-10-CM | POA: Insufficient documentation

## 2020-03-21 LAB — CYTOLOGY - PAP
Comment: NEGATIVE
Comment: NEGATIVE
Comment: NEGATIVE
Diagnosis: NEGATIVE
HPV 16: NEGATIVE
HPV 18 / 45: NEGATIVE
High risk HPV: POSITIVE — AB

## 2020-03-21 NOTE — Telephone Encounter (Signed)
PAP result discussed.   Negative malignancy.  + HPV low risk  Recommend repeat in a year. All questions were answered.  Patient will follow-up in a year for PAP.

## 2020-03-21 NOTE — Progress Notes (Signed)
  Echocardiogram 2D Echocardiogram has been performed.  Sarah Prince 03/21/2020, 10:11 AM

## 2020-03-22 ENCOUNTER — Telehealth: Payer: Self-pay | Admitting: Family Medicine

## 2020-03-22 DIAGNOSIS — R931 Abnormal findings on diagnostic imaging of heart and coronary circulation: Secondary | ICD-10-CM

## 2020-03-22 DIAGNOSIS — R600 Localized edema: Secondary | ICD-10-CM

## 2020-03-22 DIAGNOSIS — R0609 Other forms of dyspnea: Secondary | ICD-10-CM

## 2020-03-22 NOTE — Telephone Encounter (Signed)
ECHO report discussed.  Significant for right atrial pressure of 97mmHg. Pulmonary pressure was immeasurable.  Unclear if this is related to her SOB and leg swelling.  Will refer to Cards for further eval.  She agreed with the plan.

## 2020-04-13 DIAGNOSIS — R0600 Dyspnea, unspecified: Secondary | ICD-10-CM | POA: Insufficient documentation

## 2020-04-13 DIAGNOSIS — M7989 Other specified soft tissue disorders: Secondary | ICD-10-CM

## 2020-04-13 HISTORY — DX: Dyspnea, unspecified: R06.00

## 2020-04-13 HISTORY — DX: Other specified soft tissue disorders: M79.89

## 2020-04-13 NOTE — Progress Notes (Signed)
Cardiology Office Note   Date:  04/15/2020   ID:  Sarah Prince, DOB Jul 03, 1986, MRN [id number]  PCP:  Kinnie Feil, MD  Cardiologist:   No primary care provider on file. Referring:  Kinnie Feil, MD   Chief Complaint  Patient presents with  . Shortness of Breath      History of Present Illness: Sarah Prince is a 34 y.o. female who was referred by Kinnie Feil, MDfor evaluation of DOE and leg swelling.  She says she has been short of breath for about a year.  She notices this mildly climbing a flight of stairs.  She feels a little more exhausted than she should when walking up the stairs but she can keep ongoing.  She has had some mild lower extremity feet swelling.  She otherwise has not had any cardiac issues.  She does not describe PND or orthopnea.  She has had 2 episodes of "spasm" in the past in her chest that have been evaluated with emergency room visits but she has never been diagnosed with any cardiac issues.  She can do her physical activity without bringing on any chest discomfort, neck or arm discomfort.  She does have some increased heart rates sometimes what she describes to stress and coffee.  Because of her complaints she did have an echo in June demonstrated an EF of 60 - 65%.  There is mild RV dilatation.  However, the echo was otherwise unremarkable.  There was no evidence of pulmonary hypertension or tricuspid regurgitation.  Past Medical History:  Diagnosis Date  . Preterm delivery       Current Outpatient Medications  Medication Sig Dispense Refill  . cabergoline (DOSTINEX) 0.5 MG tablet Take 0.5 mg by mouth once a week.    . cholecalciferol (VITAMIN D) 1000 units tablet Take 1,000 Units by mouth daily.     No current facility-administered medications for this visit.    Allergies:   Patient has no known allergies.    Social History:  The patient  reports that she has never smoked. She has never used smokeless tobacco. She reports that she does  not drink alcohol and does not use drugs.   Family History:  The patient's family history includes Diabetes in her mother.    ROS:  Please see the history of present illness.   Otherwise, review of systems are positive for none.   All other systems are reviewed and negative.    PHYSICAL EXAM: VS:  BP 112/84   Pulse 90   Ht 5\' 7"  (1.702 m)   Wt 161 lb (73 kg)   SpO2 99%   BMI 25.22 kg/m  , BMI Body mass index is 25.22 kg/m. GENERAL:  Well appearing HEENT:  Pupils equal round and reactive, fundi not visualized, oral mucosa unremarkable NECK:  No jugular venous distention, waveform within normal limits, carotid upstroke brisk and symmetric, no bruits, no thyromegaly LYMPHATICS:  No cervical, inguinal adenopathy LUNGS:  Clear to auscultation bilaterally BACK:  No CVA tenderness CHEST:  Unremarkable HEART:  PMI not displaced or sustained,S1 and S2 within normal limits, no S3, no S4, no clicks, no rubs, no murmurs ABD:  Flat, positive bowel sounds normal in frequency in pitch, no bruits, no rebound, no guarding, no midline pulsatile mass, no hepatomegaly, no splenomegaly EXT:  2 plus pulses throughout, trace edema, no cyanosis no clubbing SKIN:  No rashes no nodules NEURO:  Cranial nerves II through XII grossly intact, motor grossly intact  throughout [facility]:  Cognitively intact, oriented to person place and time    EKG:  EKG is ordered today. The ekg ordered today demonstrates sinus rhythm, rate 90, axis within normal limits, intervals within normal limits, no acute ST-T wave changes.   Recent Labs: 03/15/2020: ALT 14; BUN 9; Creatinine, Ser 0.71; Potassium 4.0; Sodium 143; TSH 0.627    Lipid Panel    Component Value Date/Time   CHOL 233 (H) 03/15/2020 1011   TRIG 67 03/15/2020 1011   HDL 65 03/15/2020 1011   CHOLHDL 3.6 03/15/2020 1011   CHOLHDL 3.5 Ratio 08/18/2010 2017   VLDL 16 08/18/2010 2017   LDLCALC 157 (H) 03/15/2020 1011      Wt Readings from Last 3 Encounters:   04/15/20 161 lb (73 kg)  03/15/20 164 lb (74.4 kg)  05/10/18 147 lb (66.7 kg)      Other studies Reviewed: Additional studies/ records that were reviewed today include: Echo. Review of the above records demonstrates:  Please see elsewhere in the note.     ASSESSMENT AND PLAN:  LEG SWELLING:   The leg swelling is not accompanied by any other evidence of RV dysfunction or pulmonary hypertension.  I do not strongly suspect this is related to the mild RV dilatation that is seen.  However because of this and the shortness of breath I will check a BNP level.  I will see her back in 3 months follow-up as below.  She will have follow-up study as below.  RV ENLARGEMENT: We went over this abnormal echo finding in detail.  This is very mild and not accompanied by any physical findings consistent with pulmonary hypertension or reason for RV dysfunction.  I will follow up with an echo in 1 year.  DYSPNEA: She has some mild dyspnea.  I will check the BNP level as above.  I will see her back in 3 months and if this is worse I will consider cardiopulmonary stress testing.  COVID EDUCATION: She has had her vaccinations.  Current medicines are reviewed at length with the patient today.  The patient does not have concerns regarding medicines.  The following changes have been made:  no change  Labs/ tests ordered today include:   Orders Placed This Encounter  Procedures  . Brain natriuretic peptide  . EKG 12-Lead  . ECHOCARDIOGRAM COMPLETE     Disposition:   FU with me in 3 months.     Signed, Minus Breeding, MD  04/15/2020 1:31 PM    [place]

## 2020-04-15 ENCOUNTER — Ambulatory Visit (INDEPENDENT_AMBULATORY_CARE_PROVIDER_SITE_OTHER): Payer: BC Managed Care – PPO | Admitting: Cardiology

## 2020-04-15 ENCOUNTER — Other Ambulatory Visit: Payer: Self-pay

## 2020-04-15 ENCOUNTER — Encounter: Payer: Self-pay | Admitting: Cardiology

## 2020-04-15 VITALS — BP 112/84 | HR 90 | Ht 67.0 in | Wt 161.0 lb

## 2020-04-15 DIAGNOSIS — M7989 Other specified soft tissue disorders: Secondary | ICD-10-CM | POA: Diagnosis not present

## 2020-04-15 DIAGNOSIS — R0600 Dyspnea, unspecified: Secondary | ICD-10-CM

## 2020-04-15 DIAGNOSIS — Z7189 Other specified counseling: Secondary | ICD-10-CM | POA: Diagnosis not present

## 2020-04-15 NOTE — Patient Instructions (Signed)
Medication Instructions:  The current medical regimen is effective;  continue present plan and medications.  *If you need a refill on your cardiac medications before your next appointment, please call your pharmacy*   Lab Work: BNP today   If you have labs (blood work) drawn today and your tests are completely normal, you will receive your results only by: Marland Kitchen MyChart Message (if you have MyChart) OR . A paper copy in the mail If you have any lab test that is abnormal or we need to change your treatment, we will call you to review the results.   Testing/Procedures: Echocardiogram (1 year)  - Your physician has requested that you have an echocardiogram. Echocardiography is a painless test that uses sound waves to create images of your heart. It provides your doctor with information about the size and shape of your heart and how well your heart's chambers and valves are working. This procedure takes approximately one hour. There are no restrictions for this procedure. This will be performed at our [facility] location - [address].    Follow-Up: At [facility], you and your health needs are our priority.  As part of our continuing mission to provide you with exceptional heart care, we have created designated Provider Care Teams.  These Care Teams include your primary Cardiologist (physician) and Advanced Practice Providers (APPs -  Physician Assistants and Nurse Practitioners) who all work together to provide you with the care you need, when you need it.  We recommend signing up for the patient portal called "MyChart".  Sign up information is provided on this After Visit Summary.  MyChart is used to connect with patients for Virtual Visits (Telemedicine).  Patients are able to view lab/test results, encounter notes, upcoming appointments, etc.  Non-urgent messages can be sent to your provider as well.   To learn more about what you can do with MyChart, go to  [email or web address].    Your next appointment:   3 month(s)  The format for your next appointment:   In Person  Provider:   Minus Breeding, MD

## 2020-04-16 LAB — BRAIN NATRIURETIC PEPTIDE: BNP: 15.1 pg/mL (ref 0.0–100.0)

## 2020-07-24 DIAGNOSIS — R931 Abnormal findings on diagnostic imaging of heart and coronary circulation: Secondary | ICD-10-CM | POA: Insufficient documentation

## 2020-07-24 NOTE — Progress Notes (Deleted)
Cardiology Office Note   Date:  07/24/2020   ID:  Sarah Prince, DOB [birth year], MRN [id number]  PCP:  Kinnie Feil, MD  Cardiologist:   No primary care provider on file. Referring:  Kinnie Feil, MD   No chief complaint on file.     History of Present Illness: Sarah Prince is a 34 y.o. female who was referred by Kinnie Feil, MD for evaluation of DOE and leg swelling.    BNP was normal.  She did have an echo in June demonstrated an EF of 60 - 65%.  There is mild RV dilatation.  However, the echo was otherwise unremarkable.  There was no evidence of pulmonary hypertension or tricuspid regurgitation.***    ***She says she has been short of breath for about a year.  She notices this mildly climbing a flight of stairs.  She feels a little more exhausted than she should when walking up the stairs but she can keep ongoing.  She has had some mild lower extremity feet swelling.  She otherwise has not had any cardiac issues.  She does not describe PND or orthopnea.  She has had 2 episodes of "spasm" in the past in her chest that have been evaluated with emergency room visits but she has never been diagnosed with any cardiac issues.  She can do her physical activity without bringing on any chest discomfort, neck or arm discomfort.  She does have some increased heart rates sometimes what she describes to stress and coffee.  Because of her complaints   Past Medical History:  Diagnosis Date  . Preterm delivery       Current Outpatient Medications  Medication Sig Dispense Refill  . cabergoline (DOSTINEX) 0.5 MG tablet Take 0.5 mg by mouth once a week.    . cholecalciferol (VITAMIN D) 1000 units tablet Take 1,000 Units by mouth daily.     No current facility-administered medications for this visit.    Allergies:   Patient has no known allergies.    ROS:  Please see the history of present illness.   Otherwise, review of systems are positive for ***   All other systems are  reviewed and negative.    PHYSICAL EXAM: VS:  There were no vitals taken for this visit. , BMI There is no height or weight on file to calculate BMI. GENERAL:  Well appearing NECK:  No jugular venous distention, waveform within normal limits, carotid upstroke brisk and symmetric, no bruits, no thyromegaly LUNGS:  Clear to auscultation bilaterally CHEST:  Unremarkable HEART:  PMI not displaced or sustained,S1 and S2 within normal limits, no S3, no S4, no clicks, no rubs, *** murmurs ABD:  Flat, positive bowel sounds normal in frequency in pitch, no bruits, no rebound, no guarding, no midline pulsatile mass, no hepatomegaly, no splenomegaly EXT:  2 plus pulses throughout, no edema, no cyanosis no clubbing    ***GENERAL:  Well appearing HEENT:  Pupils equal round and reactive, fundi not visualized, oral mucosa unremarkable NECK:  No jugular venous distention, waveform within normal limits, carotid upstroke brisk and symmetric, no bruits, no thyromegaly LYMPHATICS:  No cervical, inguinal adenopathy LUNGS:  Clear to auscultation bilaterally BACK:  No CVA tenderness CHEST:  Unremarkable HEART:  PMI not displaced or sustained,S1 and S2 within normal limits, no S3, no S4, no clicks, no rubs, no murmurs ABD:  Flat, positive bowel sounds normal in frequency in pitch, no bruits, no rebound, no guarding, no midline pulsatile mass, no  hepatomegaly, no splenomegaly EXT:  2 plus pulses throughout, trace edema, no cyanosis no clubbing SKIN:  No rashes no nodules NEURO:  Cranial nerves II through XII grossly intact, motor grossly intact throughout PSYCH:  Cognitively intact, oriented to person place and time    EKG:  EKG is *** ordered today. The ekg ordered today demonstrates sinus rhythm, rate ***, axis within normal limits, intervals within normal limits, no acute ST-T wave changes.   Recent Labs: 03/15/2020: ALT 14; BUN 9; Creatinine, Ser 0.71; Potassium 4.0; Sodium 143; TSH 0.627 04/15/2020:  BNP 15.1    Lipid Panel    Component Value Date/Time   CHOL 233 (H) 03/15/2020 1011   TRIG 67 03/15/2020 1011   HDL 65 03/15/2020 1011   CHOLHDL 3.6 03/15/2020 1011   CHOLHDL 3.5 Ratio 08/18/2010 2017   VLDL 16 08/18/2010 2017   LDLCALC 157 (H) 03/15/2020 1011      Wt Readings from Last 3 Encounters:  04/15/20 161 lb (73 kg)  03/15/20 164 lb (74.4 kg)  05/10/18 147 lb (66.7 kg)      Other studies Reviewed: Additional studies/ records that were reviewed today include: *** Review of the above records demonstrates:  Please see elsewhere in the note.     ASSESSMENT AND PLAN:  LEG SWELLING:   *** The leg swelling is not accompanied by any other evidence of RV dysfunction or pulmonary hypertension.  I do not strongly suspect this is related to the mild RV dilatation that is seen.  However because of this and the shortness of breath I will check a BNP level.  I will see her back in 3 months follow-up as below.  She will have follow-up study as below.  RV ENLARGEMENT:   *** We went over this abnormal echo finding in detail.  This is very mild and not accompanied by any physical findings consistent with pulmonary hypertension or reason for RV dysfunction.  I will follow up with an echo in 1 year.  DYSPNEA:   ***  She has some mild dyspnea.  I will check the BNP level as above.  I will see her back in 3 months and if this is worse I will consider cardiopulmonary stress testing.  COVID EDUCATION: She has had her vaccinations.  Current medicines are reviewed at length with the patient today.  The patient does not have concerns regarding medicines.  The following changes have been made:  ***  Labs/ tests ordered today include: ***  No orders of the defined types were placed in this encounter.    Disposition:   FU with me in *** months.     Signed, Minus Breeding, MD  07/24/2020 7:43 PM    [facility]

## 2020-07-25 ENCOUNTER — Ambulatory Visit: Payer: Self-pay | Admitting: Cardiology

## 2020-07-25 ENCOUNTER — Telehealth: Payer: Self-pay

## 2020-07-25 DIAGNOSIS — M7989 Other specified soft tissue disorders: Secondary | ICD-10-CM

## 2020-07-25 DIAGNOSIS — R931 Abnormal findings on diagnostic imaging of heart and coronary circulation: Secondary | ICD-10-CM

## 2020-07-25 NOTE — Telephone Encounter (Signed)
Called pt to remind her of her appt with Dr. Percival Spanish today per his request. Left VM.

## 2020-09-11 ENCOUNTER — Telehealth: Payer: Self-pay | Admitting: Family Medicine

## 2020-09-11 NOTE — Telephone Encounter (Signed)
Called to schedule vaccine appointment. She stated she got her COVID 19 and flu shot.  Flu shot: At work I.e [employer] on 07/14/20  [place] vaccines at [employer] on 12/19/19 and 01/09/20

## 2020-09-24 ENCOUNTER — Ambulatory Visit (INDEPENDENT_AMBULATORY_CARE_PROVIDER_SITE_OTHER): Payer: Self-pay

## 2020-09-24 ENCOUNTER — Other Ambulatory Visit: Payer: Self-pay

## 2020-09-24 DIAGNOSIS — Z23 Encounter for immunization: Secondary | ICD-10-CM

## 2020-09-24 NOTE — Progress Notes (Signed)
   Covid-19 Vaccination Clinic  Name:  Sarah Prince    MRN: [id number] DOB: 1986/07/10  09/24/2020  [name] was observed post Covid-19 immunization for 15 minutes without incident. She was provided with Vaccine Information Sheet and instruction to access the V-Safe system.   [name] was instructed to call 911 with any severe reactions post vaccine: Marland Kitchen Difficulty breathing  . Swelling of face and throat  . A fast heartbeat  . A bad rash all over body  . Dizziness and weakness   Booster administered LD without complication.

## 2021-04-18 ENCOUNTER — Encounter (HOSPITAL_COMMUNITY): Payer: Self-pay | Admitting: Cardiology

## 2021-04-18 ENCOUNTER — Other Ambulatory Visit (HOSPITAL_COMMUNITY): Payer: BC Managed Care – PPO

## 2021-04-18 NOTE — Progress Notes (Unsigned)
Patient ID: Sarah Prince, female   DOB: Apr 17, 1986, 35 y.o.   MRN: [id number]  Verified appointment "no show" status with Mollie at 1:15pm.

## 2021-11-27 ENCOUNTER — Encounter: Payer: Self-pay | Admitting: Family Medicine

## 2021-11-27 DIAGNOSIS — I517 Cardiomegaly: Secondary | ICD-10-CM | POA: Insufficient documentation

## 2021-11-28 ENCOUNTER — Encounter: Payer: Self-pay | Admitting: Family Medicine

## 2021-12-02 ENCOUNTER — Encounter: Payer: Self-pay | Admitting: Family Medicine

## 2021-12-10 ENCOUNTER — Other Ambulatory Visit (HOSPITAL_COMMUNITY)
Admission: RE | Admit: 2021-12-10 | Discharge: 2021-12-10 | Disposition: A | Discharge status: Home / Self Care | Payer: 59 | Source: Ambulatory Visit | Attending: Family Medicine | Admitting: Family Medicine

## 2021-12-10 ENCOUNTER — Other Ambulatory Visit: Payer: Self-pay

## 2021-12-10 ENCOUNTER — Ambulatory Visit (INDEPENDENT_AMBULATORY_CARE_PROVIDER_SITE_OTHER): Payer: Self-pay | Admitting: Family Medicine

## 2021-12-10 ENCOUNTER — Encounter: Payer: Self-pay | Admitting: Family Medicine

## 2021-12-10 VITALS — BP 121/82 | HR 83 | Ht 67.0 in | Wt 159.2 lb

## 2021-12-10 DIAGNOSIS — D352 Benign neoplasm of pituitary gland: Secondary | ICD-10-CM

## 2021-12-10 DIAGNOSIS — E559 Vitamin D deficiency, unspecified: Secondary | ICD-10-CM

## 2021-12-10 DIAGNOSIS — Z124 Encounter for screening for malignant neoplasm of cervix: Secondary | ICD-10-CM | POA: Diagnosis not present

## 2021-12-10 DIAGNOSIS — N898 Other specified noninflammatory disorders of vagina: Secondary | ICD-10-CM

## 2021-12-10 DIAGNOSIS — E785 Hyperlipidemia, unspecified: Secondary | ICD-10-CM

## 2021-12-10 DIAGNOSIS — D229 Melanocytic nevi, unspecified: Secondary | ICD-10-CM

## 2021-12-10 DIAGNOSIS — Z0000 Encounter for general adult medical examination without abnormal findings: Secondary | ICD-10-CM

## 2021-12-10 LAB — POCT WET PREP (WET MOUNT)
Clue Cells Wet Prep Whiff POC: NEGATIVE
Trichomonas Wet Prep HPF POC: ABSENT

## 2021-12-10 MED ORDER — FLUCONAZOLE 150 MG PO TABS: 150.0000 mg | ORAL_TABLET | Freq: Once | ORAL | 0 refills | Status: AC

## 2021-12-10 MED ORDER — NORGESTIM-ETH ESTRAD TRIPHASIC 0.18/0.215/0.25 MG-25 MCG PO TABS: 1.0000 | ORAL_TABLET | Freq: Every day | ORAL | 11 refills | Status: DC

## 2021-12-10 NOTE — Progress Notes (Signed)
? ?Subjective:  ?  ? Sarah Prince is a 36 y.o. female and is here for a comprehensive physical exam. The patient reports no problems - she wishes to get her prolactin level checked and an STD screen. She does not want HIV or rpr check. ?Sexually active with her husband. Not currently on any contraceptives, but will like to get on one now. She tried to conceive in the past with no success.  ?LMP:11/24/21, which flows for  4-5 days. Regular and monthly. ? ?Skin: Also c/o scattered brown moles around her neck. She requested a derm referral.  ? ?Social History  ? ?Socioeconomic History  ? Marital status: Married  ?  Spouse name: Not on file  ? Number of children: Not on file  ? Years of education: Not on file  ? Highest education level: Not on file  ?Occupational History  ? Not on file  ?Tobacco Use  ? Smoking status: Never  ? Smokeless tobacco: Never  ?Substance and Sexual Activity  ? Alcohol use: No  ? Drug use: No  ? Sexual activity: Not on file  ?Other Topics Concern  ? Not on file  ?Social History Narrative  ? Pt lives with her son [name]) born in November 2009 and daughter born in 2014.  and father of baby [name]).  Works in business.  Degree in business.    Parents live in [place].  Moved to [place] in 2005, travels back to [place], has also previously lived in [place].  ? ?Social Determinants of Health  ? ?Financial Resource Strain: Not on file  ?Food Insecurity: Not on file  ?Transportation Needs: Not on file  ?Physical Activity: Not on file  ?Stress: Not on file  ?Social Connections: Not on file  ?Intimate Partner Violence: Not on file  ? ?Health Maintenance  ?Topic Date Due  ? PAP SMEAR-Modifier  03/15/2021  ? COVID-19 Vaccine (5 - Booster for [place] series) 12/26/2021 (Originally 08/26/2021)  ? INFLUENZA VACCINE  01/02/2022 (Originally 05/05/2021)  ? TETANUS/TDAP  10/09/2022  ? Hepatitis C Screening  Completed  ? HIV Screening  Completed  ? HPV VACCINES  Aged Out  ? ? ?The following portions  of the patient's history were reviewed and updated as appropriate: allergies, current medications, past family history, past medical history, past social history, past surgical history, and problem list. ? ?Review of Systems ?Pertinent items noted in HPI and remainder of comprehensive ROS otherwise negative.  ? ?Objective:  ? ? BP 121/82   Pulse 83   Ht '5\' 7"'$  (1.702 m)   Wt 159 lb 3.2 oz (72.2 kg)   LMP 11/24/2021   SpO2 100%   BMI 24.93 kg/m?  ?General appearance: alert and cooperative ?Head: Normocephalic, without obvious abnormality, atraumatic ?Eyes: conjunctivae/corneas clear. PERRL, EOM's intact. Fundi benign. ?Ears: normal TM's and external ear canals both ears ?Throat: lips, mucosa, and tongue normal; teeth and gums normal ?Lungs: clear to auscultation bilaterally ?Heart: regular rate and rhythm, S1, S2 normal, no murmur, click, rub or gallop ?Abdomen: soft, non-tender; bowel sounds normal; no masses,  no organomegaly ?Pelvic: cervix normal in appearance, external genitalia normal, and Thick whitish discharge - Chaperone [name]) ?Extremities: extremities normal, atraumatic, no cyanosis or edema ?Skin: Skin color, texture, turgor normal. Small scattered, brown moles <0.05 mm around her neck ?Neurologic: Alert and oriented X 3, normal strength and tone. Normal symmetric reflexes. Normal coordination and gait  ?  ?Assessment:  ? ? Healthy female exam. ?PAP ?Skin mole   ?  ?  Plan:  ?Physical exam mostly normal ?Patient is generally healthy. ?PAP completed today with STD screen. ?Wet prep shows yeast infection - called her after visit with result and treated her with Diflucan. ?Flu shot and COVID offered. She declined both ? ?Prolactin adenoma:  ?Prolactin level checked at her request. ?She is aware that I will not be managing the test result and she need to f/u with her endocrinologist. ?She stated she will make an appointment. ? 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 ? ?NB: She requested FLP although I advised she will likely not need treated due to age less than 40 if LDL is elevated. ?Diet and exercise discussed. ?FLP ordered. ?  ?See After Visit Summary for Counseling Recommendations  ? ? ?

## 2021-12-10 NOTE — Patient Instructions (Signed)
Preventive Care 61-34 Years Old, Female ?Preventive care refers to lifestyle choices and visits with your health care provider that can promote health and wellness. Preventive care visits are also called wellness exams. ?What can I expect for my preventive care visit? ?Counseling ?During your preventive care visit, your health care provider may ask about your: ?Medical history, including: ?Past medical problems. ?Family medical history. ?Pregnancy history. ?Current health, including: ?Menstrual cycle. ?Method of birth control. ?Emotional well-being. ?Home life and relationship well-being. ?Sexual activity and sexual health. ?Lifestyle, including: ?Alcohol, nicotine or tobacco, and drug use. ?Access to firearms. ?Diet, exercise, and sleep habits. ?Work and work Statistician. ?Sunscreen use. ?Safety issues such as seatbelt and bike helmet use. ?Physical exam ?Your health care provider may check your: ?Height and weight. These may be used to calculate your BMI (body mass index). BMI is a measurement that tells if you are at a healthy weight. ?Waist circumference. This measures the distance around your waistline. This measurement also tells if you are at a healthy weight and may help predict your risk of certain diseases, such as type 2 diabetes and high blood pressure. ?Heart rate and blood pressure. ?Body temperature. ?Skin for abnormal spots. ?What immunizations do I need? ?Vaccines are usually given at various ages, according to a schedule. Your health care provider will recommend vaccines for you based on your age, medical history, and lifestyle or other factors, such as travel or where you work. ?What tests do I need? ?Screening ?Your health care provider may recommend screening tests for certain conditions. This may include: ?Pelvic exam and Pap test. ?Lipid and cholesterol levels. ?Diabetes screening. This is done by checking your blood sugar (glucose) after you have not eaten for a while (fasting). ?Hepatitis B  test. ?Hepatitis C test. ?HIV (human immunodeficiency virus) test. ?STI (sexually transmitted infection) testing, if you are at risk. ?BRCA-related cancer screening. This may be done if you have a family history of breast, ovarian, tubal, or peritoneal cancers. ?Talk with your health care provider about your test results, treatment options, and if necessary, the need for more tests. ?Follow these instructions at home: ?Eating and drinking ? ?Eat a healthy diet that includes fresh fruits and vegetables, whole grains, lean protein, and low-fat dairy products. ?Take vitamin and mineral supplements as recommended by your health care provider. ?Do not drink alcohol if: ?Your health care provider tells you not to drink. ?You are pregnant, may be pregnant, or are planning to become pregnant. ?If you drink alcohol: ?Limit how much you have to 0-1 drink a day. ?Know how much alcohol is in your drink. In the [place] one drink equals one 12 oz bottle of beer (355 mL), one 5 oz glass of wine (148 mL), or one 1? oz glass of hard liquor (44 mL). ?Lifestyle ?Brush your teeth every morning and night with fluoride toothpaste. Floss one time each day. ?Exercise for at least 30 minutes 5 or more days each week. ?Do not use any products that contain nicotine or tobacco. These products include cigarettes, chewing tobacco, and vaping devices, such as e-cigarettes. If you need help quitting, ask your health care provider. ?Do not use drugs. ?If you are sexually active, practice safe sex. Use a condom or other form of protection to prevent STIs. ?If you do not wish to become pregnant, use a form of birth control. If you plan to become pregnant, see your health care provider for a prepregnancy visit. ?Find healthy ways to manage stress, such as: ?Meditation, yoga,  or listening to music. ?Journaling. ?Talking to a trusted person. ?Spending time with friends and family. ?Minimize exposure to UV radiation to reduce your risk of skin  cancer. ?Safety ?Always wear your seat belt while driving or riding in a vehicle. ?Do not drive: ?If you have been drinking alcohol. Do not ride with someone who has been drinking. ?If you have been using any mind-altering substances or drugs. ?While texting. ?When you are tired or distracted. ?Wear a helmet and other protective equipment during sports activities. ?If you have firearms in your house, make sure you follow all gun safety procedures. ?Seek help if you have been physically or sexually abused. ?What's next? ?Go to your health care provider once a year for an annual wellness visit. ?Ask your health care provider how often you should have your eyes and teeth checked. ?Stay up to date on all vaccines. ?This information is not intended to replace advice given to you by your health care provider. Make sure you discuss any questions you have with your health care provider. ?Document Revised: 03/19/2021 Document Reviewed: 03/19/2021 ?Elsevier Patient Education ? [place]. ? ?

## 2021-12-11 ENCOUNTER — Telehealth: Payer: Self-pay | Admitting: Family Medicine

## 2021-12-11 DIAGNOSIS — R7989 Other specified abnormal findings of blood chemistry: Secondary | ICD-10-CM

## 2021-12-11 DIAGNOSIS — D352 Benign neoplasm of pituitary gland: Secondary | ICD-10-CM

## 2021-12-11 LAB — BASIC METABOLIC PANEL
BUN/Creatinine Ratio: 14 (ref 9–23)
BUN: 10 mg/dL (ref 6–20)
CO2: 24 mmol/L (ref 20–29)
Calcium: 9.8 mg/dL (ref 8.7–10.2)
Chloride: 102 mmol/L (ref 96–106)
Creatinine, Ser: 0.74 mg/dL (ref 0.57–1.00)
Glucose: 83 mg/dL (ref 70–99)
Potassium: 4.5 mmol/L (ref 3.5–5.2)
Sodium: 142 mmol/L (ref 134–144)
eGFR: 109 mL/min/{1.73_m2} (ref >59)

## 2021-12-11 LAB — PROLACTIN: Prolactin: 55.8 ng/mL — ABNORMAL HIGH (ref 4.8–23.3)

## 2021-12-11 LAB — LIPID PANEL
Chol/HDL Ratio: 3.4 ratio (ref 0.0–4.4)
Cholesterol, Total: 217 mg/dL — ABNORMAL HIGH (ref 100–199)
HDL: 63 mg/dL (ref >39)
LDL Chol Calc (NIH): 146 mg/dL — ABNORMAL HIGH (ref 0–99)
Triglycerides: 46 mg/dL (ref 0–149)
VLDL Cholesterol Cal: 8 mg/dL (ref 5–40)

## 2021-12-11 LAB — VITAMIN D 25 HYDROXY (VIT D DEFICIENCY, FRACTURES): Vit D, 25-Hydroxy: 22 ng/mL — ABNORMAL LOW (ref 30.0–100.0)

## 2021-12-11 NOTE — Telephone Encounter (Signed)
Test results discussed with her. Prolactine level elevated. She is yet to call her endo's office. ?She was started on OCP yesterday. Continue this pending Endocrinologist's eval. ?She is currently asymptomatic with normal menstrual cycle. ?She agreed with endo referral. - Referral placed. ? ?Vit D slightly low - OTC Vit D supplement recommended. ?FLP slightly improved. ?Continue diet and exercise discussed. ? ?

## 2021-12-16 ENCOUNTER — Telehealth: Payer: Self-pay | Admitting: Family Medicine

## 2021-12-16 LAB — CYTOLOGY - PAP
Chlamydia: NEGATIVE
Comment: NEGATIVE
Comment: NEGATIVE
Comment: NEGATIVE
Comment: NEGATIVE
Comment: NORMAL
Diagnosis: NEGATIVE
HPV 16: NEGATIVE
HPV 18 / 45: NEGATIVE
High risk HPV: POSITIVE — AB
Neisseria Gonorrhea: NEGATIVE
Trichomonas: NEGATIVE

## 2021-12-16 NOTE — Telephone Encounter (Signed)
I have attempted to reach patient twice regarding her PAP test result. ?HIPAA compliant callback message left. ? ?Whenever she calls, please, advise her that her PAP test is normal, but still HPV positive. ?Hence, she will benefit from getting colposcopic evaluation. ?Please, help her schedule a colposcopy clinic appointment. ?Thanks. ? ?NB: ?PAP result: normal cytology with positive HR HPV non 16/18 ?Similar result in 2021 ? ?In 2017 she had normal cytology, but HPV was not checked. However, a year before, she had positive HPV. Hence, I assume her PAP screening in 2017 would probably be HPV positive as well. ? ?With the above information, colposcopic evaluation is recommended.  ? ?

## 2021-12-16 NOTE — Telephone Encounter (Signed)
Patient returns call to nurse line. Informed of below. Scheduled for colposcopy clinic on 12/25/21. ? ?Talbot Grumbling, RN ? ?

## 2021-12-25 ENCOUNTER — Other Ambulatory Visit: Payer: Self-pay | Admitting: Family Medicine

## 2021-12-25 ENCOUNTER — Ambulatory Visit (INDEPENDENT_AMBULATORY_CARE_PROVIDER_SITE_OTHER): Payer: 59 | Admitting: Family Medicine

## 2021-12-25 ENCOUNTER — Other Ambulatory Visit: Payer: Self-pay

## 2021-12-25 VITALS — BP 108/70 | HR 90 | Wt 162.0 lb

## 2021-12-25 DIAGNOSIS — R87810 Cervical high risk human papillomavirus (HPV) DNA test positive: Secondary | ICD-10-CM

## 2021-12-25 LAB — POCT URINE PREGNANCY: Preg Test, Ur: NEGATIVE

## 2021-12-25 NOTE — Patient Instructions (Signed)
We performed a colposcopy today.  We took a biopsy.  We should have results back from that early next week and I will call you.  You will have some spotting and cramping today.  You should not have severe pain or fever.  If you have either of those or other complications, please call the office or be seen immediately at urgent care or the emergency room. ? ?I recommend abstaining from intercourse for 24 to 48 hours. ?

## 2021-12-25 NOTE — Progress Notes (Signed)
NILM positive hi risk HPV negative for 16/18/45 ? ?Patient given informed consent, signed copy in the chart.  Placed in lithotomy position. Cervix viewed with speculum and colposcope after application of acetic acid.  ? ?Colposcopy adequate (entire squamocolumnar junctions seen  in entirety) ?  Yes.  Long speculum and vaginal sidewall retractors were needed for optimal exposure. ?Acetowhite lesions?  Yes 12:00. ?Punctation?  No ?Mosaicism?  No ?Abnormal vasculature?  None ?Biopsies?  Yes 12:00 ?ECC?  No ?Complications?  No ? ?COMMENTS: ?Patient was given post procedure instructions.  I will notify her of any pathology results. ? ?

## 2022-01-05 ENCOUNTER — Encounter: Payer: Self-pay | Admitting: Family Medicine

## 2022-01-05 DIAGNOSIS — R87619 Unspecified abnormal cytological findings in specimens from cervix uteri: Secondary | ICD-10-CM | POA: Insufficient documentation

## 2022-03-10 ENCOUNTER — Encounter: Payer: Self-pay | Admitting: *Deleted

## 2022-06-30 ENCOUNTER — Ambulatory Visit (INDEPENDENT_AMBULATORY_CARE_PROVIDER_SITE_OTHER): Payer: 59 | Admitting: Family Medicine

## 2022-06-30 ENCOUNTER — Encounter: Payer: Self-pay | Admitting: Family Medicine

## 2022-06-30 DIAGNOSIS — E785 Hyperlipidemia, unspecified: Secondary | ICD-10-CM

## 2022-06-30 DIAGNOSIS — L918 Other hypertrophic disorders of the skin: Secondary | ICD-10-CM

## 2022-06-30 DIAGNOSIS — D353 Benign neoplasm of craniopharyngeal duct: Secondary | ICD-10-CM

## 2022-06-30 DIAGNOSIS — R030 Elevated blood-pressure reading, without diagnosis of hypertension: Secondary | ICD-10-CM | POA: Insufficient documentation

## 2022-06-30 DIAGNOSIS — D352 Benign neoplasm of pituitary gland: Secondary | ICD-10-CM | POA: Diagnosis not present

## 2022-06-30 NOTE — Assessment & Plan Note (Signed)
BP is within range. Patient reassured. We will monitor for now.

## 2022-06-30 NOTE — Progress Notes (Addendum)
    SUBJECTIVE:   CHIEF COMPLAINT / HPI:   Elevated BP/HLD: The patient had a work physical and was informed of elevated BP and cholesterol levels. She is here for a follow-up. She had a BP of 130/85 from work physical exam.  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 Will like to reestablish care with a dermatologist.  Skin tags: C/O itchy flat tags on her neck which is increasing in quantity. She requested derm referral.   PERTINENT  PMH / PSH: PMHx reviewed  OBJECTIVE:   BP 120/85   Pulse 71   Ht '5\' 7"'$  (1.702 m)   Wt 165 lb 6.4 oz (75 kg)   BMI 25.91 kg/m   Physical Exam Vitals and nursing note reviewed.  Cardiovascular:     Rate and Rhythm: Normal rate and regular rhythm.     Heart sounds: Normal heart sounds. No murmur heard. Pulmonary:     Effort: Pulmonary effort is normal. No respiratory distress.     Breath sounds: Normal breath sounds. No wheezing.  Skin:    Comments: Scanty, small ,0.5 mm tags on her neck  Neurological:     General: No focal deficit present.      ASSESSMENT/PLAN:   Elevated BP without diagnosis of hypertension BP is within range. Patient reassured. We will monitor for now.   Hyperlipidemia I reviewed and compared her work lab with the recent one she had from her and both are similar. ASCVD risk is low. Lifestyle modification discussed. Monitor for now.   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   Cutaneous skin tags Very small and scanty. Monitoring discussed. However, she prefers referral to a Derm specialist. Referral placed.     Declined flu shot.   Andrena Mews, MD [place]

## 2022-06-30 NOTE — Assessment & Plan Note (Signed)
Referral to endocrinology placed

## 2022-06-30 NOTE — Patient Instructions (Signed)

## 2022-06-30 NOTE — Assessment & Plan Note (Signed)
I reviewed and compared her work lab with the recent one she had from her and both are similar. ASCVD risk is low. Lifestyle modification discussed. Monitor for now.

## 2022-06-30 NOTE — Assessment & Plan Note (Signed)
Very small and scanty. Monitoring discussed. However, she prefers referral to a Derm specialist. Referral placed.

## 2022-10-15 ENCOUNTER — Encounter: Payer: Self-pay | Admitting: Family Medicine

## 2022-10-15 ENCOUNTER — Ambulatory Visit (INDEPENDENT_AMBULATORY_CARE_PROVIDER_SITE_OTHER): Payer: 59 | Admitting: Family Medicine

## 2022-10-15 VITALS — BP 110/78 | HR 88 | Ht 67.0 in | Wt 162.0 lb

## 2022-10-15 DIAGNOSIS — R1084 Generalized abdominal pain: Secondary | ICD-10-CM

## 2022-10-15 NOTE — Progress Notes (Signed)
    SUBJECTIVE:   CHIEF COMPLAINT / HPI:   Recent illness - Feels that she had flu 2 weeks ago - Continues to have a cough and some dizziness - Feels that rhinorrhea and cough has some improvement - Denies any fevers in the last week  Decreased appetite - Has to force herself to eat one meal a day - Has to use the restroom right after eating and is a soft stool - Not having much lactose products  - Is concerned because her stomach seems upset any time she eats - Previously had nausea but that has resolved  Dizziness - Has fatigue and some dizziness - Feels that it is related to the fact that she has not been eating or drinking much - has not had any falls - Is not associated specifically with standing  PERTINENT  PMH / PSH: Reviewed  OBJECTIVE:   BP 110/78   Pulse 88   Ht '5\' 7"'$  (1.702 m)   Wt 162 lb (73.5 kg)   LMP 10/03/2022   SpO2 99%   BMI 25.37 kg/m   Gen: well-appearing, NAD HEENT: no cervical LAD, no pharyngeal erythema or tonsillar exudates, sinuses non-TTP CV: RRR, no m/r/g appreciated, no peripheral edema Pulm: CTAB, no wheezes/crackles GI: soft, mildly tender with some focality to the RLQ, non-distended, no peritoneal signs, no rebound tenderness Neuro: CN II-XII grossly intact, no focal deficits   ASSESSMENT/PLAN:   Abdominal pain, generalized with decreased appetite  Given the recent illness and decreased appetite, 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 Physical exam is benign and patient has enough intake that I do not think she is dealing with dehydration at this time (though needs to be monitored).  - Recommend bland diet - small amounts of food at a time - Increase fiber and fluids - Avoid lactose related products - Consider trial of probiotics  - Return and ER precautions given if worsening  - Follow-up in 1-2 weeks if no improvement.   Dizziness Intermittently, likely associated with decreased oral intake and recovery from her viral  illness. Orthostatic vitals in the clinic were normal and patient was not symptomatic upon standing or exam. - Encouraged increased oral intake (especially with hydrating fluids) - Return and ER precautions discussed  Sarah Prince, [place]

## 2022-10-15 NOTE — Patient Instructions (Addendum)
The cough you are having can last for up to 8 weeks, as long as it is improving there is nothing we would do unless it was lasting you for 8 weeks.   For the decreased appetite and abnormal poops, you can have what we call post-infectious IBS (irritable bowel syndrome). Which I wonder if you may have since getting the flu. The main recommendation is to take over the counter probiotics, increase your fiber intake and avoid foods with lactose products in it.   I would recommend following up with your PCP in 1-2 weeks if the symptoms are not improving. If you start having significant abdominal pain then I would want you to come in sooner.

## 2023-01-18 ENCOUNTER — Telehealth: Payer: Self-pay

## 2023-01-18 NOTE — Telephone Encounter (Signed)
Patient calls nurse line requesting to schedule appointment for multiple concerns.   She states that "for awhile now," she has been having issues with congestion and feeling like airway is "smaller." She denies difficulty breathing, swallowing or chest pain at this time.   She also reports needing endocrinology referral and evaluation for hair loss.   Patient scheduled with provider tomorrow afternoon for evaluation of congestion. Also scheduled for PCP follow up at the end of April.   Red flags and ED precautions discussed.   Veronda Prude, RN

## 2023-01-19 ENCOUNTER — Ambulatory Visit (INDEPENDENT_AMBULATORY_CARE_PROVIDER_SITE_OTHER): Payer: 59 | Admitting: Family Medicine

## 2023-01-19 ENCOUNTER — Encounter: Payer: Self-pay | Admitting: Family Medicine

## 2023-01-19 VITALS — BP 107/76 | HR 79 | Temp 98.1°F | Ht 67.0 in | Wt 159.8 lb

## 2023-01-19 DIAGNOSIS — R4589 Other symptoms and signs involving emotional state: Secondary | ICD-10-CM

## 2023-01-19 DIAGNOSIS — J383 Other diseases of vocal cords: Secondary | ICD-10-CM

## 2023-01-19 DIAGNOSIS — J988 Other specified respiratory disorders: Secondary | ICD-10-CM | POA: Diagnosis not present

## 2023-01-19 DIAGNOSIS — R0600 Dyspnea, unspecified: Secondary | ICD-10-CM

## 2023-01-19 NOTE — Assessment & Plan Note (Signed)
Chronic issue occurring frequently (almost daily) since at least 2021, occurs with exertion and laying flat. Described as airway narrowing but denies reflux and asthma symptoms. Possible diagnosis includes vocal cord dysfunction vs tracheomalacia. Seen by Cardiology in 2021 for similar issue, BNP normal but lost to follow up. -Refer to [facility] for further evaluation and possible PFTs

## 2023-01-19 NOTE — Progress Notes (Signed)
    SUBJECTIVE:   CHIEF COMPLAINT / HPI:   Dyspnea Reports prolonged history of intermittent difficulty breathing that she describes as feeling like her airway is narrowing. No identifiable trigger but states it has been going on for years. Occurs after exertion. Also occurred before bed recently when laying on her back and was relieved by turning onto her side. Denies reflux, wheezing, fever cough and congestion. Occurs regularly but not necessarily every day. Thinks her neck may be larger circumference  PERTINENT  PMH / PSH: Peripheral edema  OBJECTIVE:   BP 107/76   Pulse 79   Temp 98.1 F (36.7 C)   Ht  (1.702 m)   Wt 159 lb 12.8 oz (72.5 kg)   LMP 01/06/2023   SpO2 99%   BMI 25.03 kg/m   General: NAD, pleasant, able to participate in exam HEENT: Possible anterior lateral neck fullness bilaterally upon exam. Non-erythematous pharynx. Non-palpable lymphadenopathy. No rhinorrhea. Cardiac: RRR, no murmurs. Respiratory: CTAB, normal effort, No wheezes, rales or rhonchi Extremities: no edema or cyanosis. Skin: warm and dry, no rashes noted Neuro: alert, no obvious focal deficits Psych: Normal affect and mood.  ASSESSMENT/PLAN:   Depressed mood PHQ9: 18, with positive response of question 9. Denies SI currently. Expressed interest in counseling resources but not interested in medication at this time. -Counseling resources provided -F/u in 2 weeks with PCP to discuss mood  Dyspnea Chronic issue occurring frequently (almost daily) since at least 2021, occurs with exertion and laying flat. Described as airway narrowing but denies reflux and asthma symptoms. Possible diagnosis includes vocal cord dysfunction vs tracheomalacia. Seen by Cardiology in 2021 for similar issue, BNP normal but lost to follow up. -Refer to Pulm for further evaluation and possible PFTs    Dr. Elberta Fortis, DO [facility]

## 2023-01-19 NOTE — Assessment & Plan Note (Signed)
PHQ9: 18, with positive response of question 9. Denies SI currently. Expressed interest in counseling resources but not interested in medication at this time. -Counseling resources provided -F/u in 2 weeks with PCP to discuss mood

## 2023-01-19 NOTE — Patient Instructions (Addendum)
It was wonderful to see you today! Thank you for choosing [facility].   Please bring ALL of your medications with you to every visit.   Today we talked about:  I am referring you to a lung doctor to evaluate your lung function. Our office will call you about that referral. I provided a list of therapy resources below. Please reach out at your earliest convenience to establish counseling services. Medication options are available if you are interested  Please follow up with Dr. Lum Babe 02/02/2023  Call the clinic at [phone] if your symptoms worsen or you have any concerns.  Please be sure to schedule follow up at the front desk before you leave today.   Sarah Fortis, DO Family Medicine    For information on therapists, please go to [email or web address]. You can also contact your insurance company to find an in-network therapist.   Therapy and Counseling Resources Most providers on this list will take Medicaid. Patients with commercial insurance or Medicare should contact their insurance company to get a list of in network providers.  [facility] (takes children) Location 1: [address] [place], [place] [address] Location 2: [address] [place], [place] [address] [phone]   [facility] (spanish speaking therapist available)(habla espanol)(take medicare and medicaid)  [address], [place], [place] [address], [email or web address] [phone]  [facility]iatry and Counseling [address] [place], [place] [address] [phone]  [facility]   [address], Suite [place], [place] [address]      [phone]  [facility] (spanish available) [address]  [place], [place] [address] [phone]  [facility] (take [facility] and medicare) [address], [address]  [place], [place] [address]       [phone]     [facility] (virtual  only) [phone]  Jovita Kussmaul [facility] [address], [place], [place] [phone]  [facility]:  [address] [place] [phone]  [facility]:  [address] [place] [place] [phone]  [facility] (under & uninsured) [address]   [place] [phone]    [email or web address]    [facility] [address]  [place]    [phone]  [facility] -[address]     Phone:  [phone]     [facility]-  [address]  [phone]   [facility] [address]      [place], [place] [phone]  [facility]: [address] [place], [place], [place]  [phone]   [facility] (Spanish therapist) [email or web address]  [address] [place]  [address]   [place] [address]    [phone]    [facility]   [address],  [place], [place]  [phone]   [facility]  [address] [place]  [phone]  [facility]  [address] [place], [place]        [phone]  Open Access/Walk In Clinic under & uninsured  [facility]  [address] [place], [place] [phone] Crisis [phone]  [facility] [address],  (Spanish)   [address], [place]: [phone]) 8:30 - 12; 1 - 2:30  [facility] [employer],  [address] [place], [place]    [phone]):8:30 - 12;  2 - 3PM  [facility] [employer],  [address]  [place]; [phone]):   Mon - Fri 8 AM - 5 PM  [facility] [address] [place]  MWF 12:30 to 3:00 or call to schedule an appointment  [phone]  Specific Provider options Psychology Today  [email or web address] click on find a therapist  enter your zip code left side and  select or tailor a therapist for your specific need.   [facility] Provider Directory [email or web address]  (Medicaid)   Follow all drop down to find a provider  Social Support program [facility] [phone] or PhotoSolver.pl [address], [place], [place] Recovery support and educational   24- Hour Availability:   [facility]  [address] [place], [place] [phone] Crisis [phone]  [facility] [phone]  [facility]  [phone]   [facility]  [phone] (after hours)  [facility]/Mobile Crisis   [phone]  [place] National Suicide Hotline  [phone] Len Childs)  Call 911 or go to emergency room  [facility]  [phone]);  [place] and [employer]  [phone]); [place], [place], [place], [place], [place], [place], [place]

## 2023-02-02 ENCOUNTER — Encounter: Payer: Self-pay | Admitting: Family Medicine

## 2023-02-02 ENCOUNTER — Ambulatory Visit (INDEPENDENT_AMBULATORY_CARE_PROVIDER_SITE_OTHER): Payer: 59 | Admitting: Family Medicine

## 2023-02-02 VITALS — BP 131/76 | HR 88 | Ht 67.0 in | Wt 160.0 lb

## 2023-02-02 DIAGNOSIS — D352 Benign neoplasm of pituitary gland: Secondary | ICD-10-CM

## 2023-02-02 DIAGNOSIS — L659 Nonscarring hair loss, unspecified: Secondary | ICD-10-CM | POA: Diagnosis not present

## 2023-02-02 DIAGNOSIS — L918 Other hypertrophic disorders of the skin: Secondary | ICD-10-CM

## 2023-02-02 DIAGNOSIS — N979 Female infertility, unspecified: Secondary | ICD-10-CM

## 2023-02-02 DIAGNOSIS — R4589 Other symptoms and signs involving emotional state: Secondary | ICD-10-CM | POA: Diagnosis not present

## 2023-02-02 DIAGNOSIS — D353 Benign neoplasm of craniopharyngeal duct: Secondary | ICD-10-CM

## 2023-02-02 MED ORDER — FLUOCINONIDE 0.05 % EX SOLN: 1.0000 | Freq: Two times a day (BID) | CUTANEOUS | 0 refills | Status: DC

## 2023-02-02 NOTE — Assessment & Plan Note (Signed)
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 Referral to derm placed.

## 2023-02-02 NOTE — Assessment & Plan Note (Signed)
Need Endocrinology f/u. Referral placed.

## 2023-02-02 NOTE — Assessment & Plan Note (Signed)
No acute change. No SI. Defer antidepressant for now. She will schedule psychiatry appointment as discussed.

## 2023-02-02 NOTE — Patient Instructions (Signed)
It was nice seeing you today. I will place a new referral to your dermatologist and endocrinologist.  Please call to schedule appointment with the psychologist for depression. I will see you in 2 weeks for PAP.

## 2023-02-02 NOTE — Assessment & Plan Note (Signed)
She was previously connected with Dr. April Manson and will like to reconnect with them. Referral is not needed for this. She will call their office to schedule f/u. She will reach out to me as needed for referral order.

## 2023-02-02 NOTE — Progress Notes (Signed)
  Date of Visit: 02/02/2023   SUBJECTIVE:   HPI:  Sarah Prince presents today to discuss hair loss, skin tags, fertility treatment, endocrine referral, "airway tightness" and scant blood when wiping. The patient says that she has had about 6 months of hair loss on the crown and top of her head, but first noted thin hair to the crown 11 years ago. She says that her sister lost hair in the same area about 2 years ago and is a few years older than the patient. The patient denies scalp itching, scabs, prolonged pressure, contacts around her with similar hair loss, recent delivery. Her father is bald.  She was called by Dermatology for referral in September, 2023, but they said there was only an appointment 10 months out. The patient declined this appointment in hopes for a sooner appointment.  Fertility: She notes that she was undergoing fertility treatments since 8 years ago and stopped in 2020. OB/Gyn is in town and she is happy to return to him to restart treatment.   Airway complaint: No difficulty swallowing, but shortness of breath upon exercise.  Blood when wiping: The patient says that she last noticed some scant blood 4 months ago. Her menstrual cycle is regular.   [facility]

## 2023-02-02 NOTE — Assessment & Plan Note (Signed)
Benign appearing. Referred to derm as requested.

## 2023-02-02 NOTE — Progress Notes (Signed)
    SUBJECTIVE:   CHIEF COMPLAINT / HPI:   Hair loss/Skin tag: C/O central hair loss is ongoing for x > 6 months and is now worsening. She has a strong family hx of baldism in her father and her sister. She denies pain or itchy scalp. She also has small flat skin tags on her neck she would like to get checked. She connected to dermatologist in the past, but the wait time to schedule appointment was long so she did not schedule the appointment.  Pituitary adenoma: Her previous endocrinologist retired and she wishes to be referred to a different endo.  Depression: She denies SI. She has the psychiatrist referral list provided during her last visit and will call for an appointment. No other concerns today.  Fertility concern: She would like to reestablish care for fertility treatment.  GU concern: She c/o small blood on the wipe after urinating. She felt this might be related to deep penetration. The last episode was 4 months ago. She worries this might be related to her abnormal PAP as well.    PERTINENT  PMH / PSH: PMHx reviewed  OBJECTIVE:   BP 131/76   Pulse 88   Ht 5\' 7"  (1.702 m)   Wt 160 lb (72.6 kg)   LMP 01/06/2023   BMI 25.06 kg/m   Physical Exam Vitals and nursing note reviewed.  HENT:     Head:     Comments: Mild central hair thinning with no scalp scaring. Scanty, very small hyperpigmented flat moles on her neck Cardiovascular:     Rate and Rhythm: Normal rate and regular rhythm.     Heart sounds: Normal heart sounds. No murmur heard. Pulmonary:     Effort: Pulmonary effort is normal. No respiratory distress.     Breath sounds: Normal breath sounds. No wheezing.  Abdominal:     General: Abdomen is flat. Bowel sounds are normal. There is no distension.     Palpations: Abdomen is soft.     Tenderness: There is no abdominal tenderness.  Genitourinary:    Comments: Deferred - PAP appointment scheduled Musculoskeletal:     Right lower leg: No edema.     Left  lower leg: No edema.  Neurological:     General: No focal deficit present.  Psychiatric:        Mood and Affect: Mood normal.      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 Referral to derm placed.    Skin tag Benign appearing. Referred to derm as requested.  PITUITARY ADENOMA Need Endocrinology f/u. Referral placed.  Depressed mood No acute change. No SI. Defer antidepressant for now. She will schedule psychiatry appointment as discussed.   Infertility, female She was previously connected with Dr. April Manson and will like to reconnect with them. Referral is not needed for this. She will call their office to schedule f/u. She will reach out to me as needed for referral order.    GU concerns:  F/U in 2 weeks for assessment and PAP. She agreed with the plan.  Airway concern - no change from baseline. She is aware of her pulmonology appointment.  Janit Pagan, MD [facility]

## 2023-02-16 ENCOUNTER — Other Ambulatory Visit (HOSPITAL_COMMUNITY)
Admission: RE | Admit: 2023-02-16 | Discharge: 2023-02-16 | Disposition: A | Discharge status: Home / Self Care | Payer: Managed Care, Other (non HMO) | Source: Ambulatory Visit | Attending: Family Medicine | Admitting: Family Medicine

## 2023-02-16 ENCOUNTER — Ambulatory Visit (INDEPENDENT_AMBULATORY_CARE_PROVIDER_SITE_OTHER): Payer: Managed Care, Other (non HMO) | Admitting: Family Medicine

## 2023-02-16 ENCOUNTER — Ambulatory Visit (INDEPENDENT_AMBULATORY_CARE_PROVIDER_SITE_OTHER): Payer: Managed Care, Other (non HMO) | Admitting: Internal Medicine

## 2023-02-16 ENCOUNTER — Encounter: Payer: Self-pay | Admitting: Family Medicine

## 2023-02-16 ENCOUNTER — Encounter: Payer: Self-pay | Admitting: Internal Medicine

## 2023-02-16 VITALS — BP 108/66 | HR 73 | Temp 98.3°F | Ht 67.0 in | Wt 161.4 lb

## 2023-02-16 VITALS — BP 107/74 | HR 72 | Ht 67.0 in | Wt 159.4 lb

## 2023-02-16 DIAGNOSIS — R0602 Shortness of breath: Secondary | ICD-10-CM

## 2023-02-16 DIAGNOSIS — Z124 Encounter for screening for malignant neoplasm of cervix: Secondary | ICD-10-CM

## 2023-02-16 DIAGNOSIS — R0600 Dyspnea, unspecified: Secondary | ICD-10-CM | POA: Diagnosis not present

## 2023-02-16 DIAGNOSIS — N939 Abnormal uterine and vaginal bleeding, unspecified: Secondary | ICD-10-CM | POA: Insufficient documentation

## 2023-02-16 HISTORY — DX: Abnormal uterine and vaginal bleeding, unspecified: N93.9

## 2023-02-16 NOTE — Progress Notes (Signed)
Sarah Prince    [id number]    1986/08/21  Primary Care Physician:Eniola, Theador Hawthorne, MD  Referring Physician: Nestor Ramp, MD [address] [place],  [place] [address] Reason for Consultation: shortness of breath Date of Consultation: 02/16/2023  Chief complaint:   Chief Complaint  Patient presents with   Consult    Often sob, at rest and exertion.       HPI: Sarah Prince is a 37 y.o. woman who presents for new patient evaluation of shortness of breath.  She has dyspnea with exertion for the past approximately 2 years.   She is also noticing symptoms when she is laying down and before she goes to sleep. She feels he has a hard time getting a deep breath in.   She denies chest tightness, coughing wheezing. Never had pneumonia or bronchitis.  No pain. Just feels some tightness in her throat. When she opens her mouth and takes an intentional deep breath in and resolves. Symptoms last for seconds.   She also gets short of breath when she is talking on the phone for a long period of time.   She saw a cardiologist two years ago. She had a heart monitor. At that point work up was negative.   She has never been given any inhalers or breathing treatments.   She had an ED visit for chest pain in 2019 but they weren't able to find out a cause for that. No episodes since then. She denies reflux/heartburn.   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  Exposures: lives at home husband and three kids.  Smoking history: never smoker, no passive smoke exposure.   Social History   Occupational History   Not on file  Tobacco Use   Smoking status: Never   Smokeless tobacco: Never  Substance and Sexual Activity   Alcohol use: No   Drug use: No   Sexual activity: Not on file    Relevant family history:  Family History  Problem Relation Age of Onset   Diabetes Mother    Lung disease Neg Hx     Past Medical  History:  Diagnosis Date   Dyspnea 04/13/2020   Leg swelling 04/13/2020   Preterm delivery     Past Surgical History:  Procedure Laterality Date   NO PAST SURGERIES      Physical Exam: Blood pressure 108/66, pulse 73, temperature 98.3 F (36.8 C), temperature source Oral, height 5\' 7"  (1.702 m), weight 161 lb 6.4 oz (73.2 kg), last menstrual period 01/06/2023, SpO2 98 %. Gen:      No acute distress ENT:  mallampati IV, no nasal polyps, mucus membranes moist Lungs:    No increased respiratory effort, symmetric chest wall excursion, clear to auscultation bilaterally, no wheezes or crackles CV:         Regular rate and rhythm; no murmurs, rubs, or gallops.  No pedal edema Abd:      + bowel sounds; soft, non-tender; no distension MSK: no acute synovitis of DIP or PIP joints, no mechanics hands.  Skin:      Warm and dry; no rashes Neuro: normal speech, no focal facial asymmetry Psych: alert and oriented x3, normal mood and affect   Data Reviewed/Medical Decision Making:  Independent interpretation of tests: Imaging:  Review of patient's chest xray Feb 2019  images revealed no acute process. The patient's images have been independently reviewed by  me.    PFTs:  Labs:  Lab Results  Component Value Date   NA 142 12/10/2021   K 4.5 12/10/2021   CO2 24 12/10/2021   GLUCOSE 83 12/10/2021   BUN 10 12/10/2021   CREATININE 0.74 12/10/2021   CALCIUM 9.8 12/10/2021   EGFR 109 12/10/2021   GFRNONAA 113 03/15/2020   Lab Results  Component Value Date   WBC 4.1 05/10/2018   HGB 12.5 05/10/2018   HCT 38.5 05/10/2018   MCV 83 05/10/2018   PLT 225 05/10/2018     Immunization status:  Immunization History  Administered Date(s) Administered   Influenza Split 08/01/2012   Influenza,inj,Quad PF,6+ Mos 11/03/2013, 09/21/2014   Influenza-Unspecified 09/05/2015, 07/14/2020   PFIZER Comirnaty(Gray Top)Covid-19 Tri-Sucrose Vaccine 07/01/2021   PFIZER(Purple Top)SARS-COV-2 Vaccination  12/19/2019, 01/09/2020, 09/24/2020, 07/07/2022   Td 08/13/2007, 10/03/2008   Tdap 10/09/2012     I reviewed prior external note(s) from pcp  I reviewed the result(s) of the labs and imaging as noted above.   I have ordered PFTs   Assessment:  Shortness of breath  Plan/Recommendations:  Unclear etiology for dyspnea. Pattern does not seem to fit with asthma. She is a never smoker. I doubt infection given the chronicity. EILO is a possibility. Will start with PFTs and I will see her back after this. Will need referral to ENT pending results.    Return to Care: Return in about 6 weeks (around 03/30/2023).  Durel Salts, MD Pulmonary and Critical Care Medicine [facility] Office:[phone]  CC: Nestor Ramp, MD

## 2023-02-16 NOTE — Assessment & Plan Note (Signed)
Likely related to friable cervix. I discussed observation with her. However, she is very much concerned about cancer and would like to get pelvic US done. Pelvic US ordered and was scheduled.

## 2023-02-16 NOTE — Patient Instructions (Signed)
Preventing Cervical Cancer Cervical cancer is an abnormal growth of cells in the cervix. The cervix is the lowest part of the uterus. It connects the vagina and the uterus. Cancer happens when cells become abnormal and start to grow out of control. If cervical cancer is not found early it can spread to other parts of the body (metastasize). Cervical cancer cannot always be prevented, but you can take steps to lower your risk. How can this condition affect me? Cervical cancer may not cause any symptoms at first. However, if it is found early, cervical cancer can be treated effectively. If it is not caught early, over time, the cancer can grow deep into the cervix tissue and spread to other areas, making it harder to treat. Most cases of cervical cancer are caused by a sexually transmitted infection (STI) called human papillomavirus (HPV). One way to reduce your risk of cervical cancer is to take steps to avoid infection with the HPV virus. What can increase my risk? The following factors may make you more likely to develop this condition: Sexual history You have certain things in your sexual history, such as: Having multiple sexual partners or having one sexual partner who has had many sex partners. Not using condoms correctly every time you have sex. Having chlamydia. Having been sexually active before 35 years old. Using birth control pills for a long time. Having had multiple full-term pregnancies. Family or genetic factors Your mother took a medicine called diethylstilbestrol (DES) while pregnant with you, causing you to be exposed to this medicine before birth. Your mother or sister had cervical cancer. Other risk factors You are between 35-45 years old. You have had cancer of the vagina or vulva. You smoke or regularly breathe in secondhand smoke. Your body's defense system (immune system) is weakened. You have a history of abnormal cells (dysplasia) of the cervix. What actions can I  take to prevent cervical cancer? Preventing HPV infection  Ask your health care provider about getting the HPV vaccine. The HPV vaccination series may be given to children starting from 11-12 years old up to age 26 years old. Adults 27-45 years old who were not already vaccinated might choose to get the HPV vaccine depending on their level of risk for HPV. This vaccine protects against the types of HPV that could cause cancer. Limit the number of people you have sex with. Avoid having sex with people who have had many sex partners. Use a a condom every time you have sex. Getting Pap tests Getting regular Pap tests helps identify changes in cells that could lead to cancer. Get Pap tests starting at 35 years old. Talk with your provider about how often you need these tests. Most females who are 21-29 years old should have a Pap test every 3 years. Most females who are 30-65 years old should have a Pap test in combination with an HPV test every 5 years. Females with a higher risk of cervical cancer, such as those with a weakened immune system or those who were exposed to DES before birth, may need more frequent testing. Lifestyle  Do not use any products that contain nicotine or tobacco. These products include cigarettes, chewing tobacco, and vaping devices, such as e-cigarettes. If you need help quitting, ask your provider. Eat a healthy diet that includes at least 5 servings of fruits and vegetables every day. Lose weight if you are overweight. Where to find support Talk with your provider, school nurse, or local health department for   guidance about screening and vaccination. Some children and teens may be able to get the HPV vaccine free of charge through the U.S. Vaccines for Children (VFC) program. Other places that provide vaccinations include: Public health clinics. Check with your local health department. Federally Qualified Health Centers. At these centers you would pay only what you  can afford. To find one near you, go to: fqhc.org Rural Health Clinics. These are part of a program for Medicare and Medicaid patients who live in rural areas. The National Breast and Cervical Cancer Early Detection Program also provides breast and cervical cancer screenings and diagnostic services to low-income, uninsured, and underinsured females. Cervical cancer can be passed down through families. Talk with your provider or a genetic counselor to learn more about genetic testing for cancer. Where to find more information American College of Obstetricians and Gynecologists: acog.org American Cancer Society: cancer.org Centers for Disease Control and Prevention: cdc.gov Contact a health care provider if: You have pelvic pain or pressure. You have leg or back pain. You have unusual discharge or bleeding from your vagina. You have unexplained weight loss. This information is not intended to replace advice given to you by your health care provider. Make sure you discuss any questions you have with your health care provider. Document Revised: 06/02/2022 Document Reviewed: 06/02/2022 Elsevier Patient Education  2023 Elsevier Inc.  

## 2023-02-16 NOTE — Patient Instructions (Signed)
Please schedule follow up scheduled with myself in 6 weeks.  If my schedule is not open yet, we will contact you with a reminder closer to that time. Please call [phone] if you haven't heard from [place] a month before.   Before your next visit I would like you to have:  Full set of PFTs, 1 hour.  Follow up with me afterwards, can be same day or different day.

## 2023-02-16 NOTE — Progress Notes (Signed)
    SUBJECTIVE:   CHIEF COMPLAINT / HPI:  Gyn Exam/Vaginal bleed: Here for PAP. Hx of abnormal PAP. She also endorses vaginal bleeding. Whenever she wipes her vulva, she sees blood. This started about 4months ago.  Last episode was yesterday. This sometimes occurs postcoital, but other times, it occurs spontaneously. She denies dysuria or pelvic pain. She is worried about cancer since her sister inlaw died from endometrial cancer two months ago.   PERTINENT  PMH / PSH: PMHx reviewed  OBJECTIVE:   BP 107/74   Pulse 72   Ht 5\' 7"  (1.702 m)   Wt 159 lb 6.4 oz (72.3 kg)   LMP 02/06/2023   SpO2 100%   BMI 24.97 kg/m   Physical Exam Vitals and nursing note reviewed. Exam conducted with a chaperone present Cleatrice Burke).  Cardiovascular:     Rate and Rhythm: Normal rate and regular rhythm.     Heart sounds: Normal heart sounds. No murmur heard. Pulmonary:     Effort: Pulmonary effort is normal. No respiratory distress.     Breath sounds: Normal breath sounds. No wheezing.  Genitourinary:    General: Normal vulva.     Exam position: Lithotomy position.     Pubic Area: No rash.      Labia:        Right: No rash, tenderness, lesion or injury.        Left: No rash, tenderness, lesion or injury.      Vagina: Normal.     Cervix: Friability present. No discharge.     Comments: Cervical bleed with speculum touch     ASSESSMENT/PLAN:   Vaginal bleeding Likely related to friable cervix. I discussed observation with her. However, she is very much concerned about cancer and would like to get pelvic US done. Pelvic US ordered and was scheduled.   Gyn exam: PAP completed. She requested GCC/Chlamydia testing as well which was completed. I will contact her soon with her test results.  Janit Pagan, MD [facility]

## 2023-02-19 ENCOUNTER — Telehealth (INDEPENDENT_AMBULATORY_CARE_PROVIDER_SITE_OTHER): Payer: Managed Care, Other (non HMO) | Admitting: Family Medicine

## 2023-02-19 ENCOUNTER — Ambulatory Visit (HOSPITAL_COMMUNITY)
Admission: RE | Admit: 2023-02-19 | Discharge: 2023-02-19 | Disposition: A | Discharge status: Home / Self Care | Payer: Managed Care, Other (non HMO) | Source: Ambulatory Visit | Attending: Family Medicine | Admitting: Family Medicine

## 2023-02-19 DIAGNOSIS — N923 Ovulation bleeding: Secondary | ICD-10-CM

## 2023-02-19 DIAGNOSIS — N8500 Endometrial hyperplasia, unspecified: Secondary | ICD-10-CM

## 2023-02-19 DIAGNOSIS — N939 Abnormal uterine and vaginal bleeding, unspecified: Secondary | ICD-10-CM | POA: Insufficient documentation

## 2023-02-19 NOTE — Telephone Encounter (Signed)
HIPAA compliant call back message left.  Please advise patient when she calls. Her pelvic US shows that her uterine wall is thickened.. This could be normal in women in the secretory phase of their menstrual cycle. However, given her history of menstrual bleed, I will refer her to Gynecologist for further evaluation.  Dr. Lum Babe

## 2023-02-24 ENCOUNTER — Encounter: Payer: Self-pay | Admitting: Family Medicine

## 2023-02-24 ENCOUNTER — Telehealth: Payer: Self-pay | Admitting: Family Medicine

## 2023-02-24 LAB — CYTOLOGY - PAP
Chlamydia: NEGATIVE
Comment: NEGATIVE
Comment: NEGATIVE
Comment: NEGATIVE
Comment: NEGATIVE
Comment: NEGATIVE
Comment: NORMAL
Diagnosis: UNDETERMINED — AB
HPV 16: NEGATIVE
HPV 18 / 45: NEGATIVE
High risk HPV: POSITIVE — AB
Neisseria Gonorrhea: NEGATIVE
Trichomonas: NEGATIVE

## 2023-02-24 NOTE — Telephone Encounter (Signed)
HIPAA compliant callback message left.  Please advise when she calls. Her PAP test is abnormal. Colposcopy is recommended. Please help her schedule a Gyn clinic appointment for colposcopy. Thanks.

## 2023-02-25 NOTE — Telephone Encounter (Signed)
I called and spoke with the patient. PAP and Pelvic US discussed. She is aware of her Gyn referral. Colpo appointment made for her. All questions were answered.

## 2023-02-26 ENCOUNTER — Other Ambulatory Visit: Payer: Self-pay

## 2023-02-26 DIAGNOSIS — D352 Benign neoplasm of pituitary gland: Secondary | ICD-10-CM

## 2023-02-26 NOTE — Progress Notes (Signed)
Orders Placed This Encounter  Procedures   TSH    Standing Status:   Future    Standing Expiration Date:   08/29/2023   Basic metabolic panel    Standing Status:   Future    Standing Expiration Date:   08/29/2023   Hormone Panel    Standing Status:   Future    Standing Expiration Date:   08/29/2023   T4, free    Standing Status:   Future    Standing Expiration Date:   08/29/2023   Prolactin    Standing Status:   Future    Standing Expiration Date:   08/29/2023   Follicle stimulating hormone    Standing Status:   Future    Standing Expiration Date:   08/29/2023   Luteinizing hormone    Standing Status:   Future    Standing Expiration Date:   08/29/2023   Growth hormone    Standing Status:   Future    Standing Expiration Date:   08/29/2023   Insulin-like growth factor    Standing Status:   Future    Standing Expiration Date:   08/29/2023   ACTH    Standing Status:   Future    Standing Expiration Date:   08/29/2023   Cortisol    Standing Status:   Future    Standing Expiration Date:   08/29/2023

## 2023-03-05 NOTE — Addendum Note (Signed)
Addended byAltamese [place] on: 03/05/2023 11:16 AM   Modules accepted: Orders

## 2023-03-11 ENCOUNTER — Ambulatory Visit (INDEPENDENT_AMBULATORY_CARE_PROVIDER_SITE_OTHER): Payer: Managed Care, Other (non HMO) | Admitting: Student

## 2023-03-11 ENCOUNTER — Other Ambulatory Visit: Payer: Self-pay | Admitting: Family Medicine

## 2023-03-11 VITALS — BP 120/76 | HR 84 | Ht 67.0 in | Wt 159.2 lb

## 2023-03-11 DIAGNOSIS — R87810 Cervical high risk human papillomavirus (HPV) DNA test positive: Secondary | ICD-10-CM

## 2023-03-11 DIAGNOSIS — R87619 Unspecified abnormal cytological findings in specimens from cervix uteri: Secondary | ICD-10-CM

## 2023-03-11 DIAGNOSIS — R87610 Atypical squamous cells of undetermined significance on cytologic smear of cervix (ASC-US): Secondary | ICD-10-CM

## 2023-03-11 LAB — POCT URINE PREGNANCY: Preg Test, Ur: NEGATIVE

## 2023-03-11 NOTE — Progress Notes (Signed)
    SUBJECTIVE:   CHIEF COMPLAINT / HPI:   Abnormal PAP Pt presents for colposcopy today. Pap smear on 02/16/2023 showing ASC-US and high risk HPV.  Previous pap + for high risk HPV, NILM   OBJECTIVE:   BP 120/76   Pulse 84   Ht 5\' 7"  (1.702 m)   Wt 159 lb 3.2 oz (72.2 kg)   LMP 03/07/2023   SpO2 98%   BMI 24.93 kg/m    General: NAD, pleasant, able to participate in exam Cardiac: Well-perfused Respiratory: Breathing comfortably on room air Extremities: no edema or cyanosis. GU: Chaperoned by RN.  Normal external female genitalia, pink moist vaginal mucosa, mild amount of blood-tinged discharge in vaginal canal.  Cervical ectropion and nabothian cysts present, no cervical masses -see below for colpo findings Skin: warm and dry Neuro: alert, no obvious focal deficits Psych: Normal affect and mood  Procedure:  Pre-Procedure   Consent signed and reviewed with patient: Side effects, risks and complications discussed (including bleeding, infection, vaginal discharge, pain or discomfort) Procedure Colposcopy of: Cervix  The vulva, vagina and perianal regions were examined with findings of: no gross abnormalities A speculum was placed, cervix and  Squamocolumnar junction:  Fully visualized Acetic acid solution and lugol's applied: 2 visualized lesions at 5:00 and 9:00 Specimens: ECC with two biopsies at 5:00 and 9:00 Hemostatic measures used: Monsel's solution  There were no complications Patient tolerated procedure well  ASSESSMENT/PLAN:   Abnormal Pap smear of cervix Colposcopy performed today.  Specimens sent for pathology including ECC with 2 cervical biopsies.  Pathology results will return to Dr. Lum Babe.      Dr. Erick Alley, DO [place] [facility] [facility]

## 2023-03-11 NOTE — Patient Instructions (Signed)
Colposcopy A colposcopy is a procedure to find out whether there are abnormal cells on or in a woman's cervix or vagina. The cervix is the part of the womb that sits in the vagina.  Abnormalities tend to occur at the opening of the cervix to the birth canal, where it enters the womb. A colposcopy allows a doctor or trained nurse to find these abnormalities.  In some women, the presence of 'abnormal cells' carries the risk of developing cervical cancer. A colposcopy is used to determine whether treatment will be needed to deal with these cells.  After your colposcopy You should be able to continue with your daily activities after your appointment, including driving. For a few days after your colposcopy, you may have a brownish vaginal discharge, or light bleeding if you had a biopsy. This is normal and will usually stop after 3 to 5 days.  We will notify you with results in the next few days.   

## 2023-03-11 NOTE — Assessment & Plan Note (Signed)
Colposcopy performed today.  Specimens sent for pathology including ECC with 2 cervical biopsies.  Pathology results will return to Dr. Lum Babe.

## 2023-03-12 ENCOUNTER — Other Ambulatory Visit (INDEPENDENT_AMBULATORY_CARE_PROVIDER_SITE_OTHER): Payer: Managed Care, Other (non HMO)

## 2023-03-12 DIAGNOSIS — D353 Benign neoplasm of craniopharyngeal duct: Secondary | ICD-10-CM | POA: Diagnosis not present

## 2023-03-12 DIAGNOSIS — D352 Benign neoplasm of pituitary gland: Secondary | ICD-10-CM

## 2023-03-12 LAB — FOLLICLE STIMULATING HORMONE: FSH: 12.9 m[IU]/mL

## 2023-03-12 LAB — BASIC METABOLIC PANEL
BUN: 15 mg/dL (ref 6–23)
CO2: 26 mEq/L (ref 19–32)
Calcium: 9.3 mg/dL (ref 8.4–10.5)
Chloride: 105 mEq/L (ref 96–112)
Creatinine, Ser: 0.81 mg/dL (ref 0.40–1.20)
GFR: 94.31 mL/min (ref >60.00)
Glucose, Bld: 84 mg/dL (ref 70–99)
Potassium: 4.3 mEq/L (ref 3.5–5.1)
Sodium: 139 mEq/L (ref 135–145)

## 2023-03-12 LAB — TSH: TSH: 0.73 u[IU]/mL (ref 0.35–5.50)

## 2023-03-12 LAB — CORTISOL: Cortisol, Plasma: 5 ug/dL

## 2023-03-12 LAB — LUTEINIZING HORMONE: LH: 6.21 m[IU]/mL

## 2023-03-12 LAB — T4, FREE: Free T4: 0.86 ng/dL (ref 0.60–1.60)

## 2023-03-16 ENCOUNTER — Encounter: Payer: Self-pay | Admitting: Family Medicine

## 2023-03-16 ENCOUNTER — Telehealth: Payer: Self-pay | Admitting: Family Medicine

## 2023-03-16 NOTE — Telephone Encounter (Signed)
HIPAA compliant callback message left.   Please advise her that her cervical biopsy report from her colposcopic exam showed abnormality.  There is mild dysplasia of her cervical cells with no cervical cancer. Given her PAP report and cervical biopsy report, I recommend repeat PAP with co-testing in 1 year.

## 2023-03-18 LAB — INSULIN-LIKE GROWTH FACTOR
IGF-I, LC/MS: 133 ng/mL (ref 53–331)
Z-Score (Female): -0.2 SD (ref ?–2.0)

## 2023-03-18 LAB — ESTRADIOL: Estradiol: 43 pg/mL

## 2023-03-18 LAB — PROLACTIN: Prolactin: 25 ng/mL

## 2023-03-18 LAB — ACTH: C206 ACTH: 17 pg/mL (ref 6–50)

## 2023-03-18 LAB — GROWTH HORMONE: Growth Hormone: <0.1 ng/mL (ref <=7.1)

## 2023-03-19 ENCOUNTER — Encounter: Payer: Self-pay | Admitting: Neurology

## 2023-03-19 ENCOUNTER — Ambulatory Visit (INDEPENDENT_AMBULATORY_CARE_PROVIDER_SITE_OTHER): Payer: Managed Care, Other (non HMO) | Admitting: "Endocrinology

## 2023-03-19 ENCOUNTER — Encounter: Payer: Self-pay | Admitting: "Endocrinology

## 2023-03-19 VITALS — BP 115/80 | HR 93 | Ht 67.0 in | Wt 160.2 lb

## 2023-03-19 DIAGNOSIS — D352 Benign neoplasm of pituitary gland: Secondary | ICD-10-CM

## 2023-03-19 NOTE — Progress Notes (Signed)
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, prolactinoma, however off of treatment for 2 years prolactin normal patient reports 30-minute blurry vision, headaches and heavy head Ordered MRI brain with and without contrast Ordered neurology referral to rule out TIA, if no neurology input would recommend ophthalmology follow-up to rule out eye disease  Patient has been off of cabergoline 0.5 mg 1 tablet twice a week since 2022 Current prolactin level WNL Other pituitary hormones in acceptable range Will repeat prolactin and 8 AM cortisol  Patient is planning to follow-up with [facility] next month   Return in about 3 months (around 06/19/2023) for visit + 8AM labs before next visit.   I have reviewed current medications, nurse's notes, allergies, vital signs, past medical and surgical history, family medical history, and social history for this encounter. Counseled patient on symptoms, examination findings, lab findings, imaging results, treatment decisions and monitoring and prognosis. The patient understood the recommendations and agrees with the treatment plan. All questions regarding treatment plan were fully answered.  Altamese [place], MD  03/19/23   History of Present Illness HPI    Sarah Prince is a 37 y.o. 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 Had been on cabergoline since 07/2002.   Current history: reports head ache once a week, no vision change except one time  had blurry vision lasting 30 min that self resolved in 02/2023 No nausea/vomiting/dizziness/galactorrhea  Feels heavy on the head  Last cabergoline in 2022, doesn't remember last dose-couldn't take due to cost Starts appointment with [facility] in 04/2023 Has two children   Prior history:  Started Dostinex (cabergoline) in [place], [place] in 10/03 (at age 37).  MRI in 2003 revealed a 13 x 15 mm hemorrhagic macroadenoma. MRI in 5/10 showed much smaller (4 mm) pituitary microadenoma.   Dostinex was stopped periodically, including in 11/08, but prolactin level rose and menses again became irregular each time.  She was previously followed by endocrinologist Sabino Donovan, MD in [facility]) prior to moving to [place] in 2010.   Cabergoline (resumed in 12/20) 0.5 mg 1 tablet twice a week. (Prior cabergoline was stopped after 2009 pregnancy. She resumed it in 2/16 at 0.5 mg twice a week due to prolactin level higher at 68.6 but she stopped it by 5/16 due to deductible cost. Resumed in 9/18 but she stopped it in 4/20 due to "stressed." She resumed it right after 09/07/19 lab).  She started seeing Noland Fordyce, MD in about 2/18 and has had infertility treatments including clomiphene (last in about 5/18) and letrozole (last in about 7/18). She had a prolactin level of 70.1 per K. Ernestina Penna, MD on 04/27/17 (likely was in early pregnancy). She had a miscarriage at about [redacted] weeks gestation in 8/18. She then saw seeing Fermin Schwab, MD [facility]).  Records: Menarche age 75. Menses were initially regular.  Prolactinoma diagnosed in 2003 (  age 58) when she presented with amenorrhea, dizziness and headaches. MRI at that time revealed a 13 x 15 mm hemorrhagic macroadenoma. Started Dostinex (cabergoline) in [place], [place] in 10/03 (at age 9). Regular menses resumed a few months later. Headaches and dizziness then resolved. MRI in  2/04 and 8/04 showed a smaller 10 x 10 x 10 mm pituitary adenoma. [place]. Dostinex was stopped periodically, including in 11/08, but prolactin level rose and menses again became irregular each time. Prolactin level was 69 in 2/09 off Dostinex for 3 months. Resumed Dostinex in 3/09. She then became pregnant (pregnancy 1) and stopped Dostinex in 5/09. Prolactin was 106 in 6/09 (early pregnancy; so partly physiologic elevation). At that time TSH was normal at 0.538 and cortisol was normal at 8.7. Baby was delivered at [redacted] weeks gestation in 11/09 requiring one month in the NICU. She breast fed after that pregnancy without difficulty. She remained off cabergoline after 2009 pregnancy. Prolactin level was 86 off Rx in 3/10. MRI in 5/10 showed much smaller (4 mm) pituitary microadenoma.  She was previously followed by endocrinologist Sabino Donovan, MD in [facility]) prior to moving to [place] in 2010. Prolactin level was 29.4 off Rx in 9/10, 33.3 off Rx in 3/11 (at initial endocrinology consultation with me; which we decided to continue to monitor without treatment). Prolactin level was 30.5 off Rx in 3/12, 34.4 off Rx in 3/13. TSH was 0.691 in 3/13. Pregnancy 2 was diagnosed on 02/02/12. Prolactin level was 21.0 (2.8-29.2) off Rx in 1/15. Prolactin level 68.6 in 2/16 (off Rx). She was instructed then to resume cabergoline 0.5 mg twice a week; 3 month follow-up was planned but she did not keep that appt. Prolactin level 83.3 in 4/17 (off Rx). She was seen on 02/04/16; reported then she had stopped the cabergoline by 5/16 due to cost (deductible). I then instructed her to resume cabergoline, but she did not do so until 10/17 when she took it for a month in anticipation of a prolactin level being checked elsewhere; she says that level was normal so on her own she decided to stop the cabergoline (since her periods had been regular in 2017 she thought she  was more likely to be able to get pregnant off it). She started seeing Noland Fordyce, MD in about 2/18 and has had infertility treatments including clomiphene (last in about 5/18) and letrozole (last in about 7/18). She had a prolactin level of 70.1 per K. Ernestina Penna, MD on 04/27/17 (likely was in early pregnancy). She had a miscarriage at about [redacted] weeks gestation in 8/18. She is now seeing Fermin Schwab, MD [facility]). Meds:  Cabergoline (resumed in 12/20) 0.5 mg 1 tablet twice a week. (Prior cabergoline was stopped after 2009 pregnancy. She resumed it in 2/16 at 0.5 mg twice a week due to prolactin level higher at 68.6 but she stopped it by 5/16 due to deductible cost. Resumed in 9/18 but she stopped it in 4/20 due to "stressed." She resumed it right after 09/07/19 lab). (Prior Femara [letrozole] - holding again since 4/20). (Prior Clomid [clomiphene] - holding since 4/20). (Prior Tri-Sprintec). (Prior Implanon removed in 8/12). (Prior Depo-Provera stopped due to daily bleeding). Symptoms:  History of chronic oligomenorrhea. Menses were regular when she took BCP regularly, irregular when she forgot BCP at times in the past. Since early 2017 menses have been quite regular, every 28-31 days. She never had any galactorrhea.  No significant  headaches. No visual field cuts. Lab:  Prolactin levels as above. She states prolactin level was about 36 in about 5/19 at which time she was frequently missing doses. Prolactin level 9.9 on 05/10/18 on cabergoline 0.5 mg weekly. Prolactin level 40.0 on 09/07/19 off Rx. Prolactin level 5.6 on 11/13/19 on cabergoline 0.5 mg twice a week. Assessment:  Prolactin level was higher in 2016-2018 (unclear whether she was pregnant at the time of the lab in 7/18; subsequent miscarriage in 8/18; not as high as at times in the past), then in good control on cabergoline; then 40.0 in 12/20 prior to resuming cabergoline; now in good control on  cabergoline.  Physical Exam  BP 115/80   Pulse 93   Ht 5\' 7"  (1.702 m)   Wt 160 lb 3.2 oz (72.7 kg)   LMP 03/07/2023   SpO2 99%   BMI 25.09 kg/m    Constitutional: well developed, well nourished Head: normocephalic, atraumatic Eyes: sclera anicteric, no redness Neck: supple Lungs: normal respiratory effort Neurology: alert and oriented Skin: dry, no appreciable rashes Musculoskeletal: no appreciable defects Psychiatric: normal mood and affect   Current Medications Patient's Medications  New Prescriptions   No medications on file  Previous Medications   CABERGOLINE (DOSTINEX) 0.5 MG TABLET    Take 0.5 mg by mouth once a week.   CHOLECALCIFEROL (VITAMIN D) 1000 UNITS TABLET    Take 1,000 Units by mouth daily.   FLUOCINONIDE (LIDEX) 0.05 % EXTERNAL SOLUTION    Apply 1 Application topically 2 (two) times daily. Scalp solution for hair loss   NORGESTIMATE-ETHINYL ESTRADIOL TRIPHASIC (ORTHO TRI-CYCLEN LO) 0.18/0.215/0.25 MG-25 MCG TAB    Take 1 tablet by mouth daily.  Modified Medications   No medications on file  Discontinued Medications   No medications on file    Allergies No Known Allergies  Past Medical History Past Medical History:  Diagnosis Date   Dyspnea 04/13/2020   Leg swelling 04/13/2020   Preterm delivery     Past Surgical History Past Surgical History:  Procedure Laterality Date   NO PAST SURGERIES      Family History family history includes Diabetes in her mother.  Social History Social History   Socioeconomic History   Marital status: Married    Spouse name: Not on file   Number of children: Not on file   Years of education: Not on file   Highest education level: Not on file  Occupational History   Not on file  Tobacco Use   Smoking status: Never   Smokeless tobacco: Never  Substance and Sexual Activity   Alcohol use: No   Drug use: No   Sexual activity: Not on file  Other Topics Concern   Not on file  Social History Narrative   Pt  lives with her son Melina [place]ena) born in November 2009 and daughter born in 2014.  and father of baby [facility] [place]).  Works in business.  Degree in business.    Parents live in [place].  Moved to [place] in 2005, travels back to [place], has also previously lived in [place].   Social Determinants of Health   Financial Resource Strain: Not on file  Food Insecurity: Not on file  Transportation Needs: Not on file  Physical Activity: Not on file  Stress: Not on file  Social Connections: Not on file  Intimate Partner Violence: Not on file    Lab Results  Component Value Date   CHOL 217 (H) 12/10/2021   Lab Results  Component Value Date   HDL 63 12/10/2021   Lab Results  Component Value Date   LDLCALC 146 (H) 12/10/2021   Lab Results  Component Value Date   TRIG 46 12/10/2021   Lab Results  Component Value Date   CHOLHDL 3.4 12/10/2021   Lab Results  Component Value Date   CREATININE 0.81 03/12/2023   Lab Results  Component Value Date   GFR 94.31 03/12/2023      Component Value Date/Time   NA 139 03/12/2023 1140   NA 142 12/10/2021 0920   K 4.3 03/12/2023 1140   CL 105 03/12/2023 1140   CO2 26 03/12/2023 1140   GLUCOSE 84 03/12/2023 1140   GLUCOSE 77 07/15/2012 1128   BUN 15 03/12/2023 1140   BUN 10 12/10/2021 0920   CREATININE 0.81 03/12/2023 1140   CREATININE 0.61 01/04/2015 1016   CALCIUM 9.3 03/12/2023 1140   PROT 7.3 03/15/2020 1545   ALBUMIN 4.6 03/15/2020 1545   AST 15 03/15/2020 1545   ALT 14 03/15/2020 1545   ALKPHOS 82 03/15/2020 1545   BILITOT 0.2 03/15/2020 1545   GFRNONAA 113 03/15/2020 1545   GFRAA 130 03/15/2020 1545      Latest Ref Rng & Units 03/12/2023   11:40 AM 12/10/2021    9:20 AM 03/15/2020    3:45 PM  BMP  Glucose 70 - 99 mg/dL 84  83  91   BUN 6 - 23 mg/dL 15  10  9    Creatinine 0.40 - 1.20 mg/dL 0.45  4.09  8.11   BUN/Creat Ratio 9 - 23  14  13    Sodium 135 - 145 mEq/L 139  142  143   Potassium 3.5 - 5.1 mEq/L 4.3  4.5   4.0   Chloride 96 - 112 mEq/L 105  102  106   CO2 19 - 32 mEq/L 26  24  21    Calcium 8.4 - 10.5 mg/dL 9.3  9.8  9.9        Component Value Date/Time   WBC 4.1 05/10/2018 0943   WBC 8.5 11/17/2017 2201   RBC 4.64 05/10/2018 0943   RBC 4.41 11/17/2017 2201   HGB 12.5 05/10/2018 0943   HCT 38.5 05/10/2018 0943   PLT 225 05/10/2018 0943   MCV 83 05/10/2018 0943   MCH 26.9 05/10/2018 0943   MCH 28.1 11/17/2017 2201   MCHC 32.5 05/10/2018 0943   MCHC 35.1 11/17/2017 2201   RDW 14.1 05/10/2018 0943   LYMPHSABS 1.3 05/10/2018 0943   MONOABS 1.0 11/17/2017 2201   EOSABS 0.1 05/10/2018 0943   BASOSABS 0.0 05/10/2018 0943   Lab Results  Component Value Date   TSH 0.73 03/12/2023   TSH 0.627 03/15/2020   TSH 0.691 12/10/2011   FREET4 0.86 03/12/2023         Parts of this note may have been dictated using voice recognition software. There may be variances in spelling and vocabulary which are unintentional. Not all errors are proofread. Please notify the Thereasa Parkin if any discrepancies are noted or if the meaning of any statement is not clear.

## 2023-03-22 ENCOUNTER — Ambulatory Visit: Payer: Managed Care, Other (non HMO) | Admitting: Internal Medicine

## 2023-03-22 DIAGNOSIS — R0600 Dyspnea, unspecified: Secondary | ICD-10-CM | POA: Diagnosis not present

## 2023-03-22 LAB — PULMONARY FUNCTION TEST
DL/VA % pred: 138 %
DL/VA: 6.09 ml/min/mmHg/L
DLCO cor % pred: 80 %
DLCO cor: 19.84 ml/min/mmHg
DLCO unc % pred: 80 %
DLCO unc: 19.84 ml/min/mmHg
FEF 25-75 Post: 1.41 L/sec
FEF 25-75 Pre: 2.47 L/sec
FEF2575-%Change-Post: -42 %
FEF2575-%Pred-Post: 40 %
FEF2575-%Pred-Pre: 71 %
FEV1-%Change-Post: -12 %
FEV1-%Pred-Post: 49 %
FEV1-%Pred-Pre: 56 %
FEV1-Post: 1.69 L
FEV1-Pre: 1.93 L
FEV1FVC-%Change-Post: -13 %
FEV1FVC-%Pred-Pre: 106 %
FEV6-%Change-Post: 1 %
FEV6-%Pred-Post: 53 %
FEV6-%Pred-Pre: 53 %
FEV6-Post: 2.19 L
FEV6-Pre: 2.15 L
FEV6FVC-%Change-Post: 0 %
FEV6FVC-%Pred-Post: 101 %
FEV6FVC-%Pred-Pre: 100 %
FVC-%Change-Post: 0 %
FVC-%Pred-Post: 53 %
FVC-%Pred-Pre: 52 %
FVC-Post: 2.19 L
FVC-Pre: 2.16 L
Post FEV1/FVC ratio: 77 %
Post FEV6/FVC ratio: 100 %
Pre FEV1/FVC ratio: 89 %
Pre FEV6/FVC Ratio: 100 %
RV % pred: 149 %
RV: 2.45 L
TLC % pred: 85 %
TLC: 4.69 L

## 2023-03-22 NOTE — Progress Notes (Signed)
Full PFT completed today ? ?

## 2023-03-24 ENCOUNTER — Encounter: Payer: Self-pay | Admitting: Family Medicine

## 2023-03-29 ENCOUNTER — Encounter (HOSPITAL_BASED_OUTPATIENT_CLINIC_OR_DEPARTMENT_OTHER): Payer: Managed Care, Other (non HMO)

## 2023-04-02 ENCOUNTER — Ambulatory Visit (INDEPENDENT_AMBULATORY_CARE_PROVIDER_SITE_OTHER): Payer: Managed Care, Other (non HMO) | Admitting: Internal Medicine

## 2023-04-02 ENCOUNTER — Encounter: Payer: Self-pay | Admitting: Internal Medicine

## 2023-04-02 VITALS — BP 118/68 | HR 100 | Temp 98.7°F | Ht 67.0 in | Wt 162.4 lb

## 2023-04-02 DIAGNOSIS — R0602 Shortness of breath: Secondary | ICD-10-CM | POA: Diagnosis not present

## 2023-04-02 NOTE — Progress Notes (Signed)
Sarah Prince    [id number]    [birth year]  Primary Care Physician:Eniola, Theador Hawthorne, MD Date of Appointment: 04/02/2023 Established Patient Visit  Chief complaint:   Chief Complaint  Patient presents with   Follow-up    Pft review      HPI: Sarah Prince is a 37 y.o. woman with shortness of breath.   Interval Updates: Here for follow up after PFTs which show normal pulmonary function.   She feels her symptoms are actually better today, but can't determine a pattern. She has had no episodes in the last month.   She says she has thought about wearing an apple watch to see if it monitors her symptoms  She snores occasionally but denies excessive daytime sleepiness. Sometimes wakes up with her mouth dry.    I have reviewed the patient's family social and past medical history and updated as appropriate.   Past Medical History:  Diagnosis Date   Dyspnea 04/13/2020   Leg swelling 04/13/2020   Preterm delivery     Past Surgical History:  Procedure Laterality Date   NO PAST SURGERIES      Family History  Problem Relation Age of Onset   Diabetes Mother    Lung disease Neg Hx     Social History   Occupational History   Not on file  Tobacco Use   Smoking status: Never   Smokeless tobacco: Never  Substance and Sexual Activity   Alcohol use: No   Drug use: No   Sexual activity: Not on file     Physical Exam: Blood pressure 118/68, pulse 100, temperature 98.7 F (37.1 C), temperature source Oral, height 5\' 7"  (1.702 m), weight 162 lb 6.4 oz (73.7 kg), last menstrual period 03/07/2023, SpO2 98 %.  Gen:      No acute distress ENT:  no nasal polyps, mucus membranes moist Lungs:    No increased respiratory effort, symmetric chest wall excursion, clear to auscultation bilaterally, no wheezes or crackles CV:         Regular rate and rhythm; no murmurs, rubs, or gallops.  No pedal edema   Data Reviewed: Imaging: I have personally reviewed the chest xray 2019  which shows no acute pulmonary process.   PFTs:     Latest Ref Rng & Units 03/22/2023    3:02 PM  PFT Results  FVC-Pre L 2.16  P  FVC-Predicted Pre % 52  P  FVC-Post L 2.19  P  FVC-Predicted Post % 53  P  Pre FEV1/FVC % % 89  P  Post FEV1/FCV % % 77  P  FEV1-Pre L 1.93  P  FEV1-Predicted Pre % 56  P  FEV1-Post L 1.69  P  DLCO uncorrected ml/min/mmHg 19.84  P  DLCO UNC% % 80  P  DLCO corrected ml/min/mmHg 19.84  P  DLCO COR %Predicted % 80  P  DLVA Predicted % 138  P  TLC L 4.69  P  TLC % Predicted % 85  P  RV % Predicted % 149  P    P Preliminary result   I have personally reviewed the patient's PFTs and they show normal pulmonary function.   Labs: Lab Results  Component Value Date   NA 139 03/12/2023   K 4.3 03/12/2023   CO2 26 03/12/2023   GLUCOSE 84 03/12/2023   BUN 15 03/12/2023   CREATININE 0.81 03/12/2023   CALCIUM 9.3 03/12/2023   GFR 94.31 03/12/2023  EGFR 109 12/10/2021   GFRNONAA 113 03/15/2020   Lab Results  Component Value Date   WBC 4.1 05/10/2018   HGB 12.5 05/10/2018   HCT 38.5 05/10/2018   MCV 83 05/10/2018   PLT 225 05/10/2018     Immunization status: Immunization History  Administered Date(s) Administered   Influenza Split 08/01/2012   Influenza,inj,Quad PF,6+ Mos 11/03/2013, 09/21/2014, 07/14/2017   Influenza-Unspecified 09/05/2015, 07/14/2020   PFIZER Comirnaty(Gray Top)Covid-19 Tri-Sucrose Vaccine 07/01/2021   PFIZER(Purple Top)SARS-COV-2 Vaccination 12/19/2019, 01/09/2020, 09/24/2020, 07/07/2022   Td 08/13/2007, 10/03/2008   Tdap 10/09/2012    External Records Personally Reviewed: family medicine  Assessment:  Shortness of breath   Plan/Recommendations: PFTs normal pulmonary function, flow volume loop and no response to bronchodilator.  Unclear etiology for her symptoms but they seem very infrequent - she has had no episodes since I saw her last. Usually worse when she is laying down. At this point no clear pulmonary  process going on with reassuring exam. Discussed possibility of heart arrhtyhmia or OSA. She will try wearing an apple watch to monitor symptoms and see if she notices a pattern. Defer sleep test or trial of albuterol for now given low yield.    Return to Care: Return if symptoms worsen or fail to improve.   Durel Salts, MD Pulmonary and Critical Care Medicine [facility] [phone]

## 2023-04-02 NOTE — Patient Instructions (Signed)
Your pulmonary function testing was completely normal. Please come back and see me again if your symptoms worsen or you're able to identify any patterns that trigger your symptoms. Overall I am very reassured that you have healthy, normal lungs.

## 2023-04-03 ENCOUNTER — Ambulatory Visit (HOSPITAL_COMMUNITY): Payer: Managed Care, Other (non HMO)

## 2023-04-06 ENCOUNTER — Ambulatory Visit (HOSPITAL_COMMUNITY)
Admission: RE | Admit: 2023-04-06 | Discharge: 2023-04-06 | Disposition: A | Discharge status: Home / Self Care | Payer: Managed Care, Other (non HMO) | Source: Ambulatory Visit | Attending: "Endocrinology | Admitting: "Endocrinology

## 2023-04-06 DIAGNOSIS — D352 Benign neoplasm of pituitary gland: Secondary | ICD-10-CM | POA: Diagnosis present

## 2023-04-06 MED ORDER — GADOBUTROL 1 MMOL/ML IV SOLN
7.5000 mL | Freq: Once | INTRAVENOUS | Status: AC | PRN
  Administered 2023-04-06: 7.5 mL via INTRAVENOUS

## 2023-04-12 NOTE — Progress Notes (Unsigned)
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     Headaches infrequent and do not require preventative.  Continue OTC analgesics as needed.  Follow up as needed.  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  History supplemented by referring provider's note.  May headache on computer - suddenly blurred vision couldn't see screen for a couple of minutes- got hungry  Left sided dull throbbing photo lasted a couple of hours -    During ovulation - every 2 weeks.  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  Repeat MRI in 2010 revealed smaller 4 mm pituitary microadenoma.  Had been off treatment for two years.  For several months, she has had headaches described as dull throbbing in both or either temple, usually with photophobia and preceded by feeling hungry.  Lasts a couple of hours with ibuprofen.  Occurs about every 2 weeks, usually on Fridays but may also occur Saturday as well.  In May 2024, she had a typical headache while working at the computer when she developed sudden blurred vision in both eyes.  She couldn't see the screen.  Lasted a few minutes.  She continued to have a headache until she rested and it resolved after a couple of hours.  No recurrence of the blurred vision.  Re-established with endocrinology.  MRI of brain/pituitary with  and without contrast on 04/06/2023 personally reviewed revealed no evidence of pituitary lesion but did demonstrate partially empty sella and 1.6 cm arachnoid cyst at the right cerebellopontine angle.  She had an eye exam late last year which was unremarkable.  No pulsatile tinnitus     PAST MEDICAL HISTORY: Past Medical History:  Diagnosis Date   Dyspnea 04/13/2020   Leg swelling 04/13/2020   Preterm delivery     PAST SURGICAL HISTORY: Past Surgical History:  Procedure Laterality Date   NO PAST SURGERIES      MEDICATIONS: Current Outpatient Medications on File Prior to Visit  Medication Sig Dispense Refill   cabergoline (DOSTINEX) 0.5 MG tablet Take 0.5 mg by mouth once a week. (Patient not taking: Reported on 12/10/2021)     cholecalciferol (VITAMIN D) 1000 units tablet Take 1,000 Units by mouth daily.     fluocinonide (LIDEX) 0.05 % external solution Apply 1 Application topically 2 (two) times daily. Scalp solution for hair loss 60 mL 0   Norgestimate-Ethinyl Estradiol Triphasic (ORTHO TRI-CYCLEN LO) 0.18/0.215/0.25 MG-25 MCG tab Take 1 tablet by mouth daily. (Patient not taking: Reported on 02/16/2023) 28 tablet 11   No current facility-administered medications on file prior to visit.    ALLERGIES: No Known Allergies  FAMILY HISTORY: Family History  Problem Relation Age of Onset   Diabetes Mother    Lung disease Neg Hx     Objective:  Blood pressure 109/72, pulse 94, height 5\' 7"  (1.702 m), weight 161 lb 9.6 oz (73.3 kg), SpO2 97 %. General: No acute distress.  Patient appears well-groomed.   Head:  Normocephalic/atraumatic Eyes:  fundus reveals optic disc with clear margins, no evidence of papilledema Neck: supple, no paraspinal tenderness, full range of motion Back: No paraspinal tenderness Heart: regular rate and rhythm Neurological Exam: Mental status: alert and oriented to person, place, and time, speech fluent and not dysarthric, language intact. Cranial  nerves: CN I: not tested CN II: pupils equal, round and reactive to light, visual fields intact CN III, IV, VI:  full range of motion, no nystagmus, no ptosis CN V: facial sensation intact. CN VII: upper and lower face symmetric CN VIII: hearing intact CN IX, X: gag intact, uvula midline CN XI: sternocleidomastoid and trapezius muscles intact CN XII: tongue midline Bulk & Tone: normal, no fasciculations. Motor:  muscle strength 5/5 throughout Sensation:  Temperature and vibratory sensation intact. Deep Tendon Reflexes:  2+ throughout,  toes downgoing.   Finger to nose testing:  Without dysmetria.   Heel to shin:  Without dysmetria.   Gait:  Normal station and stride.  Romberg negative.    Thank you for allowing me to take part in the care of this patient.  Shon Millet, DO  CC:  Janit Pagan, MD  Altamese Price, MD

## 2023-04-13 ENCOUNTER — Ambulatory Visit (INDEPENDENT_AMBULATORY_CARE_PROVIDER_SITE_OTHER): Payer: Managed Care, Other (non HMO) | Admitting: Neurology

## 2023-04-13 ENCOUNTER — Encounter: Payer: Self-pay | Admitting: Neurology

## 2023-04-13 VITALS — BP 109/72 | HR 94 | Ht 67.0 in | Wt 161.6 lb

## 2023-04-13 DIAGNOSIS — G43009 Migraine without aura, not intractable, without status migrainosus: Secondary | ICD-10-CM

## 2023-04-13 NOTE — Patient Instructions (Signed)
I think the headaches and blurred vision are migraines.  I don't think anything on MRI is cause for headaches or concerning.  Limit use of pain relievers to no more than 2 days out of week to prevent risk of rebound or medication-overuse headache. Keep headache diary.  If headaches worsen, please follow up

## 2023-07-01 ENCOUNTER — Other Ambulatory Visit (INDEPENDENT_AMBULATORY_CARE_PROVIDER_SITE_OTHER): Payer: Managed Care, Other (non HMO)

## 2023-07-01 DIAGNOSIS — D352 Benign neoplasm of pituitary gland: Secondary | ICD-10-CM | POA: Diagnosis not present

## 2023-07-01 LAB — CORTISOL: Cortisol, Plasma: 7.2 ug/dL

## 2023-07-02 ENCOUNTER — Other Ambulatory Visit: Payer: Managed Care, Other (non HMO)

## 2023-07-02 LAB — PROLACTIN: Prolactin: 44.3 ng/mL — ABNORMAL HIGH

## 2023-07-09 ENCOUNTER — Encounter: Payer: Self-pay | Admitting: "Endocrinology

## 2023-07-09 ENCOUNTER — Ambulatory Visit (INDEPENDENT_AMBULATORY_CARE_PROVIDER_SITE_OTHER): Payer: Managed Care, Other (non HMO) | Admitting: "Endocrinology

## 2023-07-09 VITALS — BP 103/70 | HR 79 | Ht 67.0 in | Wt 159.6 lb

## 2023-07-09 DIAGNOSIS — E236 Other disorders of pituitary gland: Secondary | ICD-10-CM

## 2023-07-09 DIAGNOSIS — Z86018 Personal history of other benign neoplasm: Secondary | ICD-10-CM | POA: Diagnosis not present

## 2023-07-09 NOTE — Progress Notes (Signed)
Outpatient Endocrinology Note Sarah Bentonia, MD    Sarah Prince 1986/02/17 [id number]  Referring Provider: Doreene Eland, MD Primary Care Provider: Doreene Eland, MD Reason for consultation: Subjective   Assessment & Plan  Diagnoses and all orders for this visit:  History of prolactinoma -     Prolactin; Future  Empty sella (HCC)   History of pituitary adenoma 1.5 cm, last known 4 mm, prolactinoma, however off of treatment for 2 years prolactin normal. 04/06/23 MRI brain with and without contrast reported partially empty sella, which is often a normal anatomic variant  Ordered neurology referral. Seen by neurology 04/13/23:  1. Migraine with and without aura, without status migrainosus, not intractable 2. 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 Other MRI findings (partial empty sella and posterior fossa arachnoid cyst) benign incidental findings.    Patient has been off of cabergoline 0.5 mg 1 tablet twice a week since 2022 Currently has headaches pre-menstrually, doesn't like to take any pills  Current prolactin level mildly elevated without symptoms Other pituitary hormones in acceptable range Will repeat prolactin in 3 mo, pt to notify us if she develops any new related symptoms, discussed symptoms at length    Return in about 3 months (around 10/09/2023) for visit, fasting labs before next visit.   I have reviewed current medications, nurse's notes, allergies, vital signs, past medical and surgical history, family medical history, and social history for this encounter. Counseled patient on symptoms, examination findings, lab findings, imaging results, treatment decisions and monitoring and prognosis. The patient understood the recommendations and agrees with the treatment plan. All questions regarding treatment plan were fully answered.  Sarah Viera West, MD  07/09/23   History of Present Illness HPI    Sarah Prince is a 37 y.o. year  old female who presents for evaluation of prolactinoma diagnosed in 2003. Had been on cabergoline since 07/2002.   Current history: reports severe head ache during pre-menstrual phase with blurry vision at times  No nausea/vomiting/dizziness/galactorrhea  Last cabergoline in 2022, doesn't remember last dose-couldn't take due to cost Starts appointment with [facility] in 04/2023 Has two children   Prior history:  Started Dostinex (cabergoline) in [place], [place] in 10/03 (at age 5).  MRI in 2003 revealed a 13 x 15 mm hemorrhagic macroadenoma. MRI in 5/10 showed much smaller (4 mm) pituitary microadenoma.   Dostinex was stopped periodically, including in 11/08, but prolactin level rose and menses again became irregular each time.  She was previously followed by endocrinologist Sabino Donovan, MD in [facility] [place] [facility]) prior to moving to [place] in 2010.   Cabergoline (resumed in 12/20) 0.5 mg 1 tablet twice a week. (Prior cabergoline was stopped after 2009 pregnancy. She resumed it in 2/16 at 0.5 mg twice a week due to prolactin level higher at 68.6 but she stopped it by 5/16 due to deductible cost. Resumed in 9/18 but she stopped it in 4/20 due to "stressed." She resumed it right after 09/07/19 lab).  She started seeing Noland Fordyce, MD in about 2/18 and has had infertility treatments including clomiphene (last in about 5/18) and letrozole (last in about 7/18). She had a prolactin level of 70.1 per K. Ernestina Penna, MD on 04/27/17 (likely was in early pregnancy). She had a miscarriage at about [redacted] weeks gestation in 8/18. She then saw seeing Fermin Schwab, MD [facility]).  Records: Menarche age 9. Menses were initially regular.  Prolactinoma diagnosed in 2003 (age 74)  when she presented with amenorrhea, dizziness and headaches. MRI at that time revealed a 13 x 15 mm hemorrhagic macroadenoma. Started Dostinex (cabergoline) in [place] in 10/03 (at age 37). Regular menses resumed a few months later. Headaches and dizziness then resolved. MRI in 2/04 and 8/04 showed a smaller 10 x 10 x 10 mm pituitary adenoma. [place]. Dostinex was stopped periodically, including in 11/08, but prolactin level rose and menses again became irregular each time. Prolactin level was 69 in 2/09 off Dostinex for 3 months. Resumed Dostinex in 3/09. She then became pregnant (pregnancy 1) and stopped Dostinex in 5/09. Prolactin was 106 in 6/09 (early pregnancy; so partly physiologic elevation). At that time TSH was normal at 0.538 and cortisol was normal at 8.7. Baby was delivered at [redacted] weeks gestation in 11/09 requiring one month in the NICU. She breast fed after that pregnancy without difficulty. She remained off cabergoline after 2009 pregnancy. Prolactin level was 86 off Rx in 3/10. MRI in 5/10 showed much smaller (4 mm) pituitary microadenoma.  She was previously followed by endocrinologist Sabino Donovan, MD in [facility] prior to moving to [place] in 2010. Prolactin level was 29.4 off Rx in 9/10, 33.3 off Rx in 3/11 (at initial endocrinology consultation with me; which we decided to continue to monitor without treatment). Prolactin level was 30.5 off Rx in 3/12, 34.4 off Rx in 3/13. TSH was 0.691 in 3/13. Pregnancy 2 was diagnosed on 02/02/12. Prolactin level was 21.0 (2.8-29.2) off Rx in 1/15. Prolactin level 68.6 in 2/16 (off Rx). She was instructed then to resume cabergoline 0.5 mg twice a week; 3 month follow-up was planned but she did not keep that appt. Prolactin level 83.3 in 4/17 (off Rx). She was seen on 02/04/16; reported then she had stopped the cabergoline by 5/16 due to cost (deductible). I then instructed her to resume cabergoline, but she did not do so until 10/17 when she took it for a month in anticipation of a prolactin level being checked elsewhere; she says that  level was normal so on her own she decided to stop the cabergoline (since her periods had been regular in 2017 she thought she was more likely to be able to get pregnant off it). She started seeing Noland Fordyce, MD in about 2/18 and has had infertility treatments including clomiphene (last in about 5/18) and letrozole (last in about 7/18). She had a prolactin level of 70.1 per K. Ernestina Penna, MD on 04/27/17 (likely was in early pregnancy). She had a miscarriage at about [redacted] weeks gestation in 8/18. She is now seeing Fermin Schwab, MD [facility]). Meds:  Cabergoline (resumed in 12/20) 0.5 mg 1 tablet twice a week. (Prior cabergoline was stopped after 2009 pregnancy. She resumed it in 2/16 at 0.5 mg twice a week due to prolactin level higher at 68.6 but she stopped it by 5/16 due to deductible cost. Resumed in 9/18 but she stopped it in 4/20 due to "stressed." She resumed it right after 09/07/19 lab). (Prior Femara [letrozole] - holding again since 4/20). (Prior Clomid [clomiphene] - holding since 4/20). (Prior Tri-Sprintec). (Prior Implanon removed in 8/12). (Prior Depo-Provera stopped due to daily bleeding). Symptoms:  History of chronic oligomenorrhea. Menses were regular when she took BCP regularly, irregular when she forgot BCP at times in the past. Since early 2017 menses have been quite regular, every 28-31 days. She never had any galactorrhea.  No significant headaches. No  visual field cuts. Lab:  Prolactin levels as above. She states prolactin level was about 36 in about 5/19 at which time she was frequently missing doses. Prolactin level 9.9 on 05/10/18 on cabergoline 0.5 mg weekly. Prolactin level 40.0 on 09/07/19 off Rx. Prolactin level 5.6 on 11/13/19 on cabergoline 0.5 mg twice a week. Assessment:  Prolactin level was higher in 2016-2018 (unclear whether she was pregnant at the time of the lab in 7/18; subsequent miscarriage in 8/18; not as high as at times in the  past), then in good control on cabergoline; then 40.0 in 12/20 prior to resuming cabergoline; now in good control on cabergoline.  Physical Exam  BP 103/70   Pulse 79   Ht 5\' 7"  (1.702 m)   Wt 159 lb 9.6 oz (72.4 kg)   SpO2 98%   BMI 25.00 kg/m    Constitutional: well developed, well nourished Head: normocephalic, atraumatic Eyes: sclera anicteric, no redness Neck: supple Lungs: normal respiratory effort Neurology: alert and oriented Skin: dry, no appreciable rashes Musculoskeletal: no appreciable defects Psychiatric: normal mood and affect   Current Medications Patient's Medications  New Prescriptions   No medications on file  Previous Medications   CHOLECALCIFEROL (VITAMIN D) 1000 UNITS TABLET    Take 1,000 Units by mouth daily.   FLUOCINONIDE (LIDEX) 0.05 % EXTERNAL SOLUTION    Apply 1 Application topically 2 (two) times daily. Scalp solution for hair loss  Modified Medications   No medications on file  Discontinued Medications   No medications on file    Allergies No Known Allergies  Past Medical History Past Medical History:  Diagnosis Date   Dyspnea 04/13/2020   Leg swelling 04/13/2020   Preterm delivery     Past Surgical History Past Surgical History:  Procedure Laterality Date   NO PAST SURGERIES      Family History family history includes Diabetes in her mother.  Social History Social History   Socioeconomic History   Marital status: Married    Spouse name: Not on file   Number of children: Not on file   Years of education: Not on file   Highest education level: Not on file  Occupational History   Not on file  Tobacco Use   Smoking status: Never   Smokeless tobacco: Never  Vaping Use   Vaping status: Never Used  Substance and Sexual Activity   Alcohol use: No   Drug use: No   Sexual activity: Not on file  Other Topics Concern   Not on file  Social History Narrative   Pt lives with her son Melina Modena) born in November 2009 and  daughter born in 2014.  and father of baby [facility]).  Works in business.  Degree in business.    Parents live in [place].  Moved to [place] in 2005, travels back to [place], has also previously lived in [place].   Social Determinants of Health   Financial Resource Strain: Not on file  Food Insecurity: Not on file  Transportation Needs: Not on file  Physical Activity: Not on file  Stress: Not on file  Social Connections: Not on file  Intimate Partner Violence: Not on file    Lab Results  Component Value Date   CHOL 217 (H) 12/10/2021   Lab Results  Component Value Date   HDL 63 12/10/2021   Lab Results  Component Value Date   LDLCALC 146 (H) 12/10/2021   Lab Results  Component Value Date   TRIG 46 12/10/2021  Lab Results  Component Value Date   CHOLHDL 3.4 12/10/2021   Lab Results  Component Value Date   CREATININE 0.81 03/12/2023   Lab Results  Component Value Date   GFR 94.31 03/12/2023      Component Value Date/Time   NA 139 03/12/2023 1140   NA 142 12/10/2021 0920   K 4.3 03/12/2023 1140   CL 105 03/12/2023 1140   CO2 26 03/12/2023 1140   GLUCOSE 84 03/12/2023 1140   GLUCOSE 77 07/15/2012 1128   BUN 15 03/12/2023 1140   BUN 10 12/10/2021 0920   CREATININE 0.81 03/12/2023 1140   CREATININE 0.61 01/04/2015 1016   CALCIUM 9.3 03/12/2023 1140   PROT 7.3 03/15/2020 1545   ALBUMIN 4.6 03/15/2020 1545   AST 15 03/15/2020 1545   ALT 14 03/15/2020 1545   ALKPHOS 82 03/15/2020 1545   BILITOT 0.2 03/15/2020 1545   GFRNONAA 113 03/15/2020 1545   GFRAA 130 03/15/2020 1545      Latest Ref Rng & Units 03/12/2023   11:40 AM 12/10/2021    9:20 AM 03/15/2020    3:45 PM  BMP  Glucose 70 - 99 mg/dL 84  83  91   BUN 6 - 23 mg/dL 15  10  9    Creatinine 0.40 - 1.20 mg/dL 2.95  6.21  3.08   BUN/Creat Ratio 9 - 23  14  13    Sodium 135 - 145 mEq/L 139  142  143   Potassium 3.5 - 5.1 mEq/L 4.3  4.5  4.0   Chloride 96 - 112 mEq/L 105  102  106   CO2 19 -  32 mEq/L 26  24  21    Calcium 8.4 - 10.5 mg/dL 9.3  9.8  9.9        Component Value Date/Time   WBC 4.1 05/10/2018 0943   WBC 8.5 11/17/2017 2201   RBC 4.64 05/10/2018 0943   RBC 4.41 11/17/2017 2201   HGB 12.5 05/10/2018 0943   HCT 38.5 05/10/2018 0943   PLT 225 05/10/2018 0943   MCV 83 05/10/2018 0943   MCH 26.9 05/10/2018 0943   MCH 28.1 11/17/2017 2201   MCHC 32.5 05/10/2018 0943   MCHC 35.1 11/17/2017 2201   RDW 14.1 05/10/2018 0943   LYMPHSABS 1.3 05/10/2018 0943   MONOABS 1.0 11/17/2017 2201   EOSABS 0.1 05/10/2018 0943   BASOSABS 0.0 05/10/2018 0943   Lab Results  Component Value Date   TSH 0.73 03/12/2023   TSH 0.627 03/15/2020   TSH 0.691 12/10/2011   FREET4 0.86 03/12/2023         Parts of this note may have been dictated using voice recognition software. There may be variances in spelling and vocabulary which are unintentional. Not all errors are proofread. Please notify the Thereasa Parkin if any discrepancies are noted or if the meaning of any statement is not clear.

## 2023-07-12 ENCOUNTER — Encounter: Payer: Self-pay | Admitting: Family Medicine

## 2023-07-12 NOTE — Progress Notes (Signed)
Requested for completion via MyChart message. Form completed. Scan to media and sent back to her via Mychart message

## 2023-07-26 ENCOUNTER — Ambulatory Visit: Payer: Managed Care, Other (non HMO) | Admitting: Neurology

## 2023-08-17 ENCOUNTER — Encounter: Payer: Self-pay | Admitting: Family Medicine

## 2023-08-18 ENCOUNTER — Other Ambulatory Visit: Payer: Self-pay | Admitting: Family Medicine

## 2023-08-18 DIAGNOSIS — E785 Hyperlipidemia, unspecified: Secondary | ICD-10-CM

## 2023-08-18 NOTE — Progress Notes (Signed)
Hello Sarah Prince,   You can contact the office anytime to schedule this lab appointment.   Dr. Lum Babe  This MyChart message has not been read. August 17, 2023       08/17/23  2:19 PM Sarah Prince, CMA routed this conversation to [name]  to [facility] Rn Team (supporting You)  NT    08/17/23  2:11 PM Hi Doctor,    I need help. Below is the message I received from [facility]. can I schedule this lab asap.   Hi [place] Thank you for speaking with me today. The lipids that were too old for [place] to process were; total cholesterol, hdl, triglycerides, ldl, and tc/hdl ratio.   We can accept results taken 10/05/2022 or after. If you have any questions, please contact me at

## 2023-08-19 ENCOUNTER — Other Ambulatory Visit: Payer: Managed Care, Other (non HMO)

## 2023-08-19 DIAGNOSIS — E785 Hyperlipidemia, unspecified: Secondary | ICD-10-CM

## 2023-08-20 LAB — LIPID PANEL
Chol/HDL Ratio: 3.8 ratio (ref 0.0–4.4)
Cholesterol, Total: 236 mg/dL — ABNORMAL HIGH (ref 100–199)
HDL: 62 mg/dL (ref >39)
LDL Chol Calc (NIH): 165 mg/dL — ABNORMAL HIGH (ref 0–99)
Triglycerides: 54 mg/dL (ref 0–149)
VLDL Cholesterol Cal: 9 mg/dL (ref 5–40)

## 2023-09-06 ENCOUNTER — Ambulatory Visit (INDEPENDENT_AMBULATORY_CARE_PROVIDER_SITE_OTHER): Payer: Managed Care, Other (non HMO) | Admitting: Family Medicine

## 2023-09-06 VITALS — BP 116/70 | HR 90 | Temp 98.5°F | Ht 67.0 in | Wt 163.2 lb

## 2023-09-06 DIAGNOSIS — K6289 Other specified diseases of anus and rectum: Secondary | ICD-10-CM | POA: Diagnosis not present

## 2023-09-06 MED ORDER — HYDROCORTISONE 2.5 % EX CREA
TOPICAL_CREAM | Freq: Two times a day (BID) | CUTANEOUS | 0 refills | Status: AC
Start: 2023-09-06 — End: 2023-09-13

## 2023-09-06 NOTE — Patient Instructions (Addendum)
Bobbie Stack,  It was lovely seeing you in clinic today! You came in today with pain with defecation and some blood in your stool.  There are a few things for you to do after today's visit: I suspect that you have an external hemorrhoid. This is caused by dilation of blood vessels in the rectum due to constipation. Apply topical hydrocortisone 2.5% twice a day for the next 7 days. Do not apply for extended periods as it can cause skin thinning. Start taking miralax 1 capful mixed into 8oz of juice or other fluids. Take this once a day. Softening your stool can help heal the hemorrhoid. You can tylenol as needed for pain control as well  Please come back in 2 weeks if your symptoms are not improving.  Thank you for allowing [place] to be a part of your care team! Governor Rooks, medical student Dr. Lincoln Brigham, PGY2

## 2023-09-06 NOTE — Progress Notes (Signed)
    SUBJECTIVE:   CHIEF COMPLAINT / HPI:   Sarah Prince is a 37 y.o. female who presents today for blood in stool and pain with defecation.  For the past week (from 09/06/23), has had some blood in her stool and pain with having a BM and with certain movements. Bright red color; denies seeing dark/tarry color to stool. Also reports "extra skin" around her anus which is very painful to touch. Reports hx of hemorrhoids, never as bad as currently. Has been using a hemorrhoid cream OTC with temporary relief. She reports a long history of constipation. Has tried Miralax in the past but did not like the taste. Last BM this morning, was painful and had blood in stool and on toilet paper.   Denies abdominal pain, N/V. Trying to stay hydrated, reports she does not want to eat as much as she does not want to have BMs given the pain. No fevers, chills. Denies personal history of abdominal/GI disease, IBS, IBD. Denies family hx of IBS, IBD, GI disease. Denies Fhx of colon cancer.  PERTINENT  PMH / PSH: Migraine, hx of pituitary adenoma, sickle cell trait  OBJECTIVE:   BP 116/70   Pulse 90   Temp 98.5 F (36.9 C)   Ht 5\' 7"  (1.702 m)   Wt 163 lb 3.2 oz (74 kg)   SpO2 97%   BMI 25.56 kg/m   General: Pt seated in chair, no acute distress. Cardiovascular: RRR, no murmurs, rubs, gallops. Pulmonary: Normal work of breathing. Lungs clear to auscultation bilaterally. Abdomen: Normal bowel sounds. Soft and nondistended in all four quadrants. No tenderness to palpation; no rebound tenderness, no guarding. Rectal: Darker pigmented ~2cm mass in the 9-12 o'clock region. No blood visualized. No fissure visualized. Deferred palpation/internal rectal exam at pt's request due to pain. Neuro/Psych: Alert and oriented to person, place, event, time. Normal affect.  Dr Sherrilee Gilles was present as chaperone  ASSESSMENT/PLAN:   Assessment & Plan Rectal pain Pt reports 1 week of rectal pain worsened by defecation and  certain movements, and blood in stool. Longstanding hx of constipation. On physical exam, has possible external hemorrhoid versus skin tag in anal region. Unable to visualize any fissure; exam limited by pain. Given hx of constipation and exam findings, suspect symptoms likely related to external hemorrhoids. Cannot rule out anal fissure. Lower concern for colon cancer given pt's age and lack of other systemic symptoms; pt declines referral to GI for colonoscopy at this time. - Will prescribe hydrocortisone 2.5% cream to apply to hemorrhoid up to BID for 7 days - Instructed pt to start Miralax daily to help with regular BMs - Encouraged increased hydration - Pt will reach back out to the office if symptoms not improved in 2-4 weeks; can consider referral to Gen Surg versus GI at that time   Governor Rooks, Medical Student [facility]  I was personally present and performed or re-performed the history, physical exam and medical decision making activities of this service and have verified that the service and findings are accurately documented in the student's note.  Sarah Brigham, MD                  09/06/2023, 3:04 PM

## 2023-09-16 ENCOUNTER — Encounter: Payer: Self-pay | Admitting: Family Medicine

## 2023-09-16 NOTE — Telephone Encounter (Signed)
Patient is double booked for tomorrow at 1110

## 2023-09-17 ENCOUNTER — Telehealth: Payer: Managed Care, Other (non HMO) | Admitting: Family Medicine

## 2023-09-17 DIAGNOSIS — K649 Unspecified hemorrhoids: Secondary | ICD-10-CM | POA: Insufficient documentation

## 2023-09-17 DIAGNOSIS — K625 Hemorrhage of anus and rectum: Secondary | ICD-10-CM

## 2023-09-17 MED ORDER — HYDROCORTISONE ACETATE 25 MG RE SUPP: 25.0000 mg | Freq: Two times a day (BID) | RECTAL | 0 refills | Status: DC

## 2023-09-17 NOTE — Progress Notes (Signed)
   [facility] Telemedicine Visit  Patient consented to have virtual visit and was identified by name and date of birth. Method of visit: Video  Encounter participants: Patient: Sarah Prince - located at Home Provider: Janit Pagan - located at [facility] Others (if applicable): N/A  Chief Complaint: rectal pain  HPI:  Rectal Bleeding  Episode onset: Hx of hemorrhoid with blood in the stool. This started about 2-3 weeks ago. Episode frequency: She sees blood in her stool only when she poop. Whenever she moves her bowel, her hemorrhoid will come out and will see blood on her wipes. The problem has been gradually improving (She had constipation which pushes her hemorrhoid out. However, since she started using Miralax, that has improved. Pain improved but still has blood in her stool.). The pain is moderate. The stool is described as soft and mixed with blood. Prior successful therapies include diet changes, fiber and stool softeners. There was no prior unsuccessful therapy. Associated symptoms include rectal pain. Pertinent negatives include no abdominal pain, no diarrhea and no nausea. Associated symptoms comments: Rectal pain improved.. She has been Eating and drinking normally (She does not think she drinks enough water but have improved a bit in that regard). Urine output has been normal.    ROS: per HPI  Pertinent PMHx: PMHX reviewed  Exam:  There were no vitals taken for this visit.  Physical Exam Constitutional:      General: She is not in acute distress.    Appearance: She is not ill-appearing.  Pulmonary:     Comments:  She could complete full sentences Neurological:     Mental Status: She is alert.  Psychiatric:        Behavior: Behavior normal.     Assessment/Plan:  Hemorrhoid Symptoms improved, but still, some bleed with bowel movement Continue Miralax prn for stool softening transition to Sup Anusol BID - med escribed I discussed referral to GI  since this has been recurrent for many years She agreed with the plan The patient referred to GI F/U with me soon if symptoms worsens    Janit Pagan, MD [facility]    Time spent during visit with patient: 15 minutes

## 2023-09-17 NOTE — Patient Instructions (Addendum)
Hydrocortisone Suppositories What is this medication? HYDROCORTISONE (hye droe KOR ti sone) treats hemorrhoids or inflammatory bowel diseases. It works by decreasing inflammation. It belongs to a group of medications called steroids. This medicine may be used for other purposes; ask your health care provider or pharmacist if you have questions. COMMON BRAND NAME(S): Anucort-HC, Anumed-HC, Anusol HC, Encort, GRx HiCort, Hemmorex-HC, Hemorrhoidal-HC, Hemril, Proctocort, Proctosert HC, Proctosol-HC, Rectacort HC, Rectasol-HC What should I tell my care team before I take this medication? They need to know if you have any of these conditions: An unusual or allergic reaction to hydrocortisone, other medications, foods, dyes, or preservatives Pregnant or trying to get pregnant Breastfeeding How should I use this medication? This medication is for rectal use only. Do not take by mouth. Wash your hands before and after use. Take off the foil wrapping. Wet the tip of the suppository with cold tap water to make it easier to use. Lie on your side with your lower leg straightened out and your upper leg bent forward toward your stomach. Lift upper buttock to expose the rectal area. Apply gentle pressure to insert the suppository completely into the rectum, pointed end first. Hold buttocks together for a few seconds. Remain lying down for about 15 minutes to avoid having the suppository come out. Do not use more often than directed. Talk to your care team about the use of this medication in children. Special care may be needed. Overdosage: If you think you have taken too much of this medicine contact a poison control center or emergency room at once. NOTE: This medicine is only for you. Do not share this medicine with others. What if I miss a dose? If you miss a dose, use it as soon as you can. If it is almost time for your next dose, use only that dose. Do not use double or extra doses. What may interact with  this medication? Interactions are not expected. Do not use any other rectal products on the affected area without telling your care team. This list may not describe all possible interactions. Give your health care provider a list of all the medicines, herbs, non-prescription drugs, or dietary supplements you use. Also tell them if you smoke, drink alcohol, or use illegal drugs. Some items may interact with your medicine. What should I watch for while using this medication? Visit your care team for regular checks on your progress. Tell your care team if your symptoms do not improve after a few days of use. Do not use if there is blood in your stools. If you get any type of infection while using this medication, you may need to stop using this medication until our infections clears up. Ask your care team for advice. What side effects may I notice from receiving this medication? Side effects that you should report to your care team as soon as possible: Allergic reactions--skin rash, itching, hives, swelling of the face, lips, tongue, or throat Cushing syndrome--increased fat around the midsection, upper back, neck, or face, pink or purple stretch marks on the skin, thinning, fragile skin that easily bruises, unexpected hair growth High blood sugar (hyperglycemia)--increased thirst or amount of urine, unusual weakness or fatigue, blurry vision Increase in blood pressure Infection--fever, chills, cough, sore throat, wounds that don't heal, pain or trouble when passing urine, general feeling of discomfort or being unwell Low adrenal gland function--nausea, vomiting, loss of appetite, unusual weakness or fatigue, dizziness Mood and behavior changes--anxiety, nervousness, confusion, hallucinations, irritability, hostility, thoughts of suicide  or self-harm, worsening mood, feelings of depression Small, red, pus-filled bumps on skin around hair follicles Stomach bleeding--bloody or black, tar-like stools,  vomiting blood or brown material that looks like coffee grounds Swelling of the ankles, hands, or feet Side effects that usually do not require medical attention (report these to your care team if they continue or are bothersome): Change in skin color General discomfort and fatigue Irritation at application site Rectal pain, burning, or bleeding after use This list may not describe all possible side effects. Call your doctor for medical advice about side effects. You may report side effects to FDA at 1-800-FDA-1088. Where should I keep my medication? Keep out of the reach of children. Store at room temperature between 20 and 25 degrees C (68 and 77 degrees F). Protect from heat and freezing. Throw away any unused medication after the expiration date. NOTE: This sheet is a summary. It may not cover all possible information. If you have questions about this medicine, talk to your doctor, pharmacist, or health care provider.  2024 Elsevier/Gold Standard (2022-05-01 00:00:00)

## 2023-09-17 NOTE — Assessment & Plan Note (Signed)
Symptoms improved, but still, some bleed with bowel movement Continue Miralax prn for stool softening transition to Sup Anusol BID - med escribed I discussed referral to GI since this has been recurrent for many years She agreed with the plan The patient referred to GI F/U with me soon if symptoms worsens

## 2023-10-13 ENCOUNTER — Other Ambulatory Visit: Payer: Self-pay

## 2023-10-13 DIAGNOSIS — Z86018 Personal history of other benign neoplasm: Secondary | ICD-10-CM

## 2023-10-22 ENCOUNTER — Other Ambulatory Visit: Payer: Managed Care, Other (non HMO)

## 2023-10-23 LAB — PROLACTIN: Prolactin: 51.3 ng/mL — ABNORMAL HIGH

## 2023-10-28 ENCOUNTER — Ambulatory Visit: Payer: Managed Care, Other (non HMO) | Admitting: Dermatology

## 2023-10-28 ENCOUNTER — Encounter: Payer: Self-pay | Admitting: Dermatology

## 2023-10-28 VITALS — BP 152/100

## 2023-10-28 DIAGNOSIS — L6681 Central centrifugal cicatricial alopecia: Secondary | ICD-10-CM | POA: Diagnosis not present

## 2023-10-28 DIAGNOSIS — L658 Other specified nonscarring hair loss: Secondary | ICD-10-CM | POA: Diagnosis not present

## 2023-10-28 MED ORDER — SAFETY SEAL MISCELLANEOUS MISC: 2 refills | Status: DC

## 2023-10-28 NOTE — Patient Instructions (Addendum)
Hello Ms. Laney Potash,  Thank you for visiting my office today. Your proactive approach to addressing your concerns about hair thinning is commendable, and I am here to support you through this journey.  Here is a summary of the key instructions and recommendations from today's consultation:  Diagnosis: CCCA (Central Centrifugal Cicatricial Alopecia)  Medications Prescribed:   Clobetasol Minoxidil Compound: Apply every morning directed to the affected areas of the scalp.     Supplements:   Vitamin D3: Continue taking daily.   Viviscal: Take 2 tablets daily with breakfast.   Collagen Supplement (Vital Proteins): Recommended for hair strength. Use the flavorless powder in coffee or a smoothie.  Follow-Up:   Initial photographs of your scalp have been taken today. We will compare these at your follow-up visit in four months to assess improvement.   Pharmacy Coordination:   [facility] will reach out to verify your address and process payment for the prescribed medications.  Please adhere to the treatment instructions and take your supplements regularly for the best outcomes. Our goal is to halt further hair loss and encourage regrowth where possible.  I look forward to observing the progress at your next appointment. Should you have any questions or concerns before then, please do not hesitate to contact our office.  Warm regards,  Dr. Langston Reusing, Dermatology        CCCA  What is central centrifugal cicatricial alopecia? Central Centrifugal Cicatricial Alopecia (CCCA) is a form of scarring alopecia on the scalp that results in permanent hair loss. It is the most common form of scarring hair loss seen in black women. However, it may be seen in men and among persons of all races and hair colour (though rarely). Middle-aged women are most commonly affected.  What is the cause of central centrifugal cicatricial alopecia? The exact cause of CCCA is unknown and is likely  multifactorial. A genetic component has been suggested, with a link to mutations of the gene PADI3, which encodes peptidyl arginine deiminase, type III (PADI3), an enzyme that modifies proteins that are essential to formation of the hair-shaft. Hair care practices, such as the use of the hot comb, relaxers, tight extensions and weaves, have been implicated for decades, but studies have not shown a consistent link. Other proposed causative factors include fungal infections, bacterial infections, autoimmune disease, and genetics. One study has shown an association with medical conditions such as type 2 diabetes mellitus.  What patients often ask their dermatologist about CCCA Board-certified dermatologists are the medical doctors who have the most experience diagnosing and treating hair loss, including CCCA. When patients see their dermatologist about CCCA, they often ask the following questions.  Why am I going bald in the center of my head? This type of hair loss often begins in the center of the scalp as a small, balding, and round patch that grows over time.  While more common in Black women, this type of hair loss develops in men and people of all races.  Central centrifugal cicatricial alopecia (CCCA) The first sign of CCCA is often noticeable hair loss in the center, or crown, of your scalp.  Woman with CCCA has hair loss in the center of her scalp If you have this type of hair loss, you want to treat it early. Starting treatment early can prevent CCCA from spreading outward and causing more permanent hair loss. Some people also have hair regrowth when treatment starts early.  Early treatment is important because this disease destroys hair follicles. These  are tiny pores (or openings) in your scalp from which your hair grows. Once a hair follicle has been destroyed, it is replaced by scar tissue. This is why hair loss can be permanent.  You can tell when scarring develops by looking at your  scalp. After many hair follicles develop scars, you'll have a bald area that feels smooth to the touch.  Can CCCA be reversed? You may be able to reverse (or grow some hair) if you treat CCCA early before hair follicles develop scars. Once a hair follicle scars completely, treatment to regrow hair becomes difficult and hair loss is more likely to be permanent.  While treatment for CCCA may not always be able to reverse the disease and regrow hair, treatment can prevent CCCA from destroying more hair follicles. This means that the patch of hair loss that you have can remain the same size instead of getting larger. Without treatment, CCCA often continues to destroy hair follicles, and the patch of hair loss becomes larger and may eventually involve most of the scalp.  How is CCCA treated? You cannot effectively treat CCCA with hair loss treatments that you can buy online or in stores. A dermatologist must prescribe medication to treat this type of hair loss.  A dermatologist can also give you self-care tips that can make treatment more effective.  Even if you don't want to treat the hair loss, it's important to see a board-certified dermatologist if you have patchy hair loss in the center of your head. Occasionally, CCCA is a sign of a medical problem like a thyroid condition. The hair loss could also be a sign that you need more iron or certain vitamins.  If you have noticeable hair loss on the top of your head, dermatologists encourage you to make an appointment today. As tempting as it can be to hide a small area of hair loss, remember that the small area tends to get larger and larger without treatment.  Why does the baldness start from the top of the head? Exactly why CCCA usually starts on the top of the head is not completely understood.  In studying CCCA, dermatologists have learned that it is a unique type of hair loss that usually starts on the top of the head. As CCCA progresses, the  round patch grows.  Studies have also found that:  Where this type of hair loss develops, there's inflammation.  CCCA is the most common type of scarring hair loss for women of African descent.  In the [place], it's the most frequent cause of scarring hair loss in [place] American women and usually begins during middle age.  CCCA runs in families.  Noticeable hair loss is one sign of CCCA. Some people feel small, raised bumps on their scalp. Many people who have untreated CCCA say that their scalp burns, stings, or itches.  You may develop other signs or symptoms. You'll find more information about these, along with pictures at Central centrifugal cicatricial alopecia: Symptoms.   Important Information  Due to recent changes in healthcare laws, you may see results of your pathology and/or laboratory studies on MyChart before the doctors have had a chance to review them. We understand that in some cases there may be results that are confusing or concerning to you. Please understand that not all results are received at the same time and often the doctors may need to interpret multiple results in order to provide you with the best plan of care or course of  treatment. Therefore, we ask that you please give [place] 2 business days to thoroughly review all your results before contacting the office for clarification. Should we see a critical lab result, you will be contacted sooner.   If You Need Anything After Your Visit  If you have any questions or concerns for your doctor, please call our main line at [phone] If no one answers, please leave a voicemail as directed and we will return your call as soon as possible. Messages left after 4 pm will be answered the following business day.   You may also send [place] a message via MyChart. We typically respond to MyChart messages within 1-2 business days.  For prescription refills, please ask your pharmacy to contact our office. Our fax number is  [phone].  If you have an urgent issue when the clinic is closed that cannot wait until the next business day, you can page your doctor at the number below.    Please note that while we do our best to be available for urgent issues outside of office hours, we are not available 24/7.   If you have an urgent issue and are unable to reach [place], you may choose to seek medical care at your doctor's office, retail clinic, urgent care center, or emergency room.  If you have a medical emergency, please immediately call 911 or go to the emergency department. In the event of inclement weather, please call our main line at [phone] for an update on the status of any delays or closures.  Dermatology Medication Tips: Please keep the boxes that topical medications come in in order to help keep track of the instructions about where and how to use these. Pharmacies typically print the medication instructions only on the boxes and not directly on the medication tubes.   If your medication is too expensive, please contact our office at [phone] or send [place] a message through MyChart.   We are unable to tell what your co-pay for medications will be in advance as this is different depending on your insurance coverage. However, we may be able to find a substitute medication at lower cost or fill out paperwork to get insurance to cover a needed medication.   If a prior authorization is required to get your medication covered by your insurance company, please allow [place] 1-2 business days to complete this process.  Drug prices often vary depending on where the prescription is filled and some pharmacies may offer cheaper prices.  The website www.goodrx.com contains coupons for medications through different pharmacies. The prices here do not account for what the cost may be with help from insurance (it may be cheaper with your insurance), but the website can give you the price if you did not use any insurance.  -  You can print the associated coupon and take it with your prescription to the pharmacy.  - You may also stop by our office during regular business hours and pick up a GoodRx coupon card.  - If you need your prescription sent electronically to a different pharmacy, notify our office through [facility] or by phone at [phone]

## 2023-10-28 NOTE — Progress Notes (Unsigned)
   New Patient Visit   Subjective  Sarah Prince is a 38 y.o. female who presents for the following: New Pt - Alopecia  Patient states she has alopecia located at the scalp that she would like to have examined. Patient reports the areas have been there for a while but has been more noticeable over the last 1 year. She reports the areas are not bothersome.Patient rates irritation 0 out of 10. She states that the areas have not spread. Patient reports she has previously been treated for these areas. Patient denies Hx of bx. Patient denies family history of skin cancer(s).  The patient has spots, moles and lesions to be evaluated, some may be new or changing and the patient may have concern these could be cancer.   The following portions of the chart were reviewed this encounter and updated as appropriate: medications, allergies, medical history  Review of Systems:  No other skin or systemic complaints except as noted in HPI or Assessment and Plan.  Objective  Well appearing patient in no apparent distress; mood and affect are within normal limits.   A focused examination was performed of the following areas: hair & scalp   Relevant exam findings are noted in the Assessment and Plan.           Assessment & Plan   CCCA Exam: ***  Treatment Plan: - Rx [employer] - AA Gel - use daily QAM. - Recommended taking OTC supplements Vital Proteins and Viviscal or Nutrafol. Pic included in AVS.  CENTRAL CENTRIFUGAL CICATRICIAL ALOPECIA   Related Medications Safety Seal Miscellaneous MISC AA Gel with minoxidil USP 10% clobetasol USP 0.05% - apply to affected areas daily every morning  Return in about 4 months (around 02/25/2024) for CCCA.    Documentation: I have reviewed the above documentation for accuracy and completeness, and I agree with the above.   I, Bernece Gall Marcha Solders, CMA, am acting as scribe for [employer], DO.   Langston Reusing, DO

## 2023-10-29 ENCOUNTER — Ambulatory Visit (INDEPENDENT_AMBULATORY_CARE_PROVIDER_SITE_OTHER): Payer: Managed Care, Other (non HMO) | Admitting: "Endocrinology

## 2023-10-29 ENCOUNTER — Encounter: Payer: Self-pay | Admitting: "Endocrinology

## 2023-10-29 VITALS — BP 118/64 | HR 82 | Ht 67.0 in | Wt 156.4 lb

## 2023-10-29 DIAGNOSIS — E236 Other disorders of pituitary gland: Secondary | ICD-10-CM | POA: Diagnosis not present

## 2023-10-29 DIAGNOSIS — D352 Benign neoplasm of pituitary gland: Secondary | ICD-10-CM

## 2023-10-29 MED ORDER — CABERGOLINE 0.5 MG PO TABS: 0.2500 mg | ORAL_TABLET | ORAL | 0 refills | Status: DC

## 2023-10-29 NOTE — Progress Notes (Signed)
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 04/06/23 MRI brain with and without contrast reported partially empty sella, which is often a normal anatomic variant  Ordered neurology referral. Seen by neurology 04/13/23:  1. Migraine with and without aura, without status migrainosus, not intractable 2. 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 Other MRI findings (partial empty sella and posterior fossa arachnoid cyst) benign incidental findings.    Patient has been off of cabergoline 0.5 mg 1 tablet twice a week since 2022 Currently has increase in headaches with elevated prolactin level mildly without galactorrhea/amenorrhea; however patient is interested in resuming treatment to see if it improves her head aches  Other pituitary hormones in acceptable range 10/29/23: start cabergoline 0.25 mg twice a week (Mon and Fri) with repeat prolactin in 3 mo Instructed patient to maintain head ache log, as that is the only current complaint  Patient understands he S/E  Return in about 3 months (around 01/27/2024) for visit and 8 am labs before next visit.   I have reviewed current medications, nurse's notes, allergies, vital signs, past medical and surgical history, family medical history, and social history for this encounter. Counseled patient on symptoms, examination findings, lab findings, imaging results, treatment  decisions and monitoring and prognosis. The patient understood the recommendations and agrees with the treatment plan. All questions regarding treatment plan were fully answered.  Altamese [place], MD  10/29/23   History of Present Illness HPI   Emaleigh Guimond is a 38 y.o. 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 Had been on cabergoline since 07/2002.   Current history: stills getting head aches which have increased and don't match up to migraine description, no longer having blurry vision  Has regular menstrual cycle  No nausea/vomiting/dizziness/galactorrhea  Last cabergoline in 2022, doesn't remember last dose-couldn't take due to cost Starts appointment with [facility] in 04/2023 Has two children   Prior history:  Started Dostinex (cabergoline) in [place], [place] in 10/03 (at age 34).  MRI in 2003 revealed a 13 x 15 mm hemorrhagic macroadenoma. MRI in 5/10 showed much smaller (4 mm) pituitary microadenoma.   Dostinex was stopped periodically, including in 11/08, but prolactin level rose and menses again became irregular each time.  She was previously followed by endocrinologist Sabino Donovan, MD in [facility] [place] [facility]) prior to moving to [place] in 2010.   Cabergoline (resumed in 12/20) 0.5 mg 1 tablet twice a week. (Prior cabergoline was stopped after 2009 pregnancy. She resumed it in 2/16 at 0.5 mg twice a week due to prolactin level higher at 68.6 but she stopped it by 5/16 due to deductible cost. Resumed in 9/18 but she stopped it in 4/20 due to "stressed." She resumed it right after 09/07/19 lab).  She started seeing Noland Fordyce, MD in about 2/18 and has had infertility treatments  including clomiphene (last in about 5/18) and letrozole (last in about 7/18). She had a prolactin level of 70.1 per K. Ernestina Penna, MD on 04/27/17 (likely was in early pregnancy). She had a miscarriage at about [redacted] weeks gestation in  8/18. She then saw seeing Fermin Schwab, MD [facility]).  Records: Menarche age 44. Menses were initially regular.  Prolactinoma diagnosed in 2003 (age 37) when she presented with amenorrhea, dizziness and headaches. MRI at that time revealed a 13 x 15 mm hemorrhagic macroadenoma. Started Dostinex (cabergoline) in [place], [place] in 10/03 (at age 42). Regular menses resumed a few months later. Headaches and dizziness then resolved. MRI in 2/04 and 8/04 showed a smaller 10 x 10 x 10 mm pituitary adenoma. [place]. Dostinex was stopped periodically, including in 11/08, but prolactin level rose and menses again became irregular each time. Prolactin level was 69 in 2/09 off Dostinex for 3 months. Resumed Dostinex in 3/09. She then became pregnant (pregnancy 1) and stopped Dostinex in 5/09. Prolactin was 106 in 6/09 (early pregnancy; so partly physiologic elevation). At that time TSH was normal at 0.538 and cortisol was normal at 8.7. Baby was delivered at [redacted] weeks gestation in 11/09 requiring one month in the NICU. She breast fed after that pregnancy without difficulty. She remained off cabergoline after 2009 pregnancy. Prolactin level was 86 off Rx in 3/10. MRI in 5/10 showed much smaller (4 mm) pituitary microadenoma.  She was previously followed by endocrinologist Sabino Donovan, MD in [facility] [place] [facility]) prior to moving to [place] in 2010. Prolactin level was 29.4 off Rx in 9/10, 33.3 off Rx in 3/11 (at initial endocrinology consultation with me; which we decided to continue to monitor without treatment). Prolactin level was 30.5 off Rx in 3/12, 34.4 off Rx in 3/13. TSH was 0.691 in 3/13. Pregnancy 2 was diagnosed on 02/02/12. Prolactin level was 21.0 (2.8-29.2) off Rx in 1/15. Prolactin level 68.6 in 2/16 (off Rx). She was instructed then to resume cabergoline 0.5 mg twice a week; 3 month follow-up was planned but she  did not keep that appt. Prolactin level 83.3 in 4/17 (off Rx). She was seen on 02/04/16; reported then she had stopped the cabergoline by 5/16 due to cost (deductible). I then instructed her to resume cabergoline, but she did not do so until 10/17 when she took it for a month in anticipation of a prolactin level being checked elsewhere; she says that level was normal so on her own she decided to stop the cabergoline (since her periods had been regular in 2017 she thought she was more likely to be able to get pregnant off it). She started seeing Noland Fordyce, MD in about 2/18 and has had infertility treatments including clomiphene (last in about 5/18) and letrozole (last in about 7/18). She had a prolactin level of 70.1 per K. Ernestina Penna, MD on 04/27/17 (likely was in early pregnancy). She had a miscarriage at about [redacted] weeks gestation in 8/18. She is now seeing Fermin Schwab, MD [facility]). Meds:  Cabergoline (resumed in 12/20) 0.5 mg 1 tablet twice a week. (Prior cabergoline was stopped after 2009 pregnancy. She resumed it in 2/16 at 0.5 mg twice a week due to prolactin level higher at 68.6 but she stopped it by 5/16 due to deductible cost. Resumed in 9/18 but she stopped it in 4/20 due to "stressed." She resumed it right after 09/07/19 lab). (Prior Femara [letrozole] - holding again since  4/20). (Prior Clomid [clomiphene] - holding since 4/20). (Prior Tri-Sprintec). (Prior Implanon removed in 8/12). (Prior Depo-Provera stopped due to daily bleeding). Symptoms:  History of chronic oligomenorrhea. Menses were regular when she took BCP regularly, irregular when she forgot BCP at times in the past. Since early 2017 menses have been quite regular, every 28-31 days. She never had any galactorrhea.  No significant headaches. No visual field cuts. Lab:  Prolactin levels as above. She states prolactin level was about 36 in about 5/19 at which time she was frequently missing  doses. Prolactin level 9.9 on 05/10/18 on cabergoline 0.5 mg weekly. Prolactin level 40.0 on 09/07/19 off Rx. Prolactin level 5.6 on 11/13/19 on cabergoline 0.5 mg twice a week. Assessment:  Prolactin level was higher in 2016-2018 (unclear whether she was pregnant at the time of the lab in 7/18; subsequent miscarriage in 8/18; not as high as at times in the past), then in good control on cabergoline; then 40.0 in 12/20 prior to resuming cabergoline; now in good control on cabergoline.  Physical Exam  BP 118/64   Pulse 82   Ht 5\' 7"  (1.702 m)   Wt 156 lb 6.4 oz (70.9 kg)   SpO2 97%   BMI 24.50 kg/m    Constitutional: well developed, well nourished Head: normocephalic, atraumatic Eyes: sclera anicteric, no redness Neck: supple Lungs: normal respiratory effort Neurology: alert and oriented Skin: dry, no appreciable rashes Musculoskeletal: no appreciable defects Psychiatric: normal mood and affect   Current Medications Patient's Medications  New Prescriptions   CABERGOLINE (DOSTINEX) 0.5 MG TABLET    Take 0.5 tablets (0.25 mg total) by mouth 2 (two) times a week.  Previous Medications   CHOLECALCIFEROL (VITAMIN D) 1000 UNITS TABLET    Take 1,000 Units by mouth daily.   FLUOCINONIDE (LIDEX) 0.05 % EXTERNAL SOLUTION    Apply 1 Application topically 2 (two) times daily. Scalp solution for hair loss   HYDROCORTISONE (ANUSOL-HC) 25 MG SUPPOSITORY    Place 1 suppository (25 mg total) rectally 2 (two) times daily.   POLYETHYLENE GLYCOL (MIRALAX / GLYCOLAX) 17 G PACKET    Take 17 g by mouth daily.   SAFETY SEAL MISCELLANEOUS MISC    AA Gel with minoxidil USP 10% clobetasol USP 0.05% - apply to affected areas daily every morning  Modified Medications   No medications on file  Discontinued Medications   No medications on file    Allergies No Known Allergies  Past Medical History Past Medical History:  Diagnosis Date   Dyspnea 04/13/2020   Leg swelling 04/13/2020   Preterm delivery      Past Surgical History Past Surgical History:  Procedure Laterality Date   NO PAST SURGERIES      Family History family history includes Diabetes in her mother.  Social History Social History   Socioeconomic History   Marital status: Married    Spouse name: Not on file   Number of children: Not on file   Years of education: Not on file   Highest education level: Not on file  Occupational History   Not on file  Tobacco Use   Smoking status: Never   Smokeless tobacco: Never  Vaping Use   Vaping status: Never Used  Substance and Sexual Activity   Alcohol use: No   Drug use: No   Sexual activity: Not on file  Other Topics Concern   Not on file  Social History Narrative   Pt lives with her son Melina Modena) born in November 2009  and daughter born in 2014.  and father of baby [facility] [place]).  Works in business.  Degree in business.    Parents live in [place].  Moved to [place] in 2005, travels back to [place], has also previously lived in [place].   Social Drivers of Corporate investment banker Strain: Not on file  Food Insecurity: Not on file  Transportation Needs: Not on file  Physical Activity: Not on file  Stress: Not on file  Social Connections: Not on file  Intimate Partner Violence: Not on file    Lab Results  Component Value Date   CHOL 236 (H) 08/19/2023   Lab Results  Component Value Date   HDL 62 08/19/2023   Lab Results  Component Value Date   LDLCALC 165 (H) 08/19/2023   Lab Results  Component Value Date   TRIG 54 08/19/2023   Lab Results  Component Value Date   CHOLHDL 3.8 08/19/2023   Lab Results  Component Value Date   CREATININE 0.81 03/12/2023   Lab Results  Component Value Date   GFR 94.31 03/12/2023      Component Value Date/Time   NA 139 03/12/2023 1140   NA 142 12/10/2021 0920   K 4.3 03/12/2023 1140   CL 105 03/12/2023 1140   CO2 26 03/12/2023 1140   GLUCOSE 84 03/12/2023 1140   GLUCOSE 77 07/15/2012 1128    BUN 15 03/12/2023 1140   BUN 10 12/10/2021 0920   CREATININE 0.81 03/12/2023 1140   CREATININE 0.61 01/04/2015 1016   CALCIUM 9.3 03/12/2023 1140   PROT 7.3 03/15/2020 1545   ALBUMIN 4.6 03/15/2020 1545   AST 15 03/15/2020 1545   ALT 14 03/15/2020 1545   ALKPHOS 82 03/15/2020 1545   BILITOT 0.2 03/15/2020 1545   GFRNONAA 113 03/15/2020 1545   GFRAA 130 03/15/2020 1545      Latest Ref Rng & Units 03/12/2023   11:40 AM 12/10/2021    9:20 AM 03/15/2020    3:45 PM  BMP  Glucose 70 - 99 mg/dL 84  83  91   BUN 6 - 23 mg/dL 15  10  9    Creatinine 0.40 - 1.20 mg/dL 8.11  9.14  7.82   BUN/Creat Ratio 9 - 23  14  13    Sodium 135 - 145 mEq/L 139  142  143   Potassium 3.5 - 5.1 mEq/L 4.3  4.5  4.0   Chloride 96 - 112 mEq/L 105  102  106   CO2 19 - 32 mEq/L 26  24  21    Calcium 8.4 - 10.5 mg/dL 9.3  9.8  9.9        Component Value Date/Time   WBC 4.1 05/10/2018 0943   WBC 8.5 11/17/2017 2201   RBC 4.64 05/10/2018 0943   RBC 4.41 11/17/2017 2201   HGB 12.5 05/10/2018 0943   HCT 38.5 05/10/2018 0943   PLT 225 05/10/2018 0943   MCV 83 05/10/2018 0943   MCH 26.9 05/10/2018 0943   MCH 28.1 11/17/2017 2201   MCHC 32.5 05/10/2018 0943   MCHC 35.1 11/17/2017 2201   RDW 14.1 05/10/2018 0943   LYMPHSABS 1.3 05/10/2018 0943   MONOABS 1.0 11/17/2017 2201   EOSABS 0.1 05/10/2018 0943   BASOSABS 0.0 05/10/2018 0943   Lab Results  Component Value Date   TSH 0.73 03/12/2023   TSH 0.627 03/15/2020   TSH 0.691 12/10/2011   FREET4 0.86 03/12/2023  Parts of this note may have been dictated using voice recognition software. There may be variances in spelling and vocabulary which are unintentional. Not all errors are proofread. Please notify the Thereasa Parkin if any discrepancies are noted or if the meaning of any statement is not clear.

## 2023-11-04 ENCOUNTER — Encounter: Payer: Self-pay | Admitting: Gastroenterology

## 2023-11-15 ENCOUNTER — Encounter: Payer: Self-pay | Admitting: Family Medicine

## 2023-12-15 ENCOUNTER — Ambulatory Visit: Payer: Managed Care, Other (non HMO) | Admitting: Gastroenterology

## 2023-12-17 ENCOUNTER — Ambulatory Visit: Payer: Managed Care, Other (non HMO) | Admitting: Gastroenterology

## 2023-12-17 ENCOUNTER — Encounter: Payer: Self-pay | Admitting: Gastroenterology

## 2023-12-17 VITALS — BP 100/62 | HR 80 | Ht 67.0 in | Wt 155.4 lb

## 2023-12-17 DIAGNOSIS — K6289 Other specified diseases of anus and rectum: Secondary | ICD-10-CM

## 2023-12-17 DIAGNOSIS — K5909 Other constipation: Secondary | ICD-10-CM

## 2023-12-17 DIAGNOSIS — K5900 Constipation, unspecified: Secondary | ICD-10-CM

## 2023-12-17 DIAGNOSIS — K625 Hemorrhage of anus and rectum: Secondary | ICD-10-CM

## 2023-12-17 DIAGNOSIS — K649 Unspecified hemorrhoids: Secondary | ICD-10-CM

## 2023-12-17 MED ORDER — HYDROCORTISONE ACETATE 25 MG RE SUPP: 25.0000 mg | Freq: Two times a day (BID) | RECTAL | 0 refills | Status: DC

## 2023-12-17 MED ORDER — AMBULATORY NON FORMULARY MEDICATION: 1 refills | Status: AC

## 2023-12-17 NOTE — Progress Notes (Signed)
 Chief Complaint: Rectal bleeding and discomfort Primary GI MD: Gentry Fitz  HPI: 38 year old female with medical history as listed below presents for evaluation of rectal bleeding.  Seen by PCP 09/2023 and reporting Blood per rectum and rectal discomfort worsened by defecation.  She was given hydrocortisone 2.5% cream, instructed to use MiraLAX, and she was subsequently referred to GI for further evaluation   She has experienced rectal bleeding and discomfort since at least December, attributed to hemorrhoids. She was prescribed Miralax, a topical cream, and suppositories by her primary care provider. The cream provided some relief, but she initially found suppositories difficult to use due to severe discomfort. Symptoms improve with treatment but recur, especially with constipation. Two days ago, she noticed blood in her stool again, associated with slight constipation. Bleeding occurs with pressure during bowel movements, even if the stool is not particularly hard.  Constipation has been a lifelong issue, exacerbated during her menstrual cycle. She uses Miralax as needed but finds it challenging to balance the dosage, as too much leads to frequent bowel movements, while too little results in constipation. She has tried fiber supplements in the past without success, citing a dislike for the taste. Currently, she uses Miralax by 'eyeballing' the amount rather than measuring it precisely.   In terms of social history, she acknowledges not drinking much water daily, which may contribute to her constipation. No pain was reported during the review of symptoms.     Denies weight loss and family history of colon cancer  Past Medical History:  Diagnosis Date   Dyspnea 04/13/2020   Leg swelling 04/13/2020   Preterm delivery     Past Surgical History:  Procedure Laterality Date   NO PAST SURGERIES      Current Outpatient Medications  Medication Sig Dispense Refill   AMBULATORY NON FORMULARY  MEDICATION Medication Name: Diltiazem 2%/Lidocaine 2%   Using your index finger apply a small amount of medication inside the anal opening and to the external anal area twice daily x 6 weeks. 30 g 1   cabergoline (DOSTINEX) 0.5 MG tablet Take 0.5 tablets (0.25 mg total) by mouth 2 (two) times a week. 10 tablet 0   cholecalciferol (VITAMIN D) 1000 units tablet Take 1,000 Units by mouth daily.     fluocinonide (LIDEX) 0.05 % external solution Apply 1 Application topically 2 (two) times daily. Scalp solution for hair loss 60 mL 0   hydrocortisone (ANUSOL-HC) 25 MG suppository Place 1 suppository (25 mg total) rectally 2 (two) times daily. 12 suppository 0   hydrocortisone (ANUSOL-HC) 25 MG suppository Place 1 suppository (25 mg total) rectally 2 (two) times daily for 14 days. 28 suppository 0   polyethylene glycol (MIRALAX / GLYCOLAX) 17 g packet Take 17 g by mouth daily.     Safety Seal Miscellaneous MISC AA Gel with minoxidil USP 10% clobetasol USP 0.05% - apply to affected areas daily every morning 30 g 2   No current facility-administered medications for this visit.    Allergies as of 12/17/2023   (No Known Allergies)    Family History  Problem Relation Age of Onset   Diabetes Mother    Lung disease Neg Hx     Social History   Socioeconomic History   Marital status: Married    Spouse name: Not on file   Number of children: 2   Years of education: Not on file   Highest education level: Not on file  Occupational History   Not on file  Tobacco  Use   Smoking status: Never   Smokeless tobacco: Never  Vaping Use   Vaping status: Never Used  Substance and Sexual Activity   Alcohol use: No   Drug use: No   Sexual activity: Not on file  Other Topics Concern   Not on file  Social History Narrative   Pt lives with her son Melina Modena) born in November 2009 and daughter born in 2014.  and father of baby [facility] [place]).  Works in business.  Degree in business.    Parents live  in [place].  Moved to [place] in 2005, travels back to [place], has also previously lived in [place].   Social Drivers of Corporate investment banker Strain: Not on file  Food Insecurity: Not on file  Transportation Needs: Not on file  Physical Activity: Not on file  Stress: Not on file  Social Connections: Not on file  Intimate Partner Violence: Not on file    Review of Systems:    Constitutional: No weight loss, fever, chills, weakness or fatigue HEENT: Eyes: No change in vision               Ears, Nose, Throat:  No change in hearing or congestion Skin: No rash or itching Cardiovascular: No chest pain, chest pressure or palpitations   Respiratory: No SOB or cough Gastrointestinal: See HPI and otherwise negative Genitourinary: No dysuria or change in urinary frequency Neurological: No headache, dizziness or syncope Musculoskeletal: No new muscle or joint pain Hematologic: No bleeding or bruising Psychiatric: No history of depression or anxiety    Physical Exam:  Vital signs: BP 100/62   Pulse 80   Ht 5\' 7"  (1.702 m)   Wt 155 lb 6 oz (70.5 kg)   LMP 11/24/2023 (Exact Date)   SpO2 98%   BMI 24.34 kg/m   Constitutional: NAD, Well developed, Well nourished, alert and cooperative Head:  Normocephalic and atraumatic. Eyes:   PEERL, EOMI. No icterus. Conjunctiva pink. Respiratory: Respirations even and unlabored. Lungs clear to auscultation bilaterally.   No wheezes, crackles, or rhonchi.  Cardiovascular:  Regular rate and rhythm. No peripheral edema, cyanosis or pallor.  Gastrointestinal:  Soft, nondistended, nontender. No rebound or guarding. Hypoactive bowel sounds. No appreciable masses or hepatomegaly. Rectal:  Denyzha CMA chaperone. Small nonthrombosed external hemorrhoid noted. No obvious fissures. Patient declined anoscopy. DRE limited. Severe discomfort and hesitancy upon insertion of gloved finger. DRE aborted due to discomfort Msk:  Symmetrical without gross  deformities. Without edema, no deformity or joint abnormality.  Neurologic:  Alert and  oriented x4;  grossly normal neurologically.  Skin:   Dry and intact without significant lesions or rashes. Psychiatric: Oriented to person, place and time. Demonstrates good judgement and reason without abnormal affect or behaviors.   RELEVANT LABS AND IMAGING: CBC    Component Value Date/Time   WBC 4.1 05/10/2018 0943   WBC 8.5 11/17/2017 2201   RBC 4.64 05/10/2018 0943   RBC 4.41 11/17/2017 2201   HGB 12.5 05/10/2018 0943   HCT 38.5 05/10/2018 0943   PLT 225 05/10/2018 0943   MCV 83 05/10/2018 0943   MCH 26.9 05/10/2018 0943   MCH 28.1 11/17/2017 2201   MCHC 32.5 05/10/2018 0943   MCHC 35.1 11/17/2017 2201   RDW 14.1 05/10/2018 0943   LYMPHSABS 1.3 05/10/2018 0943   MONOABS 1.0 11/17/2017 2201   EOSABS 0.1 05/10/2018 0943   BASOSABS 0.0 05/10/2018 0943    CMP     Component Value Date/Time  NA 139 03/12/2023 1140   NA 142 12/10/2021 0920   K 4.3 03/12/2023 1140   CL 105 03/12/2023 1140   CO2 26 03/12/2023 1140   GLUCOSE 84 03/12/2023 1140   GLUCOSE 77 07/15/2012 1128   BUN 15 03/12/2023 1140   BUN 10 12/10/2021 0920   CREATININE 0.81 03/12/2023 1140   CREATININE 0.61 01/04/2015 1016   CALCIUM 9.3 03/12/2023 1140   PROT 7.3 03/15/2020 1545   ALBUMIN 4.6 03/15/2020 1545   AST 15 03/15/2020 1545   ALT 14 03/15/2020 1545   ALKPHOS 82 03/15/2020 1545   BILITOT 0.2 03/15/2020 1545   GFRNONAA 113 03/15/2020 1545   GFRAA 130 03/15/2020 1545     Assessment/Plan:      Chronic Constipation Chronic constipation exacerbated by inadequate hydration and inconsistent Miralax use. Benefiber recommended as a tasteless alternative. Patient would prefer to avoid prescriptions if possible. Normal TSH. - Recommend Benefiber, one to three times daily for two weeks. - Advise accurate Miralax measurement, using a spoonful every other day if needed and titrate dose on response. - Encourage  increased hydration, targeting half of body weight in ounces daily, minimum 64 ounces. - Discuss potential Linzess use if constipation persists, noting initial diarrhea risk.  Rectal discomfort Intermittent bleeding and discomfort due to possible hemorrhoids, worsened by constipation.  Rectral exam limited today. Suspect fissure. Persistent symptoms may warrant colonoscopy or flexible sigmoidoscopy. - Diltiazem/lidocaine 3 x daily for 2 months sent to compound pharmacy  - Prescribe suppositories twice daily for fourteen days. - Recommend squatty potty to aid bowel movements. - Discuss colonoscopy or flexible sigmoidoscopy if symptoms persist. Patient declines today - follow up with me in 6-8 weeks (message/call if worsening symptoms)   Assigned to Dr. Rhea Belton today  Boone Master, PA-C [facility] 12/17/2023, 11:48 AM  Cc: Doreene Eland, MD

## 2023-12-17 NOTE — Patient Instructions (Addendum)
 Your provider has prescribed Diltiazem gel for you. Please follow the directions written on your prescription bottle or given to you specifically by your provider. Since this is a specialty medication and is not readily available at most local pharmacies, we have sent your prescription to:  [facility] information is below: Address: [address], [place], [place] [address]  [phone]  *Please DO NOT go directly from our office to pick up this medication! Give the pharmacy 1 day to process the prescription as this is compounded and takes time to make.  Benefiber- take 1 tabelspoon daily  Miralax- take every over day if fiber supplement doesn't work  We have sent the following medications to your pharmacy for you to pick up at your convenience: Hydrocortisone suppositories   Due to recent changes in healthcare laws, you may see the results of your imaging and laboratory studies on MyChart before your provider has had a chance to review them.  We understand that in some cases there may be results that are confusing or concerning to you. Not all laboratory results come back in the same time frame and the provider may be waiting for multiple results in order to interpret others.  Please give [place] 48 hours in order for your provider to thoroughly review all the results before contacting the office for clarification of your results.   Thank you for trusting me with your gastrointestinal care!   Boone Master, PA

## 2023-12-22 ENCOUNTER — Other Ambulatory Visit: Payer: Self-pay

## 2023-12-22 DIAGNOSIS — D352 Benign neoplasm of pituitary gland: Secondary | ICD-10-CM

## 2023-12-22 DIAGNOSIS — Z86018 Personal history of other benign neoplasm: Secondary | ICD-10-CM

## 2023-12-22 MED ORDER — CABERGOLINE 0.5 MG PO TABS: 0.2500 mg | ORAL_TABLET | ORAL | 0 refills | Status: DC

## 2023-12-24 ENCOUNTER — Telehealth: Payer: Self-pay

## 2023-12-24 ENCOUNTER — Other Ambulatory Visit (HOSPITAL_COMMUNITY): Payer: Self-pay

## 2023-12-24 NOTE — Telephone Encounter (Signed)
 Pharmacy Patient Advocate Encounter   Received notification from CoverMyMeds that prior authorization for Anucort-HC 25MG  suppositories is required/requested.   Insurance verification completed.   The patient is insured through  [employer]  .   Per test claim: PA required; PA submitted to above mentioned insurance via CoverMyMeds Key/confirmation #/[id number] Status is pending

## 2023-12-30 NOTE — Telephone Encounter (Signed)
 Left message for patient to call back

## 2023-12-30 NOTE — Telephone Encounter (Signed)
 Pharmacy Patient Advocate Encounter  Received notification from  [employer]  that Prior Authorization for [facility] 25MG  suppositories has been DENIED.  Full denial letter will be uploaded to the media tab. See denial reason below.  The requested medication is not a covered benefit and is excluded from coverage in accordance with the terms and conditions of the members plan benefit  PA #/Case ID/Reference #: [id number]

## 2023-12-30 NOTE — Telephone Encounter (Signed)
 [facility]-  Please advise... Patient says she was unable to afford the suppositories without insurance coverage. Would you have her to get preparation H suppositories OTC? Or anusol cream applied to glycerin suppository?

## 2023-12-31 MED ORDER — HYDROCORTISONE (PERIANAL) 2.5 % EX CREA: 1.0000 | TOPICAL_CREAM | Freq: Every day | CUTANEOUS | 0 refills | Status: AC

## 2024-01-03 NOTE — Progress Notes (Signed)
 Addendum: Reviewed and agree with assessment and management plan. Asha Grumbine, Carie Caddy, MD

## 2024-01-11 ENCOUNTER — Other Ambulatory Visit: Payer: Self-pay

## 2024-01-26 ENCOUNTER — Other Ambulatory Visit: Payer: Managed Care, Other (non HMO)

## 2024-01-28 ENCOUNTER — Ambulatory Visit: Payer: Managed Care, Other (non HMO) | Admitting: "Endocrinology

## 2024-01-28 ENCOUNTER — Other Ambulatory Visit

## 2024-02-01 ENCOUNTER — Ambulatory Visit: Admitting: Gastroenterology

## 2024-02-02 NOTE — Progress Notes (Signed)
 Chief Complaint: Follow-up of rectal bleeding and discomfort Primary GI MD: Dr. Bridgett Camps  HPI: 38 year old female with medical history as listed below presents for follow-up of rectal bleeding, constipation.  Last seen March 2025 with chronic constipation exacerbated by inadequate hydration and inconsistent MiraLAX use.  Recommended to use Benefiber daily and MiraLAX as needed.  She also was experiencing intermittent rectal bleeding with discomfort and rectal exam suspicious for fissure.  She was given diltiazem/lidocaine  3 times daily and suppositories  Patient presents today and states all of her symptoms have resolved.  She used compound cream and suppositories for her rectal bleeding/discomfort and has had no further bleeding/discomfort  She states she is currently taking MiraLAX every 2 to 3 days with adequate control of her bowel movements  Past Medical History:  Diagnosis Date   Dyspnea 04/13/2020   Leg swelling 04/13/2020   Preterm delivery     Past Surgical History:  Procedure Laterality Date   NO PAST SURGERIES      Current Outpatient Medications  Medication Sig Dispense Refill   AMBULATORY NON FORMULARY MEDICATION Medication Name: Diltiazem 2%/Lidocaine  2%   Using your index finger apply a small amount of medication inside the anal opening and to the external anal area twice daily x 6 weeks. 30 g 1   cabergoline  (DOSTINEX ) 0.5 MG tablet Take 0.5 tablets (0.25 mg total) by mouth 2 (two) times a week. 10 tablet 0   cholecalciferol (VITAMIN D ) 1000 units tablet Take 1,000 Units by mouth daily.     hydrocortisone  (ANUSOL -HC) 2.5 % rectal cream Place 1 Application rectally at bedtime. 30 g 0   polyethylene glycol (MIRALAX / GLYCOLAX) 17 g packet Take 17 g by mouth daily.     Safety Seal Miscellaneous MISC AA Gel with minoxidil USP 10% clobetasol USP 0.05% - apply to affected areas daily every morning 30 g 2   No current facility-administered medications for this visit.     Allergies as of 02/03/2024   (No Known Allergies)    Family History  Problem Relation Age of Onset   Diabetes Mother    Lung disease Neg Hx     Social History   Socioeconomic History   Marital status: Married    Spouse name: Not on file   Number of children: 2   Years of education: Not on file   Highest education level: Not on file  Occupational History   Not on file  Tobacco Use   Smoking status: Never   Smokeless tobacco: Never  Vaping Use   Vaping status: Never Used  Substance and Sexual Activity   Alcohol use: No   Drug use: No   Sexual activity: Not on file  Other Topics Concern   Not on file  Social History Narrative   Pt lives with her son Pinkie Brigham) born in November 2009 and daughter born in 2014.  and father of baby [facility] Diarassoaba).  Works in business.  Degree in business.    Parents live in [place].  Moved to [place]  in 2005, travels back to [place], has also previously lived in [place].   Social Drivers of Corporate investment banker Strain: Not on file  Food Insecurity: Not on file  Transportation Needs: Not on file  Physical Activity: Not on file  Stress: Not on file  Social Connections: Not on file  Intimate Partner Violence: Not on file    Review of Systems:    Constitutional: No weight loss, fever, chills, weakness or fatigue  HEENT: Eyes: No change in vision               Ears, Nose, Throat:  No change in hearing or congestion Skin: No rash or itching Cardiovascular: No chest pain, chest pressure or palpitations   Respiratory: No SOB or cough Gastrointestinal: See HPI and otherwise negative Genitourinary: No dysuria or change in urinary frequency Neurological: No headache, dizziness or syncope Musculoskeletal: No new muscle or joint pain Hematologic: No bleeding or bruising Psychiatric: No history of depression or anxiety    Physical Exam:  Vital signs: BP 112/64   Pulse 75   Ht 5\' 7"  (1.702 m)   Wt 153 lb (69.4 kg)    BMI 23.96 kg/m   Constitutional: NAD, alert and cooperative Head:  Normocephalic and atraumatic. Eyes:   PEERL, EOMI. No icterus. Conjunctiva pink. Rectal:  Declines Msk:  Symmetrical without gross deformities. Without edema, no deformity or joint abnormality.  Neurologic:  Alert and  oriented x4;  grossly normal neurologically.  Skin:   Dry and intact without significant lesions or rashes. Psychiatric: Oriented to person, place and time. Demonstrates good judgement and reason without abnormal affect or behaviors.  RELEVANT LABS AND IMAGING: CBC    Component Value Date/Time   WBC 4.1 05/10/2018 0943   WBC 8.5 11/17/2017 2201   RBC 4.64 05/10/2018 0943   RBC 4.41 11/17/2017 2201   HGB 12.5 05/10/2018 0943   HCT 38.5 05/10/2018 0943   PLT 225 05/10/2018 0943   MCV 83 05/10/2018 0943   MCH 26.9 05/10/2018 0943   MCH 28.1 11/17/2017 2201   MCHC 32.5 05/10/2018 0943   MCHC 35.1 11/17/2017 2201   RDW 14.1 05/10/2018 0943   LYMPHSABS 1.3 05/10/2018 0943   MONOABS 1.0 11/17/2017 2201   EOSABS 0.1 05/10/2018 0943   BASOSABS 0.0 05/10/2018 0943    CMP     Component Value Date/Time   NA 139 03/12/2023 1140   NA 142 12/10/2021 0920   K 4.3 03/12/2023 1140   CL 105 03/12/2023 1140   CO2 26 03/12/2023 1140   GLUCOSE 84 03/12/2023 1140   GLUCOSE 77 07/15/2012 1128   BUN 15 03/12/2023 1140   BUN 10 12/10/2021 0920   CREATININE 0.81 03/12/2023 1140   CREATININE 0.61 01/04/2015 1016   CALCIUM 9.3 03/12/2023 1140   PROT 7.3 03/15/2020 1545   ALBUMIN 4.6 03/15/2020 1545   AST 15 03/15/2020 1545   ALT 14 03/15/2020 1545   ALKPHOS 82 03/15/2020 1545   BILITOT 0.2 03/15/2020 1545   GFRNONAA 113 03/15/2020 1545   GFRAA 130 03/15/2020 1545     Assessment/Plan:   Chronic constipation Recommended Benefiber and MiraLAX. Currently taking miralax every 2-3 days with resolution of constipation. Doing well. -- well controlled on miralax -- if persistent symptoms, can increase  miralax dose -- Follow-up as needed or if recurrence of symptoms  Rectal discomfort Intermittent bleeding and discomfort secondary to possible hemorrhoid worsened from constipation.  Suspected fissure.  Given diltiazem/lidocaine  and suppositories twice daily for 14 days.  Pain and bleeding have resolved - Resolved s/p compound cream and suppositories - If symptoms return please let us  know - If worsening rectal bleeding or weight loss consider colonoscopy - Continue to control constipation to prevent worsening fissure/hemorrhoids   [facility] 02/03/2024, 11:09 AM  Cc: Arn Lane, MD

## 2024-02-03 ENCOUNTER — Ambulatory Visit: Admitting: Gastroenterology

## 2024-02-03 ENCOUNTER — Encounter: Payer: Self-pay | Admitting: Gastroenterology

## 2024-02-03 VITALS — BP 112/64 | HR 75 | Ht 67.0 in | Wt 153.0 lb

## 2024-02-03 DIAGNOSIS — K5909 Other constipation: Secondary | ICD-10-CM | POA: Diagnosis not present

## 2024-02-03 DIAGNOSIS — K625 Hemorrhage of anus and rectum: Secondary | ICD-10-CM | POA: Diagnosis not present

## 2024-02-03 DIAGNOSIS — K649 Unspecified hemorrhoids: Secondary | ICD-10-CM

## 2024-02-03 DIAGNOSIS — K6289 Other specified diseases of anus and rectum: Secondary | ICD-10-CM

## 2024-02-03 DIAGNOSIS — K5900 Constipation, unspecified: Secondary | ICD-10-CM

## 2024-02-03 NOTE — Patient Instructions (Signed)
 Please follow up sooner if symptoms increase or worsen  Due to recent changes in healthcare laws, you may see the results of your imaging and laboratory studies on MyChart before your provider has had a chance to review them.  We understand that in some cases there may be results that are confusing or concerning to you. Not all laboratory results come back in the same time frame and the provider may be waiting for multiple results in order to interpret others.  Please give us  48 hours in order for your provider to thoroughly review all the results before contacting the office for clarification of your results.   _______________________________________________________  If your blood pressure at your visit was 140/90 or greater, please contact your primary care physician to follow up on this.  _______________________________________________________  If you are age 74 or older, your body mass index should be between 23-30. Your Body mass index is 23.96 kg/m. If this is out of the aforementioned range listed, please consider follow up with your Primary Care Provider.  If you are age 85 or younger, your body mass index should be between 19-25. Your Body mass index is 23.96 kg/m. If this is out of the aformentioned range listed, please consider follow up with your Primary Care Provider.   ________________________________________________________  The [facility] providers would like to encourage you to use MYCHART to communicate with providers for non-urgent requests or questions.  Due to long hold times on the telephone, sending your provider a message by [facility] may be a faster and more efficient way to get a response.  Please allow 48 business hours for a response.  Please remember that this is for non-urgent requests.  _______________________________________________________ Thank you for trusting me with your gastrointestinal care!   Suzanna Erp, PA

## 2024-02-16 ENCOUNTER — Other Ambulatory Visit: Payer: Self-pay

## 2024-02-16 DIAGNOSIS — Z86018 Personal history of other benign neoplasm: Secondary | ICD-10-CM

## 2024-02-16 DIAGNOSIS — D352 Benign neoplasm of pituitary gland: Secondary | ICD-10-CM

## 2024-02-16 MED ORDER — CABERGOLINE 0.5 MG PO TABS: 0.2500 mg | ORAL_TABLET | ORAL | 0 refills | Status: AC

## 2024-02-24 NOTE — Progress Notes (Signed)
 Addendum: Reviewed and agree with assessment and management plan. Asha Grumbine, Carie Caddy, MD

## 2024-03-02 ENCOUNTER — Ambulatory Visit (INDEPENDENT_AMBULATORY_CARE_PROVIDER_SITE_OTHER): Payer: Managed Care, Other (non HMO) | Admitting: Dermatology

## 2024-03-02 DIAGNOSIS — Z91199 Patient's noncompliance with other medical treatment and regimen due to unspecified reason: Secondary | ICD-10-CM

## 2024-03-02 NOTE — Progress Notes (Signed)
 Patient no-showed today's appointment; appointment was for Hair Loss Follow Up,.

## 2024-03-06 ENCOUNTER — Ambulatory Visit: Admitting: "Endocrinology

## 2024-04-29 ENCOUNTER — Encounter: Payer: Self-pay | Admitting: Family Medicine

## 2024-05-11 ENCOUNTER — Other Ambulatory Visit: Payer: Self-pay

## 2024-05-11 DIAGNOSIS — L6681 Central centrifugal cicatricial alopecia: Secondary | ICD-10-CM

## 2024-05-11 MED ORDER — SAFETY SEAL MISCELLANEOUS MISC: 2 refills | Status: DC

## 2024-05-12 ENCOUNTER — Other Ambulatory Visit

## 2024-05-19 ENCOUNTER — Ambulatory Visit: Admitting: "Endocrinology

## 2024-06-07 ENCOUNTER — Encounter: Payer: Self-pay | Admitting: Family Medicine

## 2024-06-09 ENCOUNTER — Ambulatory Visit: Admitting: Family Medicine

## 2024-06-09 ENCOUNTER — Encounter: Payer: Self-pay | Admitting: Family Medicine

## 2024-06-09 ENCOUNTER — Other Ambulatory Visit (HOSPITAL_COMMUNITY)
Admission: RE | Admit: 2024-06-09 | Discharge: 2024-06-09 | Disposition: A | Source: Ambulatory Visit | Attending: Family Medicine | Admitting: Family Medicine

## 2024-06-09 VITALS — BP 115/81 | HR 91 | Ht 67.0 in | Wt 156.2 lb

## 2024-06-09 DIAGNOSIS — Z124 Encounter for screening for malignant neoplasm of cervix: Secondary | ICD-10-CM

## 2024-06-09 DIAGNOSIS — Z0000 Encounter for general adult medical examination without abnormal findings: Secondary | ICD-10-CM | POA: Diagnosis not present

## 2024-06-09 DIAGNOSIS — Z23 Encounter for immunization: Secondary | ICD-10-CM | POA: Diagnosis not present

## 2024-06-09 DIAGNOSIS — E221 Hyperprolactinemia: Secondary | ICD-10-CM | POA: Diagnosis not present

## 2024-06-09 DIAGNOSIS — R7989 Other specified abnormal findings of blood chemistry: Secondary | ICD-10-CM

## 2024-06-09 DIAGNOSIS — E785 Hyperlipidemia, unspecified: Secondary | ICD-10-CM

## 2024-06-09 DIAGNOSIS — F430 Acute stress reaction: Secondary | ICD-10-CM

## 2024-06-09 NOTE — Progress Notes (Signed)
 SUBJECTIVE:   Chief compliant/HPI: annual examination  Sarah Prince is a 38 y.o. who presents today for an annual exam.   Review of systems form notable for :  Depressed mood: Feeling down, too much going on in her head, and she feels like a therapist might help. 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 Her recent endocrinologist is Komal Motwani.  Updated history tabs and problem list PMHx reviewed.   OBJECTIVE:   BP 115/81   Pulse 91   Ht 5' 7 (1.702 m)   Wt 156 lb 3.2 oz (70.9 kg)   LMP 06/07/2024   SpO2 97%   BMI 24.46 kg/m   Physical Exam Vitals and nursing note reviewed. Exam conducted with a chaperone present Lossie Meissner).  HENT:     Head: Atraumatic.     Right Ear: Tympanic membrane and ear canal normal. There is no impacted cerumen.     Left Ear: Tympanic membrane and ear canal normal. There is no impacted cerumen.     Mouth/Throat:     Mouth: Mucous membranes are moist.     Pharynx: No oropharyngeal exudate or posterior oropharyngeal erythema.  Eyes:     Conjunctiva/sclera: Conjunctivae normal.     Pupils: Pupils are equal, round, and reactive to light.  Cardiovascular:     Rate and Rhythm: Normal rate and regular rhythm.     Heart sounds: Normal heart sounds. No murmur heard. Pulmonary:     Effort: Pulmonary effort is normal. No respiratory distress.     Breath sounds: Normal breath sounds. No wheezing.  Abdominal:     General: Abdomen is flat. Bowel sounds are normal. There is no distension.     Palpations: Abdomen is soft.     Tenderness: There is no abdominal tenderness.  Genitourinary:    Comments: Blood in vaginal vault- having her period. Otherwise, normal exam Musculoskeletal:     Cervical back: Neck supple.  Lymphadenopathy:     Cervical: No cervical adenopathy.  Neurological:     Mental Status: She is alert and oriented to person, place, and time.     Cranial Nerves: No cranial nerve deficit.      Sensory: No sensory deficit.     Motor: No weakness.  Psychiatric:        Mood and Affect: Mood normal.        Behavior: Behavior normal.      ASSESSMENT/PLAN:   Assessment & Plan Well adult exam PAP completed Immunization updated Cervical cancer screening PAP completed today Hyperlipidemia, unspecified hyperlipidemia type Lab obtained Currently diet controlled Hyperprolactinemia (HCC) She requested Prolactin test and referral to endocrinology Both requests completed She is aware that the result of her prolactin level will be managed by her endocrinologist and not by me Low vitamin D  level Level checked Continue OTC supplementation  Acute stress reaction Psychology referral order placed Annual Examination  See AVS for age appropriate recommendations.   PHQ score 12, reviewed and discussed. No SI Blood pressure reviewed and at goal : Yes.  Asked about intimate partner violence and patient reports None.  The patient currently uses nothing for contraception. She was trying to get pregnant, but given up now.  Folate recommended as appropriate, minimum of 400 mcg per day: No  Advanced directives discussed and form provided to review and complete   Considered the following items based upon USPSTF recommendations: HIV testing: discussed and completed in 2016 and was negative Hepatitis C: discussed  Hepatitis B: discussed Syphilis if at high risk: low risk GC/CT low risk Lipid panel (nonfasting or fasting) discussed based upon AHA recommendations and ordered.  Consider repeat every 4-6 years.  Reviewed risk factors for latent tuberculosis and not indicated  Discussed family history, BRCA testing not indicated. Tool used to risk stratify was Pedigree Assessment tool N/A - denies FHX of cancer  Cervical cancer screening: Completed today Immunizations Declined Covid19,  flu shot and Tdap  Received HPV MyChart Activation:Already signed up   Follow up in 1  year or sooner  if indicated.    Otto Fairly, MD [facility]

## 2024-06-09 NOTE — Patient Instructions (Signed)
 Managing Stress, Adult Feeling a certain amount of stress is normal. Stress helps our body and mind get ready to deal with the demands of life. Stress hormones can motivate you to do well at work and meet your responsibilities. But severe or long-term (chronic) stress can affect your mental and physical health. Chronic stress puts you at higher risk for: Anxiety and depression. Other health problems such as digestive problems, muscle aches, heart disease, high blood pressure, and stroke. What are the causes? Common causes of stress include: Demands from work, such as deadlines, feeling overworked, or having long hours. Pressures at home, such as money issues, disagreements with a spouse, or parenting issues. Pressures from major life changes, such as divorce, moving, loss of a loved one, or chronic illness. You may be at higher risk for stress-related problems if you: Do not get enough sleep. Are in poor health. Do not have emotional support. Have a mental health disorder such as anxiety or depression. How to recognize stress Stress can make you: Have trouble sleeping. Feel sad, anxious, irritable, or overwhelmed. Lose your appetite. Overeat or want to eat unhealthy foods. Want to use drugs or alcohol. Stress can also cause physical symptoms, such as: Sore, tense muscles, especially in the shoulders and neck. Headaches. Trouble breathing. A faster heart rate. Stomach pain, nausea, or vomiting. Diarrhea or constipation. Trouble concentrating. Follow these instructions at home: Eating and drinking Eat a healthy diet. This includes: Eating foods that are high in fiber, such as beans, whole grains, and fresh fruits and vegetables. Limiting foods that are high in fat and processed sugars, such as fried or sweet foods. Do not skip meals or overeat. Drink enough fluid to keep your urine pale yellow. Alcohol use Do not drink alcohol if: Your health care provider tells you not to  drink. You are pregnant, may be pregnant, or are planning to become pregnant. Drinking alcohol is a way some people try to ease their stress. This can be dangerous, so if you drink alcohol: Limit how much you have to: 0-1 drink a day for women. 0-2 drinks a day for men. Know how much alcohol is in your drink. In the [place] one drink equals one 12 oz bottle of beer (355 mL), one 5 oz glass of wine (148 mL), or one 1 oz glass of hard liquor (44 mL). Activity  Include 30 minutes of exercise in your daily schedule. Exercise is a good stress reducer. Include time in your day for an activity that you find relaxing. Try taking a walk, going on a bike ride, reading a book, or listening to music. Schedule your time in a way that lowers stress, and keep a regular schedule. Focus on doing what is most important to get done. Lifestyle Identify the source of your stress and your reaction to it. See a therapist who can help you change unhelpful reactions. When there are stressful events: Talk about them with family, friends, or coworkers. Try to think realistically about stressful events and not ignore them or overreact. Try to find the positives in a stressful situation and not focus on the negatives. Cut back on responsibilities at work and home, if possible. Ask for help from friends or family members if you need it. Find ways to manage stress, such as: Mindfulness, meditation, or deep breathing. Yoga or tai chi. Progressive muscle relaxation. Spending time in nature. Doing art, playing music, or reading. Making time for fun activities. Spending time with family and friends. Get support  from family, friends, or spiritual resources. General instructions Get enough sleep. Try to go to sleep and get up at about the same time every day. Take over-the-counter and prescription medicines only as told by your health care provider. Do not use any products that contain nicotine or tobacco. These products  include cigarettes, chewing tobacco, and vaping devices, such as e-cigarettes. If you need help quitting, ask your health care provider. Do not use drugs or smoke to deal with stress. Keep all follow-up visits. This is important. Where to find support Talk with your health care provider about stress management or finding a support group. Find a therapist to work with you on your stress management techniques. Where to find more information [employer] on Mental Illness: www.nami.org American Psychological Association: DiceTournament.ca Contact a health care provider if: Your stress symptoms get worse. You are unable to manage your stress at home. You are struggling to stop using drugs or alcohol. Get help right away if: You may be a danger to yourself or others. You have any thoughts of death or suicide. Get help right awayif you feel like you may hurt yourself or others, or have thoughts about taking your own life. Go to your nearest emergency room or: Call 911. Call the National Suicide Prevention Lifeline at (304)678-3299 or 988 in the [place] This is open 24 hours a day. If you're a Veteran: Call 988 and press 1. This is open 24 hours a day. Text the [employer] at 226-132-3352. Summary Feeling a certain amount of stress is normal, but severe or long-term (chronic) stress can affect your mental and physical health. Chronic stress can put you at higher risk for anxiety, depression, and other health problems such as digestive problems, muscle aches, heart disease, high blood pressure, and stroke. You may be at higher risk for stress-related problems if you do not get enough sleep, are in poor health, lack emotional support, or have a mental health disorder such as anxiety or depression. Identify the source of your stress and your reaction to it. Try talking about stressful events with family, friends, or coworkers, finding a coping method, or getting support from spiritual resources. If  you need more help, talk with your health care provider about finding a support group or a mental health therapist. This information is not intended to replace advice given to you by your health care provider. Make sure you discuss any questions you have with your health care provider. Document Revised: 05/06/2023 Document Reviewed: 04/15/2021 Elsevier Patient Education  2024 [employer].

## 2024-06-09 NOTE — Assessment & Plan Note (Signed)
 Lab obtained Currently diet controlled

## 2024-06-10 ENCOUNTER — Ambulatory Visit: Payer: Self-pay | Admitting: Family Medicine

## 2024-06-10 LAB — VITAMIN D 25 HYDROXY (VIT D DEFICIENCY, FRACTURES): Vit D, 25-Hydroxy: 18 ng/mL — ABNORMAL LOW (ref 30.0–100.0)

## 2024-06-10 LAB — PROLACTIN: Prolactin: 62.9 ng/mL — ABNORMAL HIGH (ref 4.8–33.4)

## 2024-06-10 LAB — LIPID PANEL
Chol/HDL Ratio: 3.8 ratio (ref 0.0–4.4)
Cholesterol, Total: 224 mg/dL — ABNORMAL HIGH (ref 100–199)
HDL: 59 mg/dL (ref >39)
LDL Chol Calc (NIH): 155 mg/dL — ABNORMAL HIGH (ref 0–99)
Triglycerides: 58 mg/dL (ref 0–149)
VLDL Cholesterol Cal: 10 mg/dL (ref 5–40)

## 2024-06-10 LAB — TSH RFX ON ABNORMAL TO FREE T4: TSH: 1.16 u[IU]/mL (ref 0.450–4.500)

## 2024-06-10 MED ORDER — VITAMIN D (ERGOCALCIFEROL) 1.25 MG (50000 UNIT) PO CAPS
50000.0000 [IU] | ORAL_CAPSULE | ORAL | 1 refills | Status: DC
Start: 2024-06-10 — End: 2024-07-31

## 2024-06-10 NOTE — Addendum Note (Signed)
 Addended by: ANDERS CUMINS T on: 06/10/2024 09:16 AM   Modules accepted: Orders

## 2024-06-19 LAB — CYTOLOGY - PAP
Comment: NEGATIVE
Diagnosis: NEGATIVE
High risk HPV: NEGATIVE

## 2024-06-23 ENCOUNTER — Encounter: Payer: Self-pay | Admitting: Family Medicine

## 2024-06-23 ENCOUNTER — Ambulatory Visit (INDEPENDENT_AMBULATORY_CARE_PROVIDER_SITE_OTHER): Admitting: Family Medicine

## 2024-06-23 VITALS — BP 114/77 | HR 72

## 2024-06-23 DIAGNOSIS — K224 Dyskinesia of esophagus: Secondary | ICD-10-CM | POA: Diagnosis not present

## 2024-06-23 DIAGNOSIS — R0789 Other chest pain: Secondary | ICD-10-CM

## 2024-06-23 MED ORDER — FAMOTIDINE 20 MG PO TABS: 20.0000 mg | ORAL_TABLET | Freq: Two times a day (BID) | ORAL | 1 refills | Status: DC

## 2024-06-23 NOTE — Patient Instructions (Signed)
 Esophageal Dysmotility (Esophageal Motility Disorders) Esophageal dysmotility (esophageal motility disorders) is when your esophagus doesn't move food and liquid to your stomach like it should. Symptoms include chest pain, heartburn and trouble swallowing food and fluid. Several conditions cause esophageal dysmotility. Treatment may include medication or procedures to ease symptoms.  What is esophageal dysmotility? Esophageal dysmotility (esophageal motility disorders) refers to certain esophageal disorders that cause food and liquid to get stuck in your esophagus (food pipe) so they don't move to your stomach.  Your esophagus is a tube in the center of your neck and chest. It carries food and liquid from your mouth and throat to your stomach. Normally, muscles in your esophagus work together to keep things moving along:  First, a muscle at the top of your esophagus (upper esophageal sphincter) opens to let in food and fluid coming from your throat. Next, your esophagus muscles flex and relax (contract) as they move that food and liquid through it. Finally, your lower esophageal sphincter at the bottom of your esophagus opens to let food and liquid into your stomach.  Symptoms and Causes What are the symptoms of esophageal dysmotility?  Common symptoms are:  Chest pain that happens after you eat. Difficulty swallowing (dysphagia). Heartburn. What causes esophageal dysmotility? Several disorders are forms of esophageal dysmotility. Some only cause issues in your esophagus. A healthcare provider may refer to these as primary disorders. And there are certain disorders that affect your whole body, including your esophagus. A provider may call these secondary disorders.  The most common primary disorders are:  Achalasia. In achalasia, your lower esophageal sphincter doesn't relax, trapping food and liquid in your esophagus. Eosinophilic esophagitis. This condition causes your esophagus to narrow  so much that food and liquid can't get through to your stomach. Esophageal spasm. Unusually strong or uncoordinated muscle contractions make food back up in your esophagus. Common secondary disorders that lead to esophageal dysmotility include:  Chagas disease. A bite from a triatomine (kissing) bug may cause a chronic infection that spreads to your esophagus. The infection makes it hard for you to swallow. Esophageal sclerosis. Sclerosis is an autoimmune disorder. It makes your body produce unusually thick tissue. Thick tissue in your esophagus makes it hard for food to move through your esophagus. A cancerous tumor in your esophagus is another condition that can cause esophageal dysmotility.  What are the complications of esophageal dysmotility? Aspiration pneumonia and other lung infections are common potential complications that can develop if the food in your esophagus backs up into your trachea (windpipe) and then into your lungs.  Diagnosis and Tests How do healthcare providers diagnose esophageal dysmotility? A healthcare provider will ask about your symptoms. They'll do a physical examination. They may refer you to a gastroenterologist for more tests.  Gastroenterologists are healthcare providers who specialize in diagnosing and treating conditions that affect your digestive tract, including your esophagus. Your gastroenterologist may do tests to see how well food and liquid move through your esophagus. Tests may include:  Barium swallow (esophagram) test. This is a fluoroscopic X-ray It makes videos of your esophagus in action. EndoFLIP. This test measures the inside of your esophagus. It also measures esophageal muscle activity. Esophageal manometry test. Like EndoFLIP, this is another check on esophageal muscle activity. Upper endoscopy. This test checks your upper digestive tract, including your esophagus. It uses an endoscope, a narrow tube with a light and a camera. Management and  Treatment How is esophageal dysmotility treated? Your treatment will depend on the  disorder that causes esophageal dysmotility. If you have a primary disorder, treatment may be a combination of:  Medications to relax your esophageal muscles or reduce inflammation Nonsurgical procedures like esophageal dilation that stretch your esophagus to make it easier for you to swallow Minimally invasive surgeries like Heller myotomy or peroral endoscopic myotomy to loosen your esophageal muscles

## 2024-06-23 NOTE — Progress Notes (Signed)
    SUBJECTIVE:   CHIEF COMPLAINT / HPI:   Discussed the use of AI scribe software for clinical note transcription with the patient, who gave verbal consent to proceed.  History of Present Illness   Sarah Prince is a 38 year old female who presents with chest spasms and contractions.  She has been experiencing spasms and contractions in her chest, primarily on the right side, since Wednesday morning. The sensation is described as a contraction that comes and goes, sometimes triggered by swallowing or certain positions. The pain is rated as a 5 out of 10, requiring her to stop activities when it occurs.  The symptoms began after resuming gym activities, specifically after attending a Zumba class on Tuesday. Initially, she attributed the discomfort to muscle soreness from exercise, as she had not engaged in sports for a long time. However, the symptoms persisted beyond typical muscle soreness.  In 2019, she experienced similar chest spasms and contractions, which led to an emergency department visit. At that time, she was diagnosed with costochondritis, and a chest x-ray showed mild cardiomegaly. An echocardiogram in 2021 was normal with an ejection fraction of 60-65%.  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 However, she notes that the current symptoms feel different, as they are more like contractions in the chest rather than breathing difficulties.  She has been intentionally drinking and eating to monitor the symptoms, which seem to align with her findings online.       PERTINENT  PMH / PSH: PMHx reviewed  OBJECTIVE:   BP 114/77   Pulse 72   LMP 06/07/2024   SpO2 100%   Physical Exam   VITALS: P- 72, BP- 114/77, SaO2- 100% CHEST: Lungs clear to auscultation, no wheezing, no crackles. No chest wall tenderness. CARDIOVASCULAR: No murmurs. ABDOMEN: No epigastric, right  upper quadrant, or left upper quadrant tenderness.     Results   RADIOLOGY Chest x-ray: Mild cardiomegaly (11/2017)  DIAGNOSTIC Echocardiogram: Ejection fraction 60-65%, normal (2021)       ASSESSMENT/PLAN:   Assessment & Plan    Assessment and Plan   Atypical chest pain - likely esophageal dysmotility Esophageal dysmotility Intermittent chest spasms and contractions suggest esophageal involvement. Symptoms align with esophageal dysmotility rather than cardiac origin. Echocardiogram in 2021 was normal. - Trial H2 blocker and monitor symptoms. - Prescribe famotidine  20 mg daily. - Advise small, regular meals. - Encourage hydration. - Provided educational material on esophageal dysmotility. - Refer to gastroenterology if no improvement in two weeks.        Otto Fairly, MD [facility]

## 2024-07-19 ENCOUNTER — Encounter: Payer: Self-pay | Admitting: Family Medicine

## 2024-07-20 ENCOUNTER — Other Ambulatory Visit: Payer: Self-pay | Admitting: Family Medicine

## 2024-07-20 ENCOUNTER — Other Ambulatory Visit

## 2024-07-20 DIAGNOSIS — E785 Hyperlipidemia, unspecified: Secondary | ICD-10-CM

## 2024-07-21 ENCOUNTER — Ambulatory Visit: Payer: Self-pay | Admitting: Family Medicine

## 2024-07-21 LAB — BASIC METABOLIC PANEL WITH GFR
BUN/Creatinine Ratio: 13 (ref 9–23)
BUN: 9 mg/dL (ref 6–20)
CO2: 25 mmol/L (ref 20–29)
Calcium: 9.6 mg/dL (ref 8.7–10.2)
Chloride: 100 mmol/L (ref 96–106)
Creatinine, Ser: 0.68 mg/dL (ref 0.57–1.00)
Glucose: 70 mg/dL (ref 70–99)
Potassium: 4.6 mmol/L (ref 3.5–5.2)
Sodium: 138 mmol/L (ref 134–144)
eGFR: 116 mL/min/1.73 (ref >59)

## 2024-07-30 ENCOUNTER — Other Ambulatory Visit: Payer: Self-pay | Admitting: Family Medicine

## 2024-09-07 ENCOUNTER — Encounter: Payer: Self-pay | Admitting: Family Medicine

## 2024-09-08 ENCOUNTER — Ambulatory Visit: Admitting: Family Medicine

## 2024-09-08 ENCOUNTER — Encounter: Payer: Self-pay | Admitting: Family Medicine

## 2024-09-08 DIAGNOSIS — N938 Other specified abnormal uterine and vaginal bleeding: Secondary | ICD-10-CM | POA: Insufficient documentation

## 2024-09-08 DIAGNOSIS — N946 Dysmenorrhea, unspecified: Secondary | ICD-10-CM | POA: Insufficient documentation

## 2024-09-08 NOTE — Progress Notes (Addendum)
 [facility] Telemedicine Visit  Patient consented to have virtual visit and was identified by name and date of birth. Method of visit: Telephone  Encounter participants: Patient: Raniyah Curenton - located at Home Provider: Otto Fairly - located at [facility] office Others (if applicable): N/A  Chief Complaint: back pain and heavy menstrual bleedin  HPI:  Discussed the use of AI scribe software for clinical note transcription with the patient, who gave verbal consent to proceed.  History of Present Illness   Llewellyn Schoenberger is a 38 year old female who presents with severe back pain and heavy menstrual bleeding.  She has been experiencing severe back pain that began on Tuesday night i.e. 3 days ago, described as unlike any she had felt before. The pain starts in her back and radiates to her stomach area, rated as 10 out of 10, and is so intense that she cannot sit or walk straight. The pain persisted from Tuesday afternoon until Wednesday afternoon.  On Wednesday, she began experiencing a heavy and dark menstrual period with a lot of flow and blood clots, requiring her to change pads approximately five times per day. By Thursday and today, the bleeding became less dark and more red, with a slight decrease in flow compared to the previous days. She experienced belly pain on Wednesday, which she managed with over-the-counter ibuprofen  200 mg and Tylenol , both providing some relief. She is unsure if the back pain is related to her menstrual cycle, as she has never experienced back pain in conjunction with her period before.  She is not currently on any birth control and has a history of trying to conceive, although it is not a current priority. She is not taking cabergoline , which was previously prescribed for hyperprolactinemia, and has not had recent follow-up with neurology or endocrinology.       ROS: per HPI  Pertinent PMHx: As in the body of hx  Results   LABS Prolactin:  62.9 ng/mL (06/2024)  RADIOLOGY Pelvic ultrasound: Endometrial thickness 14 mm, normal ovaries (02/2023)      Exam:  LMP 09/06/2024   Respiratory: She could complete full sentences while talking on the phone  Assessment/Plan:  DUB (dysfunctional uterine bleeding) Heavy menstrual bleeding with dysmenorrhea  Previous ultrasound showed thickened endometrium. Differential includes dysmenorrhea, endometriosis, and unlikely ectopic pregnancy. Discussed endometriosis and further imaging if needed. - Ordered pelvic ultrasound to reassess endometrial thickness. - Scheduled appointment with me for lab work to assess hormonal levels. - Advised home urine pregnancy test to rule out ectopic pregnancy although less likely. ED precaution if positive pregnancy test - Continue ibuprofen  and acetaminophen  for pain management.  Dysmenorrhea Heavy menstrual bleeding with dysmenorrhea  - Advised home urine pregnancy test to rule out ectopic pregnancy although less likely. ED precaution if positive pregnancy test - Continue ibuprofen  and acetaminophen  for pain management. - Consider x-ray of lower back if pain persists post-menstruation.    Time spent during visit with patient: 15 minutes

## 2024-09-08 NOTE — Progress Notes (Signed)
 Patient is scheduled for Pelvic US  on 12/12 @2pm . Patient was notified.

## 2024-09-08 NOTE — Assessment & Plan Note (Signed)
 Heavy menstrual bleeding with dysmenorrhea  Previous ultrasound showed thickened endometrium. Differential includes dysmenorrhea, endometriosis, and unlikely ectopic pregnancy. Discussed endometriosis and further imaging if needed. - Ordered pelvic ultrasound to reassess endometrial thickness. - Scheduled appointment with me for lab work to assess hormonal levels. - Advised home urine pregnancy test to rule out ectopic pregnancy although less likely. ED precaution if positive pregnancy test - Continue ibuprofen  and acetaminophen  for pain management.

## 2024-09-08 NOTE — Assessment & Plan Note (Signed)
 Heavy menstrual bleeding with dysmenorrhea  - Advised home urine pregnancy test to rule out ectopic pregnancy although less likely. ED precaution if positive pregnancy test - Continue ibuprofen  and acetaminophen  for pain management. - Consider x-ray of lower back if pain persists post-menstruation.

## 2024-09-08 NOTE — Patient Instructions (Signed)
 Abnormal Uterine Bleeding    Abnormal uterine bleeding means bleeding more than normal from your womb (uterus). It can include:  Bleeding after sex.  Bleeding between monthly (menstrual) periods.  Bleeding that is heavier than normal.  Monthly periods that last longer than normal.  Bleeding after you have stopped having your monthly period (menopause).  You should see a doctor for any kind of bleeding that is not normal. Treatment depends on the cause of your bleeding and how much you bleed.  Follow these instructions at home:  Medicines  Take over-the-counter and prescription medicines only as told by your doctor.  Ask your doctor about:  Taking medicines such as aspirin and ibuprofen. Do not take these medicines unless your doctor tells you to take them.  Taking over-the-counter medicines, vitamins, herbs, and supplements.  You may be given iron pills. Take them as told by your doctor.  Managing constipation  If you take iron pills, you may need to take these actions to prevent or treat trouble pooping (constipation):  Drink enough fluid to keep your pee (urine) pale yellow.  Take over-the-counter or prescription medicines.  Eat foods that are high in fiber. These include beans, whole grains, and fresh fruits and vegetables.  Limit foods that are high in fat and sugar. These include fried or sweet foods.  Activity  Change your activity to decrease bleeding if you need to change your sanitary pad more than one time every 2 hours:  Lie in bed with your feet raised (elevated).  Place a cold pack on your lower belly.  Rest as much as you are able until the bleeding stops or slows down.  General instructions  Do not use tampons, douche, or have sex until your doctor says these things are okay.  Change your pads often.  Get regular exams. These include:  Pelvic exams.  Screenings for cancer of the cervix.  It is up to you to get the results of any tests that are done. Ask how to get your results when they are  ready.  Watch for any changes in your bleeding. For 2 months, write down:  When your monthly period starts.  When your monthly period ends.  When you get any abnormal bleeding from your vagina.  What problems you notice.  Keep all follow-up visits.  Contact a doctor if:  The bleeding lasts more than one week.  You feel dizzy at times.  You feel like you may vomit (nausea).  You vomit.  You feel light-headed or weak.  Your symptoms get worse.  Get help right away if:  You faint.  You have to change pads every hour.  You have pain in your belly.  You have a fever or chills.  You get sweaty or weak.  You pass large blood clots from your vagina.  These symptoms may be an emergency. Get help right away. Call your local emergency services (911 in the [place]  Do not wait to see if the symptoms will go away.  Do not drive yourself to the hospital.  Summary  Abnormal uterine bleeding means bleeding more than normal from your womb (uterus).  Any kind of bleeding that is not normal should be checked by a doctor.  Treatment depends on the cause of your bleeding and how much you bleed.  Get help right away if you faint, you have to change pads every hour, or you pass large blood clots from your vagina.  This information is not intended to replace  advice given to you by your health care provider. Make sure you discuss any questions you have with your health care provider.  Document Revised: 01/21/2021 Document Reviewed: 01/21/2021  Elsevier Patient Education  2024 [employer].

## 2024-09-15 ENCOUNTER — Ambulatory Visit (HOSPITAL_BASED_OUTPATIENT_CLINIC_OR_DEPARTMENT_OTHER): Admission: RE | Admit: 2024-09-15 | Discharge: 2024-09-15 | Attending: Family Medicine | Admitting: Family Medicine

## 2024-09-15 ENCOUNTER — Encounter: Payer: Self-pay | Admitting: Family Medicine

## 2024-09-15 ENCOUNTER — Ambulatory Visit: Admitting: Family Medicine

## 2024-09-15 VITALS — BP 118/84 | HR 80 | Ht 67.0 in | Wt 155.6 lb

## 2024-09-15 DIAGNOSIS — D219 Benign neoplasm of connective and other soft tissue, unspecified: Secondary | ICD-10-CM

## 2024-09-15 DIAGNOSIS — N938 Other specified abnormal uterine and vaginal bleeding: Secondary | ICD-10-CM

## 2024-09-15 DIAGNOSIS — R4589 Other symptoms and signs involving emotional state: Secondary | ICD-10-CM

## 2024-09-15 DIAGNOSIS — D352 Benign neoplasm of pituitary gland: Secondary | ICD-10-CM

## 2024-09-15 DIAGNOSIS — R109 Unspecified abdominal pain: Secondary | ICD-10-CM

## 2024-09-15 DIAGNOSIS — N946 Dysmenorrhea, unspecified: Secondary | ICD-10-CM

## 2024-09-15 DIAGNOSIS — E559 Vitamin D deficiency, unspecified: Secondary | ICD-10-CM

## 2024-09-15 LAB — POCT URINE PREGNANCY: Preg Test, Ur: NEGATIVE

## 2024-09-15 NOTE — Assessment & Plan Note (Addendum)
Lab checked.

## 2024-09-15 NOTE — Assessment & Plan Note (Addendum)
 Prolactin checked I advised her to please follow up with endocrinology She agreed with the plan.
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
## 2024-09-15 NOTE — Assessment & Plan Note (Addendum)
 High depression score of 20 with significant symptoms. No suicidal ideation. Counseling recommended, medication initiation discussed due to severity. However, she declined - Provided list of psychiatrists. - Advised call insurance for psychiatrist contact for coverage. - F/U soon if symptoms worsens

## 2024-09-15 NOTE — Patient Instructions (Signed)
   [facility] Diagnosis and Treatment of Childhood Mood Disorders, ADHD, Autism, and Developmental Delay  [address], [address] [place],  [place]  [address] Get Driving Directions Main: [phone]  Assessments for ADHD and Therapy for Children  [facility]: [phone] [facility] [address], [place], [place] [address]  [phone]  [phone]  [facility] Health Disorders  This also provides regular therapy at low cost for children Therapists speak Spanish and English  [address], [place], [place] [address] Monday - Friday: 8:30am-12:00pm / 1:00pm-2:30pm  

## 2024-09-15 NOTE — Assessment & Plan Note (Addendum)
 Abnormal uterine bleeding Recent heavy bleeding and spotting. Differential includes fibroids, cysts, endometriosis. Awaiting ultrasound to rule out structural causes. - Scheduled pelvic ultrasound. - Ordered hormone tests. - Referral to gynecologist if indicated by ultrasound.

## 2024-09-16 LAB — TSH RFX ON ABNORMAL TO FREE T4: TSH: 0.724 u[IU]/mL (ref 0.450–4.500)

## 2024-09-18 ENCOUNTER — Ambulatory Visit: Payer: Self-pay | Admitting: Family Medicine

## 2024-09-18 ENCOUNTER — Encounter: Payer: Self-pay | Admitting: Family Medicine

## 2024-09-18 DIAGNOSIS — G930 Cerebral cysts: Secondary | ICD-10-CM | POA: Insufficient documentation

## 2024-09-18 NOTE — Addendum Note (Signed)
 Addended by: ANDERS CUMINS T on: 09/18/2024 08:02 AM   Modules accepted: Level of Service

## 2024-09-18 NOTE — Addendum Note (Signed)
 Addended by: ANDERS CUMINS T on: 09/18/2024 07:56 AM   Modules accepted: Level of Service

## 2024-09-20 LAB — ALB+PRL+FSH+LH+PROG+DHEA+ES...
Albumin: 4 g/dL (ref 3.9–4.9)
Dehydroepiandrosterone: 186 ng/dL (ref 31–701)
Estrogen: 166 pg/mL
FSH: 10.5 m[IU]/mL
LH: 9 m[IU]/mL
Progesterone: <0.1 ng/mL
Prolactin: 64.5 ng/mL — ABNORMAL HIGH (ref 4.8–33.4)
Sex Hormone Binding: 43.7 nmol/L (ref 24.6–122.0)
Vit D, 25-Hydroxy: 43.1 ng/mL (ref 30.0–100.0)

## 2024-09-26 ENCOUNTER — Ambulatory Visit: Payer: Self-pay | Admitting: Family Medicine

## 2024-09-26 NOTE — Addendum Note (Signed)
 Addended by: ANDERS OTTO DASEN on: 09/26/2024 09:38 AM   Modules accepted: Orders

## 2024-10-19 ENCOUNTER — Other Ambulatory Visit: Payer: Self-pay

## 2024-10-19 ENCOUNTER — Encounter: Payer: Self-pay | Admitting: Dermatology

## 2024-10-19 ENCOUNTER — Ambulatory Visit: Payer: Self-pay | Admitting: Dermatology

## 2024-10-19 ENCOUNTER — Other Ambulatory Visit: Payer: Self-pay | Admitting: Dermatology

## 2024-10-19 DIAGNOSIS — L821 Other seborrheic keratosis: Secondary | ICD-10-CM

## 2024-10-19 DIAGNOSIS — L6681 Central centrifugal cicatricial alopecia: Secondary | ICD-10-CM | POA: Diagnosis not present

## 2024-10-19 DIAGNOSIS — L700 Acne vulgaris: Secondary | ICD-10-CM

## 2024-10-19 DIAGNOSIS — L649 Androgenic alopecia, unspecified: Secondary | ICD-10-CM

## 2024-10-19 MED ORDER — SAFETY SEAL MISCELLANEOUS MISC: 1.0000 | Freq: Every day | 6 refills | Status: AC

## 2024-10-19 MED ORDER — TRETINOIN 0.025 % EX CREA: TOPICAL_CREAM | Freq: Every day | CUTANEOUS | 2 refills | Status: DC

## 2024-10-19 NOTE — Patient Instructions (Addendum)
 " VISIT SUMMARY:  During your visit, we discussed your ongoing hair loss and new skin concerns. We reviewed your current treatments and made some adjustments to better address your symptoms.  YOUR PLAN:  -ANDROGENETIC ALOPECIA:  This is a common form of hair thinning in women, often due to hormonal changes. We will continue with your current treatments of minoxidil and clobetasol, but we have added an ingredient to block DHT receptors to prevent further thinning. We also switched to a liquid solution for easier application. Please apply the drops to both the front and back areas of your scalp.  -DERMATOSIS PAPULOSA NIGRA:  This condition causes small dark spots on the skin, often due to genetics and aging.   We recommend using Roc retinol cream on your neck and face, and we have prescribed tretinoin  0.025% cream for your back, to be mixed with lotion and applied every other night.   We also discussed cosmetic removal options, which may result in flat hyperpigmented spots that can be treated with lightening cream.  INSTRUCTIONS:  Please follow up with us  in October to assess the effectiveness of the new treatments and make any necessary adjustments.  Other Procedure(Skin Tags): Quote for cosmetic removal:  $200 to remove Up to 15 lesions $300 to remove  16 to 25 $400 to removal  26-35 $10 per tag for each additional          Important Information  Due to recent changes in healthcare laws, you may see results of your pathology and/or laboratory studies on MyChart before the doctors have had a chance to review them. We understand that in some cases there may be results that are confusing or concerning to you. Please understand that not all results are received at the same time and often the doctors may need to interpret multiple results in order to provide you with the best plan of care or course of treatment. Therefore, we ask that you please give us  2 business days to thoroughly  review all your results before contacting the office for clarification. Should we see a critical lab result, you will be contacted sooner.   If You Need Anything After Your Visit  If you have any questions or concerns for your doctor, please call our main line at [phone] If no one answers, please leave a voicemail as directed and we will return your call as soon as possible. Messages left after 4 pm will be answered the following business day.   You may also send us  a message via MyChart. We typically respond to MyChart messages within 1-2 business days.  For prescription refills, please ask your pharmacy to contact our office. Our fax number is [phone].  If you have an urgent issue when the clinic is closed that cannot wait until the next business day, you can page your doctor at the number below.    Please note that while we do our best to be available for urgent issues outside of office hours, we are not available 24/7.   If you have an urgent issue and are unable to reach us , you may choose to seek medical care at your doctor's office, retail clinic, urgent care center, or emergency room.  If you have a medical emergency, please immediately call 911 or go to the emergency department. In the event of inclement weather, please call our main line at [phone] for an update on the status of any delays or closures.  Dermatology Medication Tips: Please keep the boxes that  topical medications come in in order to help keep track of the instructions about where and how to use these. Pharmacies typically print the medication instructions only on the boxes and not directly on the medication tubes.   If your medication is too expensive, please contact our office at [phone] or send us  a message through MyChart.   We are unable to tell what your co-pay for medications will be in advance as this is different depending on your insurance coverage. However, we may be able to find a  substitute medication at lower cost or fill out paperwork to get insurance to cover a needed medication.   If a prior authorization is required to get your medication covered by your insurance company, please allow us  1-2 business days to complete this process.  Drug prices often vary depending on where the prescription is filled and some pharmacies may offer cheaper prices.  The website www.goodrx.com contains coupons for medications through different pharmacies. The prices here do not account for what the cost may be with help from insurance (it may be cheaper with your insurance), but the website can give you the price if you did not use any insurance.  - You can print the associated coupon and take it with your prescription to the pharmacy.  - You may also stop by our office during regular business hours and pick up a GoodRx coupon card.  - If you need your prescription sent electronically to a different pharmacy, notify our office through [facility] or by phone at [phone]     "

## 2024-10-19 NOTE — Progress Notes (Signed)
" ° °  Follow-Up Visit   Subjective  Sarah Prince is a 39 y.o. female who presents for the following: CCCA  Patient was last evaluated on 10/28/23.  At this visit patient was prescribed AA Gel (Minoxidil 10% and clobetasol 0.05%) compound Recommended taking Viviscal supplement and Collagen supplement Patient reports sxs are better and has noticed hair growth and areas filling in Patient reports she uses AA Gel daily Patient denies medication changes.  The following portions of the chart were reviewed this encounter and updated as appropriate: medications, allergies, medical history  Review of Systems:  No other skin or systemic complaints except as noted in HPI or Assessment and Plan.  Objective  Well appearing patient in no apparent distress; mood and affect are within normal limits.   A focused examination was performed of the following areas: Scalp   Relevant exam findings are noted in the Assessment and Plan.    Assessment & Plan   CCCA and Androgenetic alopecia Hair thinning due to scarring and hormonal changes, common in women aged 35-40. Current treatment includes minoxidil and clobetasol, with improvement in tenderness. Additional treatment needed to block DHT receptors to prevent further thinning. - Added finasteride to the compound  - Switched to a liquid solution for better application. - Instructed to apply drops to both front and back areas.  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 Treatment options include retinol to slow growth and cosmetic removal procedures. Removal procedures may result in flat hyperpigmented spots requiring lightening cream. - Recommended using Rock retinol cream for the neck and face. - Prescribed tretinoin  0.025% cream for the back, to be mixed with lotion and applied every other night. - Discussed cosmetic removal options, including  scraping or burning, with associated costs and potential for hyperpigmentation.     ACNE VULGARIS   This Visit - tretinoin  (RETIN-A ) 0.025 % cream - Apply topically at bedtime. Apply to face every other night CENTRAL CENTRIFUGAL CICATRICIAL ALOPECIA   Existing Treatments - Safety Seal Miscellaneous MISC - AA Gel with minoxidil USP 10% clobetasol USP 0.05% - apply to affected areas daily every morning  Return for CCCA f/u.  LILLETTE Lyle Cords, am acting as a neurosurgeon for [employer], DO .   Documentation: I have reviewed the above documentation for accuracy and completeness, and I agree with the above.  Delon Lenis, DO   "

## 2025-08-02 ENCOUNTER — Ambulatory Visit: Admitting: Dermatology
